# Patient Record
Sex: Male | Born: 1950 | Race: Black or African American | Hispanic: No | Smoking: Never smoker
Health system: Southern US, Community
[De-identification: ages and names within clinical notes are randomized; demographics above are authoritative.]

## PROBLEM LIST (undated history)

## (undated) DIAGNOSIS — I509 Heart failure, unspecified: Secondary | ICD-10-CM

## (undated) DIAGNOSIS — I10 Essential (primary) hypertension: Secondary | ICD-10-CM

## (undated) DIAGNOSIS — I4891 Unspecified atrial fibrillation: Secondary | ICD-10-CM

## (undated) DIAGNOSIS — E119 Type 2 diabetes mellitus without complications: Secondary | ICD-10-CM

## (undated) DIAGNOSIS — I5020 Unspecified systolic (congestive) heart failure: Secondary | ICD-10-CM

## (undated) DIAGNOSIS — I499 Cardiac arrhythmia, unspecified: Secondary | ICD-10-CM

---

## 1999-05-23 ENCOUNTER — Emergency Department (HOSPITAL_COMMUNITY): Admission: EM | Admit: 1999-05-23 | Discharge: 1999-05-23 | Payer: Self-pay | Admitting: Emergency Medicine

## 2005-08-13 ENCOUNTER — Emergency Department (HOSPITAL_COMMUNITY): Admission: EM | Admit: 2005-08-13 | Discharge: 2005-08-13 | Payer: Self-pay | Admitting: Family Medicine

## 2007-02-18 ENCOUNTER — Inpatient Hospital Stay (HOSPITAL_COMMUNITY): Admission: EM | Admit: 2007-02-18 | Discharge: 2007-02-22 | Payer: Self-pay | Admitting: Emergency Medicine

## 2007-02-22 ENCOUNTER — Inpatient Hospital Stay (HOSPITAL_COMMUNITY): Admission: AD | Admit: 2007-02-22 | Discharge: 2007-02-27 | Payer: Self-pay | Admitting: Psychiatry

## 2007-02-22 ENCOUNTER — Ambulatory Visit: Payer: Self-pay | Admitting: Psychiatry

## 2008-02-04 ENCOUNTER — Emergency Department (HOSPITAL_COMMUNITY): Admission: AC | Admit: 2008-02-04 | Discharge: 2008-02-04 | Payer: Self-pay

## 2008-02-05 ENCOUNTER — Ambulatory Visit: Payer: Self-pay | Admitting: Psychiatry

## 2008-02-05 ENCOUNTER — Inpatient Hospital Stay (HOSPITAL_COMMUNITY): Admission: AD | Admit: 2008-02-05 | Discharge: 2008-03-01 | Payer: Self-pay | Admitting: Psychiatry

## 2008-03-13 ENCOUNTER — Emergency Department (HOSPITAL_COMMUNITY): Admission: EM | Admit: 2008-03-13 | Discharge: 2008-03-13 | Payer: Self-pay | Admitting: Emergency Medicine

## 2008-03-17 ENCOUNTER — Emergency Department (HOSPITAL_COMMUNITY): Admission: EM | Admit: 2008-03-17 | Discharge: 2008-03-17 | Payer: Self-pay | Admitting: Emergency Medicine

## 2008-04-12 ENCOUNTER — Emergency Department (HOSPITAL_COMMUNITY): Admission: EM | Admit: 2008-04-12 | Discharge: 2008-04-12 | Payer: Self-pay | Admitting: Emergency Medicine

## 2008-04-24 ENCOUNTER — Emergency Department (HOSPITAL_COMMUNITY): Admission: EM | Admit: 2008-04-24 | Discharge: 2008-04-24 | Payer: Self-pay | Admitting: Emergency Medicine

## 2008-04-27 ENCOUNTER — Emergency Department (HOSPITAL_COMMUNITY): Admission: EM | Admit: 2008-04-27 | Discharge: 2008-04-27 | Payer: Self-pay | Admitting: Emergency Medicine

## 2008-04-30 ENCOUNTER — Emergency Department (HOSPITAL_COMMUNITY): Admission: EM | Admit: 2008-04-30 | Discharge: 2008-04-30 | Payer: Self-pay | Admitting: Emergency Medicine

## 2008-05-12 ENCOUNTER — Emergency Department (HOSPITAL_COMMUNITY): Admission: EM | Admit: 2008-05-12 | Discharge: 2008-05-12 | Payer: Self-pay | Admitting: Emergency Medicine

## 2008-05-25 ENCOUNTER — Emergency Department (HOSPITAL_COMMUNITY): Admission: EM | Admit: 2008-05-25 | Discharge: 2008-05-25 | Payer: Self-pay | Admitting: Emergency Medicine

## 2008-06-02 ENCOUNTER — Emergency Department (HOSPITAL_COMMUNITY): Admission: EM | Admit: 2008-06-02 | Discharge: 2008-06-02 | Payer: Self-pay | Admitting: Emergency Medicine

## 2008-06-06 ENCOUNTER — Ambulatory Visit: Payer: Self-pay | Admitting: Internal Medicine

## 2008-06-07 ENCOUNTER — Ambulatory Visit: Payer: Self-pay | Admitting: Internal Medicine

## 2008-06-10 ENCOUNTER — Ambulatory Visit: Payer: Self-pay | Admitting: Internal Medicine

## 2008-06-11 ENCOUNTER — Ambulatory Visit: Payer: Self-pay | Admitting: *Deleted

## 2008-06-11 ENCOUNTER — Emergency Department (HOSPITAL_COMMUNITY): Admission: EM | Admit: 2008-06-11 | Discharge: 2008-06-11 | Payer: Self-pay | Admitting: Emergency Medicine

## 2008-06-14 ENCOUNTER — Ambulatory Visit: Payer: Self-pay | Admitting: Internal Medicine

## 2008-06-26 ENCOUNTER — Ambulatory Visit: Payer: Self-pay | Admitting: Internal Medicine

## 2008-07-03 ENCOUNTER — Ambulatory Visit: Payer: Self-pay | Admitting: Internal Medicine

## 2008-07-31 ENCOUNTER — Ambulatory Visit: Payer: Self-pay | Admitting: Internal Medicine

## 2008-08-21 ENCOUNTER — Ambulatory Visit: Payer: Self-pay | Admitting: Internal Medicine

## 2008-11-20 ENCOUNTER — Ambulatory Visit: Payer: Self-pay | Admitting: Internal Medicine

## 2008-11-20 LAB — CONVERTED CEMR LAB
AST: 21 units/L (ref 0–37)
Albumin: 4.1 g/dL (ref 3.5–5.2)
Amphetamine Screen, Ur: NEGATIVE
Benzodiazepines.: NEGATIVE
Calcium: 9.2 mg/dL (ref 8.4–10.5)
Cholesterol: 155 mg/dL (ref 0–200)
Cocaine Metabolites: NEGATIVE
Glucose, Bld: 271 mg/dL — ABNORMAL HIGH (ref 70–99)
Marijuana Metabolite: NEGATIVE
Methadone: NEGATIVE
Microalb, Ur: 3.34 mg/dL — ABNORMAL HIGH (ref 0.00–1.89)
Opiate Screen, Urine: NEGATIVE
Phencyclidine (PCP): NEGATIVE
Sodium: 137 meq/L (ref 135–145)

## 2008-11-27 ENCOUNTER — Ambulatory Visit: Payer: Self-pay | Admitting: Internal Medicine

## 2008-12-30 ENCOUNTER — Ambulatory Visit: Payer: Self-pay | Admitting: Internal Medicine

## 2009-01-06 ENCOUNTER — Encounter (INDEPENDENT_AMBULATORY_CARE_PROVIDER_SITE_OTHER): Payer: Self-pay | Admitting: Family Medicine

## 2009-01-06 ENCOUNTER — Ambulatory Visit: Payer: Self-pay | Admitting: Internal Medicine

## 2009-01-10 ENCOUNTER — Ambulatory Visit: Payer: Self-pay | Admitting: Internal Medicine

## 2010-08-31 LAB — POCT I-STAT, CHEM 8
BUN: 17 mg/dL (ref 6–23)
Calcium, Ion: 1.16 mmol/L (ref 1.12–1.32)
Chloride: 104 mEq/L (ref 96–112)
Glucose, Bld: 183 mg/dL — ABNORMAL HIGH (ref 70–99)
Hemoglobin: 12.9 g/dL — ABNORMAL LOW (ref 13.0–17.0)
Potassium: 4 mEq/L (ref 3.5–5.1)
Sodium: 139 mEq/L (ref 135–145)

## 2010-08-31 LAB — URINALYSIS, ROUTINE W REFLEX MICROSCOPIC
Glucose, UA: 100 mg/dL — AB
Hgb urine dipstick: NEGATIVE
Leukocytes, UA: NEGATIVE
Nitrite: NEGATIVE
Protein, ur: 30 mg/dL — AB
Urobilinogen, UA: 0.2 mg/dL (ref 0.0–1.0)
pH: 5.5 (ref 5.0–8.0)

## 2010-08-31 LAB — DIFFERENTIAL
Basophils Relative: 1 % (ref 0–1)
Basophils Relative: 1 % (ref 0–1)
Eosinophils Absolute: 0 10*3/uL (ref 0.0–0.7)
Eosinophils Relative: 0 % (ref 0–5)
Eosinophils Relative: 1 % (ref 0–5)
Lymphocytes Relative: 37 % (ref 12–46)
Monocytes Absolute: 0.5 10*3/uL (ref 0.1–1.0)
Monocytes Absolute: 0.5 10*3/uL (ref 0.1–1.0)
Monocytes Absolute: 0.4 10*3/uL (ref 0.1–1.0)
Monocytes Relative: 11 % (ref 3–12)
Monocytes Relative: 9 % (ref 3–12)
Neutro Abs: 3.2 10*3/uL (ref 1.7–7.7)
Neutrophils Relative %: 65 % (ref 43–77)
Neutrophils Relative %: 69 % (ref 43–77)

## 2010-08-31 LAB — CBC
HCT: 36 % — ABNORMAL LOW (ref 39.0–52.0)
Hemoglobin: 12 g/dL — ABNORMAL LOW (ref 13.0–17.0)
MCHC: 33.4 g/dL (ref 30.0–36.0)
MCHC: 33.6 g/dL (ref 30.0–36.0)
MCV: 86.9 fL (ref 78.0–100.0)
MCV: 85.8 fL (ref 78.0–100.0)
Platelets: 182 10*3/uL (ref 150–400)
Platelets: 203 10*3/uL (ref 150–400)
RBC: 4.42 MIL/uL (ref 4.22–5.81)
RBC: 4.14 MIL/uL — ABNORMAL LOW (ref 4.22–5.81)
RDW: 13.9 % (ref 11.5–15.5)

## 2010-08-31 LAB — COMPREHENSIVE METABOLIC PANEL
ALT: 27 U/L (ref 0–53)
ALT: 32 U/L (ref 0–53)
Alkaline Phosphatase: 84 U/L (ref 39–117)
Alkaline Phosphatase: 89 U/L (ref 39–117)
CO2: 25 mEq/L (ref 19–32)
CO2: 24 mEq/L (ref 19–32)
Calcium: 9.4 mg/dL (ref 8.4–10.5)
Chloride: 103 mEq/L (ref 96–112)
Chloride: 106 mEq/L (ref 96–112)
GFR calc Af Amer: >60 mL/min (ref >60)
Glucose, Bld: 250 mg/dL — ABNORMAL HIGH (ref 70–99)
Glucose, Bld: 214 mg/dL — ABNORMAL HIGH (ref 70–99)
Potassium: 3.9 mEq/L (ref 3.5–5.1)
Sodium: 136 mEq/L (ref 135–145)
Sodium: 138 mEq/L (ref 135–145)
Total Bilirubin: 0.8 mg/dL (ref 0.3–1.2)
Total Protein: 7.1 g/dL (ref 6.0–8.3)

## 2010-08-31 LAB — URINE MICROSCOPIC-ADD ON

## 2010-08-31 LAB — GLUCOSE, CAPILLARY: Glucose-Capillary: 252 mg/dL — ABNORMAL HIGH (ref 70–99)
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
## 2010-09-29 NOTE — Discharge Summary (Signed)
NAMECARNIE, [name]                  ACCOUNT NO.:  [id number]   MEDICAL RECORD NO.:  [id number]          PATIENT TYPE:  INP   LOCATION:  [id number]                         FACILITY:  [facility]   PHYSICIAN:  Neysa Bonito, MD  DATE OF BIRTH:  [birth year]   DATE OF ADMISSION:  02/18/2007  DATE OF DISCHARGE:  02/21/2007                               DISCHARGE SUMMARY   PRIMARY CARE PHYSICIAN:  Unassigned.   CHIEF COMPLAINT:  Hyperglycemia.   HISTORY OF PRESENT ILLNESS:  Please refer to the H&P dictated by  Mobolaji B. Maia Petties, M.D.,  on the day of admission.   HOSPITAL STAY:  Problem 1.  SUICIDAL IDEATION:  The patient was seen and evaluated by  Dr. Felizardo Hoffmann and he was deemed not suicidal, so the patient is  cleared from psychiatry to be discharged.   Problem 2.  HYPERGLYCEMIA:  The patient was found to have hyperglycemia  on lab test.  Actually, the patient's hemoglobin A1c is pain 10.5 at  this time.  The patient was started on Actos on the day of admission and  I Vidal continue Actos on the discharge.  I discussed with him the  importance of following up with the primary physician for further  management of his diabetes, and he is aware and agreed to the idea of  following up with a primary physician.  That was challenging because the  patient lives in a shelter home, [facility], and he stated he wanted  to go back to [facility], though assisted-living facility was offered  as an option for his discharge considering now he needs more medical  care and maybe assistance with his medication and his health.  But the  patient refused time and time again to be discharged to anywhere except  to [facility].  I discussed the case with the social worker and we  contacted [facility] to inform them of his new diagnosis of his  diabetes.   The patient [name] have a diabetic nurse educator before his discharge  today.   DISCHARGE DIAGNOSES:  1. New-onset diabetes.  2. Homelessness.   DISCHARGE MEDICATIONS:  Actos 30 mg p.o. daily.   DISCHARGE PLAN:  The [name] be discharged to a shelter today.  He  is agreed and aware of the need of follow-up for further management of  his diabetes and he refused the assisted living facility discharge plan  at the current time.     Neysa Bonito, MD  Electronically Signed    EME/MEDQ  D:  02/21/2007  T:  02/21/2007  Job:  [id number]
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
## 2010-09-29 NOTE — Discharge Summary (Signed)
NAMEEMITERIO, NAU                  ACCOUNT NO.:  [id number]   MEDICAL RECORD NO.:  [id number]          PATIENT TYPE:  IPS   LOCATION:  [id number]                          [facility]  [facility]   PHYSICIAN:  Norm Salt, MD  DATE OF BIRTH:  [birth year]   DATE OF ADMISSION:  02/05/2008  DATE OF DISCHARGE:  03/01/2008                               DISCHARGE SUMMARY   IDENTIFYING DATA/REASON FOR ADMISSION:  This was an inpatient  psychiatric admission for [name], a 60 year old unmarried, homeless,  [name].  He was admitted to the emergency department,  where he presented with severe symptoms of psychosis and delusional  thinking.  Although a little history was available at the time of his  admission, he apparently had a history of chronic schizophrenia that had  been very longstanding.  Please refer to the admission note for further  details pertaining to the symptoms, circumstances and history that led  to his hospitalization.  He was given an initial Axis I diagnosis of  schizophrenia, chronic paranoid type, acute exacerbation.   MEDICAL/LABORATORY:  The patient was medically and physically assessed  by the psychiatric nurse practitioner.  He had a history of diabetes  mellitus, hypertension, and GERD.  He was continued on his usual regimen  of Actos, Protonix, aspirin, and a multivitamin.  There were no acute  medical issues during this inpatient stay.  His diabetic management was  overseen by the psychiatric nurse practitioner and the pharmacist.   HOSPITAL COURSE:  The patient was admitted to the adult inpatient  psychiatric service.  He presented as a well-nourished, normally-  developed adult male who initially was completely mute.  Although he was  alert, made eye contact well, and communicated through gestures, he made  absolutely no verbal responses.  For instance, when I asked him his name  he pointed to his ID bracelet.  He smiled and looked quite cheerful.  However, he  was clearly very guarded and suspicious.  He was treated  with a psychotropic regimen that included Haldol and Seroquel.  Over  several days, he gradually became less withdrawn, guarded, and more  verbal.  During the last 10 days of his hospital stay, he communicated  in a verbal fashion quite normally, and appeared to be completely non  delusional, with good reality testing, and insight into his illness and  need for treatment.   The patient's inpatient stay was lengthened by the challenges that we  found in disposition planning.  Because the patient had no form of  funding available, it was not possible to arrange for a group home or  assisted living situation, which was our first choice of plans.  The  patient had at times lived with his adult son, but the son and indicated  to [place] earlier in the patient's stay that he did not feel that he could  adequately manage the patient's resources, based upon the resources that  the son had available to him.  However, in the course of his treatment,  the patient cleared significantly, to a greater extent than I  believe  his family thought possible.  As such, his son was willing to take him  back to his home following his discharge.  He was discharged after  approximately 26 days of inpatient treatment.   AFTERCARE:  The patient was to follow up at the [facility], with an appointment on March 04, 2008.   DISCHARGE MEDICATIONS:  1. Haldol 20 mg nightly.  2. Seroquel 200 mg nightly.  3. Actos 30 mg daily.  4. Protonix 40 mg daily.  5. Aspirin 81 mg daily.  6. Multivitamin daily.   DISCHARGE DIAGNOSES:  Axis I:  Schizophrenia, chronic paranoid-type,  acute exacerbation, resolving.  Axis II:  Deferred.  Axis III:  History of diabetes, hypertension, gastroesophageal reflux  disease.  Axis IV:  Stressors severe.  Axis V: GAF on discharge 55.      Norm Salt, MD  Electronically Signed     SPB/MEDQ  D:   03/07/2008  T:  03/07/2008  Job:  [id number]
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
## 2010-09-29 NOTE — Consult Note (Signed)
NAMETROYCE, SWEE                  ACCOUNT NO.:  [id number]   MEDICAL RECORD NO.:  [id number]          PATIENT TYPE:  INP   LOCATION:  1509                         FACILITY:  [facility]   PHYSICIAN:  Felizardo Hoffmann, M.D.  DATE OF BIRTH:  [birth year]   DATE OF CONSULTATION:  02/21/2007  DATE OF DISCHARGE:                                 CONSULTATION   FOLLOWUP CONSULTATION   Mr. Lucatero began pacing up and down the hallway at a very rapid speed,  making occasional short 90-degree turns to the left and then back to the  right, keeping up his same direction.  Therapists trying to reach other  patients such as respiratory therapy and family members as well as  patients who were out in the hallway were at risk and intimidated.  He  was maintaining his pacing regardless of any attempt to redirect.  Just  prior to the beginning of the pacing, the patient had been given a form  describing a local general medical clinic for the indigent.  He was  holding the paper in his hands, staring at it as he was pacing.   The patient has been stating religious delusions.  He clearly is  demonstrating impaired judgment.  He continues grossly to be oriented.  He does indicate that he can remember events of the day.   MENTAL STATUS EXAM:  As above, the patient is alert.  His mood is very  expansive.  He is intrusive and imposing.  He Geno not respond to verbal  redirection back to his room.  His judgment is impaired.  Thought  process involves racing.  Thought content:  Grandiosity, religious  delusion.   ASSESSMENT:  1. 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  2. Rule out 296.80 bipolar disorder not otherwise specified, manic.   RECOMMENDATION:  1. In order to prevent harm and help the patient avoid potential      lethal self-neglect outside of the hospital (due to impaired      judgment), we Kayshawn order Haldol 5 mg with 4 mg intramuscularly, may      repeat x1.  2.  Johanthan petition for commitment to a psychiatric hospital once      medically cleared.  3. Low-stimulation ego support to establish a therapeutic alliance.  4. Would begin 5 mg Haldol p.o. or IM b.i.d. standing for      antipsychosis if this dosing of Haldol is agreed to by physician      for antipsychosis.  (The above dosing of Haldol 5 mg, 4 mg of      Ativan, is to reduce acute severe agitation and acute threat to      others as well as preventing the patient from exposing himself to      potential lethal self-neglect.  Therefore, that order Kevante not      require a second opinion acutely.)  5. Regarding a p.r.n. that Jobe be required for acute anti-      agitation/anticombativeness, would continue with Haldol 5 mg and  Ativan 3 mg IM q.2h. p.r.n.      Felizardo Hoffmann, M.D.  Electronically Signed    JW/MEDQ  D:  02/21/2007  T:  02/21/2007  Job:  [id number]
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
## 2010-10-02 NOTE — Discharge Summary (Signed)
NAMEBENET, KITCHELL                  ACCOUNT NO.:  [id number]   MEDICAL RECORD NO.:  [id number]          PATIENT TYPE:  IPS   LOCATION:  0404                          FACILITY:  [facility]   PHYSICIAN:  Norm Salt, MD  DATE OF BIRTH:  [birth year]   DATE OF ADMISSION:  02/22/2007  DATE OF DISCHARGE:  02/27/2007                               DISCHARGE SUMMARY   IDENTIFYING DATA/REASON FOR ADMISSION:  This was an inpatient  psychiatric admission for [name], a 60 year old African-American male,  homeless, who presented to the emergency department last week and was  admitted for treatment and evaluation of premature ventricular  contractions and uncontrolled diabetes.  While there, abnormal behavior  was noted, and the psychiatric consultant was called in.  The patient  had been anxious, pacing, had racing thoughts, religious delusions, and  was intimidating others in the medical hospital.  Please refer to the  admission note for further details pertaining to the symptoms,  circumstances and history that led to his hospitalization.   INITIAL DIAGNOSTIC IMPRESSION:  He was given an initial AXIS I diagnosis  of rule out schizophrenia, paranoid type.   MEDICAL/LABORATORY:  The patient was medically cleared at the hospital  prior to transfer to his inpatient stay with [place].  He was again reviewed  by the psychiatric nurse practitioner upon admission and continued on a  regimen of Actos 30 mg daily, Protonix 40 mg daily, aspirin 81 mg daily,  and an insulin regimen that was monitored by the pharmacist and the  nurse practitioner throughout his stay.  There were no acute medical  issues.   HOSPITAL COURSE:  The patient presented as a well-nourished, well-  developed male who was superficially pleasant and relaxed.  He was not  oriented to his situation, although he was oriented to person, place and  date.  When it was explained to him that this was a psychiatric unit, he  indicated that he had no  idea why he had been brought here.  He denied  any psychiatric history whatsoever, and denied any and all medical  problems.  He made no delusional statements, but the above denials  indicated extreme impairment, or at the very least, extreme guardedness,  or possibly conscious withholding of information.   The patient was placed on a trial of Haldol 5 mg b.i.d.  He was involved  in the therapeutic milieu.  He continued pleasant and cooperative  throughout his stay, and was a reasonably good participant in the  treatment program.  He never demonstrated any overt psychotic behaviors  or thoughts or statements.  It was felt that his insight and judgment  continued impaired.   Sleeping and eating stabilized rapidly.  We learned that he had been  staying at the homeless shelter, the [facility].  After the sixth  hospital day, the patient appeared quite stable, without any overt signs  or symptoms of psychosis, was pleasant, cooperative with medication, and  agreed to the following aftercare plan.   AFTERCARE:  The patient was to follow up at the [facility] with an  appointment on February 28, 2007 with their psychiatrist.  He was to  return to the shelter.   DISCHARGE MEDICATIONS:  1. Haldol 5 mg b.i.d.  2. Actos 30 mg daily.  3. Protonix 40 mg daily.  4. Aspirin 81 mg daily.  5. Insulin regimen as before.   DISCHARGE DIAGNOSES:  AXIS I:  Schizophrenia, chronic, paranoid-type,  acute exacerbation, resolving.  AXIS II:  Deferred.  AXIS III:  History of gastroesophageal reflux disease, coronary artery  disease, insulin-dependent diabetes mellitus.  AXIS IV:  Stressors:  Severe.  AXIS V:  GAF on discharge 55.      Norm Salt, MD  Electronically Signed     SPB/MEDQ  D:  02/28/2007  T:  02/28/2007  Job:  [id number]

## 2011-02-15 LAB — URINALYSIS, ROUTINE W REFLEX MICROSCOPIC
Glucose, UA: 500 — AB
Hgb urine dipstick: NEGATIVE
Ketones, ur: NEGATIVE
Leukocytes, UA: NEGATIVE
Leukocytes, UA: NEGATIVE
Nitrite: NEGATIVE
Protein, ur: 30 — AB
Protein, ur: NEGATIVE
Specific Gravity, Urine: 1.017
Urobilinogen, UA: 0.2
pH: 6

## 2011-02-15 LAB — ETHANOL: Alcohol, Ethyl (B): <5

## 2011-02-15 LAB — CBC
RBC: 4.17 — ABNORMAL LOW
WBC: 6.6

## 2011-02-15 LAB — RAPID URINE DRUG SCREEN, HOSP PERFORMED
Amphetamines: NOT DETECTED
Barbiturates: NOT DETECTED
Cocaine: NOT DETECTED
Opiates: NOT DETECTED
Tetrahydrocannabinol: NOT DETECTED

## 2011-02-15 LAB — POCT I-STAT, CHEM 8
Calcium, Ion: 1.09 — ABNORMAL LOW
Chloride: 106
Creatinine, Ser: 1.1
Glucose, Bld: 215 — ABNORMAL HIGH
HCT: 37 — ABNORMAL LOW

## 2011-02-15 LAB — DIFFERENTIAL
Basophils Relative: 0
Lymphs Abs: 1.2
Monocytes Relative: 9
Neutro Abs: 4.8
Neutrophils Relative %: 73

## 2011-02-15 LAB — URINE MICROSCOPIC-ADD ON

## 2011-02-15 LAB — GLUCOSE, CAPILLARY: Glucose-Capillary: 249 — ABNORMAL HIGH

## 2011-02-16 LAB — GLUCOSE, CAPILLARY
Glucose-Capillary: 142 — ABNORMAL HIGH
Glucose-Capillary: 146 — ABNORMAL HIGH
Glucose-Capillary: 146 — ABNORMAL HIGH
Glucose-Capillary: 150 — ABNORMAL HIGH
Glucose-Capillary: 151 — ABNORMAL HIGH
Glucose-Capillary: 152 — ABNORMAL HIGH
Glucose-Capillary: 164 — ABNORMAL HIGH
Glucose-Capillary: 169 — ABNORMAL HIGH
Glucose-Capillary: 175 — ABNORMAL HIGH
Glucose-Capillary: 177 — ABNORMAL HIGH
Glucose-Capillary: 181 — ABNORMAL HIGH
Glucose-Capillary: 184 — ABNORMAL HIGH
Glucose-Capillary: 186 — ABNORMAL HIGH
Glucose-Capillary: 187 — ABNORMAL HIGH
Glucose-Capillary: 187 — ABNORMAL HIGH
Glucose-Capillary: 200 — ABNORMAL HIGH
Glucose-Capillary: 201 — ABNORMAL HIGH
Glucose-Capillary: 203 — ABNORMAL HIGH
Glucose-Capillary: 205 — ABNORMAL HIGH
Glucose-Capillary: 214 — ABNORMAL HIGH
Glucose-Capillary: 219 — ABNORMAL HIGH
Glucose-Capillary: 224 — ABNORMAL HIGH
Glucose-Capillary: 224 — ABNORMAL HIGH
Glucose-Capillary: 227 — ABNORMAL HIGH
Glucose-Capillary: 245 — ABNORMAL HIGH
Glucose-Capillary: 256 — ABNORMAL HIGH
Glucose-Capillary: 257 — ABNORMAL HIGH
Glucose-Capillary: 261 — ABNORMAL HIGH
Glucose-Capillary: 263 — ABNORMAL HIGH
Glucose-Capillary: 265 — ABNORMAL HIGH
Glucose-Capillary: 299 — ABNORMAL HIGH
Glucose-Capillary: 307 — ABNORMAL HIGH
Glucose-Capillary: 319 — ABNORMAL HIGH
Glucose-Capillary: 342 — ABNORMAL HIGH
Glucose-Capillary: 363 — ABNORMAL HIGH
Glucose-Capillary: 209 — ABNORMAL HIGH

## 2011-02-16 LAB — COMPREHENSIVE METABOLIC PANEL
ALT: 29
AST: 27
CO2: 27
Calcium: 9.4
GFR calc Af Amer: >60
Potassium: 3.7
Sodium: 138
Total Protein: 7

## 2011-02-16 LAB — URINALYSIS, ROUTINE W REFLEX MICROSCOPIC
Glucose, UA: 500 — AB
Glucose, UA: NEGATIVE
Ketones, ur: NEGATIVE
Nitrite: NEGATIVE
Nitrite: NEGATIVE
Protein, ur: NEGATIVE
Specific Gravity, Urine: 1.013
Urobilinogen, UA: 0.2
pH: 6

## 2011-02-16 LAB — DIFFERENTIAL
Eosinophils Absolute: 0.1
Eosinophils Relative: 1
Lymphs Abs: 1.8
Monocytes Absolute: 0.5
Monocytes Relative: 10

## 2011-02-16 LAB — ETHANOL: Alcohol, Ethyl (B): <5

## 2011-02-16 LAB — POCT I-STAT, CHEM 8
BUN: 7
Potassium: 3.8
Sodium: 139
TCO2: 26

## 2011-02-16 LAB — RAPID URINE DRUG SCREEN, HOSP PERFORMED
Barbiturates: NOT DETECTED
Benzodiazepines: NOT DETECTED

## 2011-02-16 LAB — CBC
MCHC: 33.6
RBC: 4.3
RDW: 13.9

## 2011-02-19 LAB — URINALYSIS, ROUTINE W REFLEX MICROSCOPIC
Nitrite: NEGATIVE
Specific Gravity, Urine: 1.015 (ref 1.005–1.030)
Urobilinogen, UA: 0.2 mg/dL (ref 0.0–1.0)
pH: 5.5 (ref 5.0–8.0)

## 2011-02-19 LAB — DIFFERENTIAL
Basophils Absolute: 0 10*3/uL (ref 0.0–0.1)
Basophils Relative: 0 % (ref 0–1)
Eosinophils Absolute: 0 10*3/uL (ref 0.0–0.7)
Eosinophils Relative: 0 % (ref 0–5)
Lymphocytes Relative: 11 % — ABNORMAL LOW (ref 12–46)
Lymphs Abs: 0.7 10*3/uL (ref 0.7–4.0)
Lymphs Abs: 1 10*3/uL (ref 0.7–4.0)
Monocytes Relative: 8 % (ref 3–12)
Neutro Abs: 6.8 10*3/uL (ref 1.7–7.7)
Neutrophils Relative %: 77 % (ref 43–77)

## 2011-02-19 LAB — CBC
HCT: 37.8 % — ABNORMAL LOW (ref 39.0–52.0)
HCT: 38.2 % — ABNORMAL LOW (ref 39.0–52.0)
MCHC: 33.4 g/dL (ref 30.0–36.0)
MCV: 87.6 fL (ref 78.0–100.0)
Platelets: 173 10*3/uL (ref 150–400)
Platelets: 235 10*3/uL (ref 150–400)
RDW: 13.5 % (ref 11.5–15.5)
WBC: 5.7 10*3/uL (ref 4.0–10.5)
WBC: 8.9 10*3/uL (ref 4.0–10.5)

## 2011-02-19 LAB — POCT CARDIAC MARKERS
CKMB, poc: 1.8 ng/mL (ref 1.0–8.0)
Myoglobin, poc: 228 ng/mL (ref 12–200)
Troponin i, poc: <0.05 ng/mL (ref 0.00–0.09)

## 2011-02-19 LAB — POCT I-STAT, CHEM 8
BUN: 12 mg/dL (ref 6–23)
Chloride: 101 mEq/L (ref 96–112)
Creatinine, Ser: 1.2 mg/dL (ref 0.4–1.5)
Potassium: 3.9 mEq/L (ref 3.5–5.1)
Sodium: 137 mEq/L (ref 135–145)
TCO2: 27 mmol/L (ref 0–100)

## 2011-02-19 LAB — RAPID URINE DRUG SCREEN, HOSP PERFORMED
Amphetamines: NOT DETECTED
Tetrahydrocannabinol: NOT DETECTED

## 2011-02-19 LAB — GLUCOSE, CAPILLARY: Glucose-Capillary: 294 mg/dL — ABNORMAL HIGH (ref 70–99)

## 2011-02-19 LAB — BASIC METABOLIC PANEL
BUN: 7 mg/dL (ref 6–23)
CO2: 27 mEq/L (ref 19–32)
Chloride: 103 mEq/L (ref 96–112)
Potassium: 4.1 mEq/L (ref 3.5–5.1)

## 2011-02-19 LAB — ETHANOL: Alcohol, Ethyl (B): <5 mg/dL (ref 0–10)

## 2011-02-25 LAB — LIPID PANEL
Cholesterol: 132
HDL: 41

## 2011-02-25 LAB — CBC
HCT: 35.3 — ABNORMAL LOW
HCT: 37.8 — ABNORMAL LOW
Hemoglobin: 12.9 — ABNORMAL LOW
MCHC: 34.2
MCV: 84.3
Platelets: 174
Platelets: 204
RBC: 4.53
WBC: 3.5 — ABNORMAL LOW
WBC: 7.1

## 2011-02-25 LAB — CK TOTAL AND CKMB (NOT AT ARMC)
CK, MB: 2
CK, MB: 2.4
Relative Index: 0.5
Total CK: 501 — ABNORMAL HIGH
Total CK: 533 — ABNORMAL HIGH

## 2011-02-25 LAB — URINALYSIS, ROUTINE W REFLEX MICROSCOPIC
Leukocytes, UA: NEGATIVE
Nitrite: NEGATIVE
Protein, ur: NEGATIVE
Urobilinogen, UA: 0.2

## 2011-02-25 LAB — COMPREHENSIVE METABOLIC PANEL
Albumin: 3.2 — ABNORMAL LOW
Albumin: 3.8
Alkaline Phosphatase: 128 — ABNORMAL HIGH
BUN: 5 — ABNORMAL LOW
BUN: 8
CO2: 29
Calcium: 8.9
Chloride: 104
Chloride: 105
Creatinine, Ser: 0.95
GFR calc non Af Amer: >60
Potassium: 3.9
Total Bilirubin: 0.6
Total Bilirubin: 0.7

## 2011-02-25 LAB — DIFFERENTIAL
Basophils Absolute: 0
Basophils Relative: 0
Eosinophils Relative: 0
Monocytes Absolute: 0.5
Neutro Abs: 5.8

## 2011-02-25 LAB — ETHANOL: Alcohol, Ethyl (B): <5

## 2011-02-25 LAB — DRUGS OF ABUSE SCREEN W/O ALC, ROUTINE URINE
Amphetamine Screen, Ur: NEGATIVE
Barbiturate Quant, Ur: NEGATIVE
Marijuana Metabolite: NEGATIVE
Propoxyphene: NEGATIVE

## 2011-02-25 LAB — RAPID URINE DRUG SCREEN, HOSP PERFORMED: Benzodiazepines: NOT DETECTED

## 2011-02-25 LAB — TSH: TSH: 2.156

## 2011-02-25 LAB — URINE MICROSCOPIC-ADD ON: Urine-Other: NONE SEEN

## 2011-02-25 LAB — TROPONIN I: Troponin I: 0.03

## 2013-12-20 ENCOUNTER — Encounter (HOSPITAL_COMMUNITY): Payer: Self-pay | Admitting: Emergency Medicine

## 2013-12-20 ENCOUNTER — Emergency Department (HOSPITAL_COMMUNITY)
Admission: EM | Admit: 2013-12-20 | Discharge: 2013-12-20 | Disposition: A | Discharge status: Home / Self Care | Payer: Self-pay | Attending: Emergency Medicine | Admitting: Emergency Medicine

## 2013-12-20 DIAGNOSIS — I10 Essential (primary) hypertension: Secondary | ICD-10-CM | POA: Insufficient documentation

## 2013-12-20 DIAGNOSIS — R51 Headache: Secondary | ICD-10-CM | POA: Insufficient documentation

## 2013-12-20 DIAGNOSIS — E119 Type 2 diabetes mellitus without complications: Secondary | ICD-10-CM | POA: Insufficient documentation

## 2013-12-20 DIAGNOSIS — Z79899 Other long term (current) drug therapy: Secondary | ICD-10-CM | POA: Insufficient documentation

## 2013-12-20 DIAGNOSIS — IMO0002 Reserved for concepts with insufficient information to code with codable children: Secondary | ICD-10-CM

## 2013-12-20 DIAGNOSIS — E1165 Type 2 diabetes mellitus with hyperglycemia: Secondary | ICD-10-CM

## 2013-12-20 LAB — CBC WITH DIFFERENTIAL/PLATELET
BASOS PCT: 0 % (ref 0–1)
Basophils Absolute: 0 10*3/uL (ref 0.0–0.1)
EOS ABS: 0 10*3/uL (ref 0.0–0.7)
EOS PCT: 0 % (ref 0–5)
HCT: 36.5 % — ABNORMAL LOW (ref 39.0–52.0)
Hemoglobin: 12.4 g/dL — ABNORMAL LOW (ref 13.0–17.0)
Lymphocytes Relative: 25 % (ref 12–46)
Lymphs Abs: 1.1 10*3/uL (ref 0.7–4.0)
MCH: 28.8 pg (ref 26.0–34.0)
MCHC: 34 g/dL (ref 30.0–36.0)
MCV: 84.9 fL (ref 78.0–100.0)
Monocytes Absolute: 0.5 10*3/uL (ref 0.1–1.0)
Monocytes Relative: 12 % (ref 3–12)
NEUTROS PCT: 63 % (ref 43–77)
Neutro Abs: 2.8 10*3/uL (ref 1.7–7.7)
PLATELETS: 176 10*3/uL (ref 150–400)
RBC: 4.3 MIL/uL (ref 4.22–5.81)
RDW: 13 % (ref 11.5–15.5)
WBC: 4.4 10*3/uL (ref 4.0–10.5)

## 2013-12-20 LAB — URINALYSIS, ROUTINE W REFLEX MICROSCOPIC
Bilirubin Urine: NEGATIVE
HGB URINE DIPSTICK: NEGATIVE
KETONES UR: 15 mg/dL — AB
LEUKOCYTES UA: NEGATIVE
Nitrite: NEGATIVE
PROTEIN: 100 mg/dL — AB
Specific Gravity, Urine: 1.035 — ABNORMAL HIGH (ref 1.005–1.030)
UROBILINOGEN UA: 0.2 mg/dL (ref 0.0–1.0)
pH: 5 (ref 5.0–8.0)

## 2013-12-20 LAB — BASIC METABOLIC PANEL
Anion gap: 13 (ref 5–15)
BUN: 16 mg/dL (ref 6–23)
CALCIUM: 9.3 mg/dL (ref 8.4–10.5)
CO2: 24 mEq/L (ref 19–32)
Chloride: 99 mEq/L (ref 96–112)
Creatinine, Ser: 1.14 mg/dL (ref 0.50–1.35)
GFR, EST AFRICAN AMERICAN: 77 mL/min — AB (ref >90)
GFR, EST NON AFRICAN AMERICAN: 67 mL/min — AB (ref >90)
Glucose, Bld: 326 mg/dL — ABNORMAL HIGH (ref 70–99)
POTASSIUM: 4.3 meq/L (ref 3.7–5.3)
SODIUM: 136 meq/L — AB (ref 137–147)

## 2013-12-20 LAB — URINE MICROSCOPIC-ADD ON

## 2013-12-20 MED ORDER — HYDROCHLOROTHIAZIDE 12.5 MG PO TABS: 12.5000 mg | ORAL_TABLET | Freq: Every day | ORAL | Status: DC

## 2013-12-20 MED ORDER — HYDROCHLOROTHIAZIDE 25 MG PO TABS
25.0000 mg | ORAL_TABLET | Freq: Every day | ORAL | Status: DC
Start: 2013-12-20 — End: 2013-12-20
  Administered 2013-12-20: 25 mg via ORAL
  Filled 2013-12-20: qty 1

## 2013-12-20 MED ORDER — METFORMIN HCL 1000 MG PO TABS: 500.0000 mg | ORAL_TABLET | Freq: Two times a day (BID) | ORAL | Status: DC

## 2013-12-20 NOTE — Progress Notes (Signed)
  CARE MANAGEMENT ED NOTE 12/20/2013  Patient:  [name],[name]   Account Number:  [id number]  Date Initiated:  12/20/2013  Documentation initiated by:  Jackelyn Poling  Subjective/Objective Assessment:   63 yr old self pay [place] pt at [facility] ED on 12/20/13 Pt reports went to health dept and sent here due to hypertension. States off of BP meds for two years. Reports headache and upper back pain.     Subjective/Objective Assessment Detail:   [facility] ED PA requesting pcp assistance for the pt  Pt [facility]  Pt reports his preference is to get medicaid renew  Reports change of address to [address] [place] and reports DSS may not have his new address  Pt verified home number correct as [phone] so Cm may call him back     Action/Plan:   ED CM spoke with ED PA, Kaitlyn about need for a pcp for pt ED CM spoke with pt via cell phone Cm discussed opening of [facility] & no longer accepting new pts at this time   Action/Plan Detail:   ED CM completed a referral to [facility] staff for assist with orange card and to [facility] for possible new pt appointment CM placed information for [facility] and DSS in d/c instructions for pt Pending return calls/emails for follow up with pt   Anticipated DC Date:  12/20/2013     Status Recommendation to Physician:   Result of Recommendation:    Other ED [place]  Other  Outpatient Services - Pt [place] follow up  PCP issues  [facility] / [facility] (established/new)    Choice offered to / List presented to:            Status of service:  Completed, signed off  ED Comments:   ED Comments Detail:  Information entered in EPIC follow u p section fof d/c [employer] of Social services  Call on 12/20/2013  [place], [place] [address] [phone]   Please call DSS to give them your updated address so they can send you another card and assign a family  doctor partnership for care  Call A referral has been made for you to this [place] for community care network assists with discounted doctors through "orange card services Call Sylvie Farrier at [phone] [email or web address]

## 2013-12-20 NOTE — ED Provider Notes (Signed)
CSN: [id number]     Arrival date & time 12/20/13  1037 History   First MD Initiated Contact with Patient 12/20/13 1134     Chief Complaint  Patient presents with  . Hypertension  . Headache     (Consider location/radiation/quality/duration/timing/severity/associated sxs/prior Treatment) HPI Comments: Patient is a 63 year old male with no past medical history who presents from the health department due to elevated BP. Patient initially reported a headache but it has now resolved without intervention. Patient denies any symptoms at this time. He reports going to the health department because he was having some genital irritation from a recent sexual encounter. He reports being very worried and upset that the woman he was intimate with transferred this infection. He thinks this is causing his blood pressure to be elevated. Patient reports taking herbs for BP.   Patient is a 63 y.o. male presenting with hypertension and headaches. The history is provided by the patient. No language interpreter was used.  Hypertension This is a recurrent problem. The current episode started today. The problem occurs constantly. The problem has been unchanged. Associated symptoms include headaches. Pertinent negatives include no abdominal pain, arthralgias, chest pain, chills, fatigue, fever, nausea, neck pain, vomiting or weakness. Nothing aggravates the symptoms. He has tried nothing for the symptoms. The treatment provided no relief.  Headache Associated symptoms: no abdominal pain, no diarrhea, no dizziness, no fatigue, no fever, no nausea, no neck pain and no vomiting     History reviewed. No pertinent past medical history. History reviewed. No pertinent past surgical history. No family history on file. History  Substance Use Topics  . Smoking status: Never Smoker   . Smokeless tobacco: Not on file  . Alcohol Use: No    Review of Systems  Constitutional: Negative for fever, chills and fatigue.  HENT:  Negative for trouble swallowing.   Eyes: Negative for visual disturbance.  Respiratory: Negative for shortness of breath.   Cardiovascular: Negative for chest pain and palpitations.  Gastrointestinal: Negative for nausea, vomiting, abdominal pain and diarrhea.  Genitourinary: Negative for dysuria and difficulty urinating.  Musculoskeletal: Negative for arthralgias and neck pain.  Skin: Negative for color change.  Neurological: Positive for headaches. Negative for dizziness and weakness.  Psychiatric/Behavioral: Negative for dysphoric mood.      Allergies  Review of patient's allergies indicates no known allergies.  Home Medications   Prior to Admission medications   Medication Sig Start Date End Date Taking? Authorizing Provider  b complex vitamins tablet Take 1 tablet by mouth daily.   Yes Historical Provider, MD  Biotin 1000 MCG tablet Take 1,000 mcg by mouth daily.   Yes Historical Provider, MD  Cinnamon 500 MG capsule Take 1,000 mg by mouth daily.   Yes Historical Provider, MD  OVER THE COUNTER MEDICATION Take 1 packet by mouth daily. Diabetes packet of vitamins   Yes Historical Provider, MD  vitamin B-12 (CYANOCOBALAMIN) 1000 MCG tablet Take 1,000 mcg by mouth daily.   Yes Historical Provider, MD  vitamin E 400 UNIT capsule Take 400 Units by mouth daily.   Yes Historical Provider, MD   BP 175/99  Pulse 94  Temp(Src) 97.7 F (36.5 C) (Oral)  Resp 24  SpO2 97% Physical Exam  Nursing note and vitals reviewed. Constitutional: He is oriented to person, place, and time. He appears well-developed and well-nourished. No distress.  HENT:  Head: Normocephalic and atraumatic.  Eyes: Conjunctivae and EOM are normal.  Neck: Normal range of motion.  Cardiovascular:  Normal rate and regular rhythm.  Exam reveals no gallop and no friction rub.   No murmur heard. Pulmonary/Chest: Effort normal and breath sounds normal. He has no wheezes. He has no rales. He exhibits no tenderness.   Abdominal: Soft. He exhibits no distension. There is no tenderness. There is no rebound.  Musculoskeletal: Normal range of motion.  Neurological: He is alert and oriented to person, place, and time. No cranial nerve deficit. Coordination normal.  Speech is goal-oriented. Moves limbs without ataxia.   Skin: Skin is warm and dry.  Psychiatric: He has a normal mood and affect. His behavior is normal.    ED Course  Procedures (including critical care time) Labs Review Labs Reviewed  CBC WITH DIFFERENTIAL - Abnormal; Notable for the following:    Hemoglobin 12.4 (*)    HCT 36.5 (*)    All other components within normal limits  BASIC METABOLIC PANEL - Abnormal; Notable for the following:    Sodium 136 (*)    Glucose, Bld 326 (*)    GFR calc non Af Amer 67 (*)    GFR calc Af Amer 77 (*)    All other components within normal limits  URINALYSIS, ROUTINE W REFLEX MICROSCOPIC - Abnormal; Notable for the following:    Specific Gravity, Urine 1.035 (*)    Glucose, UA >1000 (*)    Ketones, ur 15 (*)    Protein, ur 100 (*)    All other components within normal limits  URINE MICROSCOPIC-ADD ON - Abnormal; Notable for the following:    Casts HYALINE CASTS (*)    All other components within normal limits    Imaging Review No results found.   EKG Interpretation None      MDM   Final diagnoses:  Essential hypertension  Diabetes mellitus type 2, uncontrolled    12:46 PM Patient reports relief of his headache since he's been in the ED. Labs and urinalysis pending. Patient's BP staying around 189/105. Patient Jerman be observed.   2:00 PM Patient's glucose elevated at 326 with anion gap of 13. Patient is asymptomatic at this time. No other labs changes noted. Patient spoke with case management who Harald help him get a PCP. Patient Uziel be started on Metformin and HCTZ. Patient Hriday be discharged with PCP follow up. Patient Shadrach have HCTZ here. Patient instructed to return with worsening  or concerning symptoms.     Alvina Chou, PA-C 12/20/13 [phone]

## 2013-12-20 NOTE — ED Provider Notes (Signed)
Medical screening examination/treatment/procedure(s) were performed by non-physician practitioner and as supervising physician I was immediately available for consultation/collaboration.   EKG Interpretation None        Wandra Arthurs, MD 12/20/13 1459

## 2013-12-20 NOTE — ED Notes (Signed)
Pt undressed, in gown, on monitor, continuous pulse oximetry and blood pressure cuff 

## 2013-12-20 NOTE — ED Notes (Signed)
Pt reports went to health dept and sent here due to hypertension. States off of BP meds for two years. Reports headache and upper back pain.

## 2013-12-20 NOTE — Discharge Instructions (Signed)
Take metformin daily as directed. Take HCTZ daily as directed. Follow up with a primary care provider for medication management. Refer to attached documents for more information.

## 2013-12-20 NOTE — ED Notes (Signed)
Pt aware of need of urine specimen; pt handed an urinal to use when he can

## 2013-12-21 NOTE — Progress Notes (Signed)
8/7/151630 ED Cm called and checked on pt He reports he had been to [facility] today and to [facility] He ws informed by DSS that his medicaid card would be issued to his new address in 7 days He was able to updated his address with [facility] and obtain an appointment for 8/19 15 Pt states he obtained he bp medicine at $4 but his metformin cost $23 He had to get help with the $23 dollars and go back on 12/21/13 to pick it up Pt states he does not have money for his other prn medicine but wanted to get his primary medicines  Reports also taking "garlic tablets" Reports a one time visit to Dr Kennon Holter ($56) but did not get a clear dx Report later on finding he had only a yeast infection and not an STD as confirmed by health department Discussed with pt www.needymed.org and goodrx.com but pt states he does not have access to internet and never been to [employer] for internet access Encouraged him to have [facility] to work with him on the cost of his medicine especially metformin He agreed to f/u [employer] of services and resources rendered

## 2015-08-02 ENCOUNTER — Emergency Department (HOSPITAL_COMMUNITY)
Admission: EM | Admit: 2015-08-02 | Discharge: 2015-08-02 | Disposition: A | Discharge status: Home / Self Care | Payer: Medicare Other | Attending: Emergency Medicine | Admitting: Emergency Medicine

## 2015-08-02 ENCOUNTER — Emergency Department (INDEPENDENT_AMBULATORY_CARE_PROVIDER_SITE_OTHER)
Admission: EM | Admit: 2015-08-02 | Discharge: 2015-08-02 | Disposition: A | Discharge status: Nursing Home (Includes ICF) | Payer: Medicare Other | Source: Home / Self Care | Attending: Family Medicine | Admitting: Family Medicine

## 2015-08-02 ENCOUNTER — Encounter (HOSPITAL_COMMUNITY): Payer: Self-pay | Admitting: Emergency Medicine

## 2015-08-02 DIAGNOSIS — Y9389 Activity, other specified: Secondary | ICD-10-CM | POA: Diagnosis not present

## 2015-08-02 DIAGNOSIS — Y9289 Other specified places as the place of occurrence of the external cause: Secondary | ICD-10-CM | POA: Insufficient documentation

## 2015-08-02 DIAGNOSIS — E119 Type 2 diabetes mellitus without complications: Secondary | ICD-10-CM | POA: Diagnosis not present

## 2015-08-02 DIAGNOSIS — Z79899 Other long term (current) drug therapy: Secondary | ICD-10-CM | POA: Insufficient documentation

## 2015-08-02 DIAGNOSIS — X58XXXA Exposure to other specified factors, initial encounter: Secondary | ICD-10-CM | POA: Diagnosis not present

## 2015-08-02 DIAGNOSIS — Y998 Other external cause status: Secondary | ICD-10-CM | POA: Diagnosis not present

## 2015-08-02 DIAGNOSIS — R220 Localized swelling, mass and lump, head: Secondary | ICD-10-CM | POA: Diagnosis present

## 2015-08-02 DIAGNOSIS — T783XXA Angioneurotic edema, initial encounter: Secondary | ICD-10-CM | POA: Diagnosis not present

## 2015-08-02 DIAGNOSIS — I10 Essential (primary) hypertension: Secondary | ICD-10-CM | POA: Insufficient documentation

## 2015-08-02 DIAGNOSIS — Z7984 Long term (current) use of oral hypoglycemic drugs: Secondary | ICD-10-CM | POA: Diagnosis not present

## 2015-08-02 HISTORY — DX: Essential (primary) hypertension: I10

## 2015-08-02 HISTORY — DX: Type 2 diabetes mellitus without complications: E11.9

## 2015-08-02 MED ORDER — DIPHENHYDRAMINE HCL 50 MG/ML IJ SOLN
INTRAMUSCULAR | Status: AC
  Filled 2015-08-02: qty 1

## 2015-08-02 MED ORDER — EPINEPHRINE HCL 1 MG/ML IJ SOLN
INTRAMUSCULAR | Status: AC
  Filled 2015-08-02: qty 1

## 2015-08-02 MED ORDER — METHYLPREDNISOLONE SODIUM SUCC 125 MG IJ SOLR
INTRAMUSCULAR | Status: AC
  Filled 2015-08-02: qty 2

## 2015-08-02 MED ORDER — EPINEPHRINE HCL 1 MG/ML IJ SOLN
0.3000 mg | Freq: Once | INTRAMUSCULAR | Status: AC
  Administered 2015-08-02: 0.3 mg via INTRAMUSCULAR

## 2015-08-02 MED ORDER — DIPHENHYDRAMINE HCL 50 MG/ML IJ SOLN
25.0000 mg | Freq: Once | INTRAMUSCULAR | Status: AC
  Administered 2015-08-02: 25 mg via INTRAMUSCULAR

## 2015-08-02 MED ORDER — METHYLPREDNISOLONE SODIUM SUCC 125 MG IJ SOLR
80.0000 mg | Freq: Once | INTRAMUSCULAR | Status: AC
  Administered 2015-08-02: 80 mg via INTRAMUSCULAR

## 2015-08-02 NOTE — Discharge Instructions (Signed)
It was nice seeing you today. I am sorry about your lip swelling. It could be that you are reacting to the beef soup you ate, i Griffin recommend hospital observation for at least 24 hours to ensure that your lip swelling is going down.   Angioedema Angioedema is sudden puffiness (swelling), often of the skin. It can happen:  On your face or privates (genitals).  In your belly (abdomen) or other body parts. It usually happens quickly and gets better in 1 or 2 days. It often starts at night and is found when you wake up. You may get red, itchy patches of skin (hives). Attacks can be dangerous if your breathing passages get puffy. The condition may happen only once, or it can come back at random times. It may happen for several years before it goes away for good. HOME CARE  Only take medicines as told by your doctor.  Always carry your emergency allergy medicines with you.  Wear a medical bracelet as told by your doctor.  Avoid things that you know [name] cause attacks (triggers). GET HELP IF:  You have another attack.  Your attacks happen more often or get worse.  The condition was passed to you by your parents and you want to have children. GET HELP RIGHT AWAY IF:   Your mouth, tongue, or lips are very puffy.  You have trouble breathing.  You have trouble swallowing.  You pass out (faint). MAKE SURE YOU:   Understand these instructions.  [name] watch your condition.  [name] get help right away if you are not doing well or get worse.   This information is not intended to replace advice given to you by your health care provider. Make sure you discuss any questions you have with your health care provider.   Document Released: 04/21/2009 Document Revised: 02/21/2013 Document Reviewed: 12/25/2012 Elsevier Interactive Patient Education [employer].

## 2015-08-02 NOTE — ED Notes (Signed)
Pt here with sudden allergic reaction to possible vegetable soup today Upper lip swelling, denies tongue numbness or swelling Airway and breathing intact Took Benadryl

## 2015-08-02 NOTE — ED Notes (Signed)
Epinephrine 80ml given IM- H/R 126-132, palpitation reported, denies chest pain Pt to be transferred to ER via EMS Report update given to [facility], charge RN

## 2015-08-02 NOTE — ED Provider Notes (Addendum)
CSN: [id number]     Arrival date & time 08/02/15  1554 History   First MD Initiated Contact with Patient 08/02/15 1732     No chief complaint on file.  (Consider location/radiation/quality/duration/timing/severity/associated sxs/prior Treatment) Patient is a 65 y.o. male presenting with allergic reaction. The history is provided by the patient. No language interpreter was used.  Allergic Reaction Presenting symptoms: swelling   Presenting symptoms: no difficulty breathing, no difficulty swallowing and no itching   Presenting symptoms comment:  Lip swelling started this morning after eating vegetable and beef soup this morning. He has not eaten this in a long while. He denies hx of lip swelling in the past. Severity:  Moderate Prior allergic episodes:  No prior episodes Relieved by:  Nothing Worsened by:  Nothing tried Ineffective treatments:  None tried   No past medical history on file. No past surgical history on file. No family history on file. Social History  Substance Use Topics  . Smoking status: Never Smoker   . Smokeless tobacco: Not on file  . Alcohol Use: No    Review of Systems  HENT: Negative for facial swelling, trouble swallowing and voice change.        Lip swelling  Respiratory: Negative.   Cardiovascular: Negative.   Gastrointestinal: Negative.   Skin: Negative for itching.  All other systems reviewed and are negative.   Allergies  Review of patient's allergies indicates no known allergies.  Home Medications   Prior to Admission medications   Medication Sig Start Date End Date Taking? Authorizing Provider  b complex vitamins tablet Take 1 tablet by mouth daily.    Historical Provider, MD  Biotin 1000 MCG tablet Take 1,000 mcg by mouth daily.    Historical Provider, MD  Cinnamon 500 MG capsule Take 1,000 mg by mouth daily.    Historical Provider, MD  hydrochlorothiazide (HYDRODIURIL) 12.5 MG tablet Take 1 tablet (12.5 mg total) by mouth daily. 12/20/13    Kaitlyn Szekalski, PA-C  metFORMIN (GLUCOPHAGE) 1000 MG tablet Take 0.5 tablets (500 mg total) by mouth 2 (two) times daily. 12/20/13   Kaitlyn Szekalski, PA-C  OVER THE COUNTER MEDICATION Take 1 packet by mouth daily. Diabetes packet of vitamins    Historical Provider, MD  vitamin B-12 (CYANOCOBALAMIN) 1000 MCG tablet Take 1,000 mcg by mouth daily.    Historical Provider, MD  vitamin E 400 UNIT capsule Take 400 Units by mouth daily.    Historical Provider, MD   Meds Ordered and Administered this Visit  Medications - No data to display  BP 158/87 mmHg  Pulse 99  Temp(Src) 98.6 F (37 C) (Oral)  SpO2 100% No data found.   Physical Exam  Constitutional: He appears well-developed. No distress.  HENT:  Head:    Right Ear: External ear normal.  Left Ear: External ear normal.  Mouth/Throat: Oropharynx is clear and moist and mucous membranes are normal. No oral lesions. No uvula swelling. No oropharyngeal exudate, posterior oropharyngeal edema, posterior oropharyngeal erythema or tonsillar abscesses.    Eyes: Conjunctivae and EOM are normal. Right eye exhibits no discharge. Left eye exhibits no discharge.  Neck: Neck supple.  Cardiovascular: Normal rate, regular rhythm and normal heart sounds.   No murmur heard. Pulmonary/Chest: Effort normal and breath sounds normal. No respiratory distress. He has no wheezes.  Musculoskeletal: Normal range of motion. He exhibits no edema.  Nursing note and vitals reviewed.   ED Course  Procedures (including critical care time)  Labs Review Labs Reviewed -  No data to display  Imaging Review No results found.   Visual Acuity Review  Right Eye Distance:   Left Eye Distance:   Bilateral Distance:    Right Eye Near:   Left Eye Near:    Bilateral Near:         MDM  No diagnosis found. Angioedema, initial encounter  Patient given Epi, Benadryl and Solumedrol. I recommended 24 hrs monitoring in the hospital because of risk for  tongue swelling and posterior oropharyngeal edema which he currently does not have. Patient agreed with plan and he was transported to the ED.    Kinnie Feil, MD 08/02/15 [place], MD 08/02/15 [id number]

## 2015-08-02 NOTE — ED Notes (Signed)
Pt resting comfortable Awaiting EMS for transfer BP 157/73 123 100RA Denies chest pain with slight left posterior chest discomfort Pt is very anxious and nervous

## 2015-08-02 NOTE — ED Notes (Signed)
Pt presents to ER from urgent care via [facility] for lip swelling that began today at 0945; pt states he ate vegetable soup at 9am then took a nap and woke up to his upper lip "feeling funny", looked in mirror and saw it was swollen; pt then went to pharmacy who gave him childrens benadryl and advised he be seen by provider; he then went to urgent care who gave him 0.3mg  Epi IM, 80 solumedrol IM, and 25mg  benadryl IM; pt became tachycardic at urgent care and was transferred here

## 2015-08-02 NOTE — Discharge Instructions (Signed)
Stop lisinopril (Ace Inhibitor). This is probably causing your facial swelling. You need to list this as allergy.    Angioedema Angioedema is a sudden swelling of tissues, often of the skin. It can occur on the face or genitals or in the abdomen or other body parts. The swelling usually develops over a short period and gets better in 24 to 48 hours. It often begins during the night and is found when the person wakes up. The person may also get red, itchy patches of skin (hives). Angioedema can be dangerous if it involves swelling of the air passages.  Depending on the cause, episodes of angioedema may only happen once, come back in unpredictable patterns, or repeat for several years and then gradually fade away.  CAUSES  Angioedema can be caused by an allergic reaction to various triggers. It can also result from nonallergic causes, including reactions to drugs, immune system disorders, viral infections, or an abnormal gene that is passed to you from your parents (hereditary). For some people with angioedema, the cause is unknown.  Some things that can trigger angioedema include:   Foods.   Medicines, such as ACE inhibitors, ARBs, nonsteroidal anti-inflammatory agents, or estrogen.   Latex.   Animal saliva.   Insect stings.   Dyes used in X-rays.   Mild injury.   Dental work.  Surgery.  Stress.   Sudden changes in temperature.   Exercise. SIGNS AND SYMPTOMS   Swelling of the skin.  Hives. If these are present, there is also intense itching.  Redness in the affected area.   Pain in the affected area.  Swollen lips or tongue.  Breathing problems. This may happen if the air passages swell.  Wheezing. If internal organs are involved, there may be:   Nausea.   Abdominal pain.   Vomiting.   Difficulty swallowing.   Difficulty passing urine. DIAGNOSIS   Your health care provider [place] examine the affected area and take a medical and family  history.  Various tests may be done to help determine the cause. Tests may include:  Allergy skin tests to see if the problem is an allergic reaction.   Blood tests to check for hereditary angioedema.   Tests to check for underlying diseases that could cause the condition.   A review of your medicines, including over-the-counter medicines, may be done. TREATMENT  Treatment Anthonie depend on the cause of the angioedema. Possible treatments include:   Removal of anything that triggered the condition (such as stopping certain medicines).   Medicines to treat symptoms or prevent attacks. Medicines given may include:   Antihistamines.   Epinephrine injection.   Steroids.   Hospitalization may be required for severe attacks. If the air passages are affected, it can be an emergency. Tubes may need to be placed to keep the airway open. HOME CARE INSTRUCTIONS   Take all medicines as directed by your health care provider.  If you were given medicines for emergency allergy treatment, always carry them with you.  Wear a medical bracelet as directed by your health care provider.   Avoid known triggers. SEEK MEDICAL CARE IF:   You have repeat attacks of angioedema.   Your attacks are more frequent or more severe despite preventive measures.   You have hereditary angioedema and are considering having children. It is important to discuss with your health care provider the risks of passing the condition on to your children. SEEK IMMEDIATE MEDICAL CARE IF:   You have severe swelling of  the mouth, tongue, or lips.  You have difficulty breathing.   You have difficulty swallowing.   You faint. MAKE SURE YOU:  Understand these instructions.  Leodis watch your condition.  Simran get help right away if you are not doing well or get worse.   This information is not intended to replace advice given to you by your health care provider. Make sure you discuss any questions you have  with your health care provider.   Document Released: 07/12/2001 Document Revised: 05/24/2014 Document Reviewed: 12/25/2012 Elsevier Interactive Patient Education [employer].

## 2015-08-07 NOTE — ED Provider Notes (Signed)
CSN: [id number]     Arrival date & time 08/02/15  1909 History   First MD Initiated Contact with Patient 08/02/15 1929     Chief Complaint  Patient presents with  . Allergic Reaction  . Oral Swelling     (Consider location/radiation/quality/duration/timing/severity/associated sxs/prior Treatment) HPI   65 year old male with angioedema. Patient was seen in urgent care prior to arrival and referred to the emergency room. Prior to arrival received epinephrine, Benadryl and Solu-Medrol. Symptoms first noticed this morning. At first he felt like his upper lip was tingling. Progressively worsening. Felt tight and when he looked in the mirror noticed that it was very enlarged. No other acute complaints. No tongue swelling. No change in voice. Does not feel dizzy or lightheaded. No dyspnea. No abdominal cramping, nausea or diarrhea. He is on an ACE inhibitor.  Past Medical History  Diagnosis Date  . Hypertension   . Diabetes mellitus without complication ([place])    History reviewed. No pertinent past surgical history. History reviewed. No pertinent family history. Social History  Substance Use Topics  . Smoking status: Never Smoker   . Smokeless tobacco: None  . Alcohol Use: No    Review of Systems  All systems reviewed and negative, other than as noted in HPI.   Allergies  Lisinopril and Other  Home Medications   Prior to Admission medications   Medication Sig Start Date End Date Taking? Authorizing Provider  Alfalfa 500 MG TABS Take 1,000 mg by mouth daily.   Yes Historical Provider, MD  b complex vitamins tablet Take 1 tablet by mouth daily.   Yes Historical Provider, MD  Biotin 1000 MCG tablet Take 1,000 mcg by mouth daily.   Yes Historical Provider, MD  Cinnamon 500 MG capsule Take 1,000 mg by mouth daily.   Yes Historical Provider, MD  diltiazem (TIAZAC) 240 MG 24 hr capsule Take 240 mg by mouth daily. 07/31/15  Yes Historical Provider, MD  Ginkgo Biloba 40 MG TABS Take 40 mg  by mouth at bedtime.   Yes Historical Provider, MD  hydrochlorothiazide (HYDRODIURIL) 12.5 MG tablet Take 1 tablet (12.5 mg total) by mouth daily. Patient taking differently: Take 25 mg by mouth daily.  12/20/13  Yes Kaitlyn Szekalski, PA-C  metFORMIN (GLUCOPHAGE) 1000 MG tablet Take 0.5 tablets (500 mg total) by mouth 2 (two) times daily. Patient taking differently: Take 1,000 mg by mouth 2 (two) times daily.  12/20/13  Yes Kaitlyn Szekalski, PA-C  OVER THE COUNTER MEDICATION Take 1 tablet by mouth daily. Vegetable pill   Yes Historical Provider, MD  vitamin B-12 (CYANOCOBALAMIN) 1000 MCG tablet Take 1,000 mcg by mouth daily.   Yes Historical Provider, MD  vitamin E 400 UNIT capsule Take 1,000 Units by mouth daily.    Yes Historical Provider, MD   BP 149/85 mmHg  Pulse 88  Temp(Src) 98.9 F (37.2 C) (Oral)  Resp 16  Ht 5\' 9"  (1.753 m)  Wt 200 lb (90.719 kg)  BMI 29.52 kg/m2  SpO2 96% Physical Exam  Constitutional: He appears well-developed and well-nourished. No distress.  HENT:  Head: Normocephalic and atraumatic.  Symmetric swelling of upper lip consistent with angioedema. Oropharynx is clear. Normal sounding voice. Handling secretions. Neck supple. No stridor.  Eyes: Conjunctivae are normal. Right eye exhibits no discharge. Left eye exhibits no discharge.  Neck: Neck supple.  Cardiovascular: Normal rate, regular rhythm and normal heart sounds.  Exam reveals no gallop and no friction rub.   No murmur heard. Pulmonary/Chest: Effort normal  and breath sounds normal. No respiratory distress. He has no wheezes.  Abdominal: Soft. He exhibits no distension. There is no tenderness.  Musculoskeletal: He exhibits no edema or tenderness.  Neurological: He is alert.  Skin: Skin is warm and dry.  Psychiatric: He has a normal mood and affect. His behavior is normal. Thought content normal.  Nursing note and vitals reviewed.   ED Course  Procedures (including critical care time) Labs  Review Labs Reviewed - No data to display  Imaging Review No results found. I have personally reviewed and evaluated these images and lab results as part of my medical decision-making.   EKG Interpretation   Date/Time:  Saturday August 02 2015 19:20:54 EDT Ventricular Rate:  113 PR Interval:  164 QRS Duration: 85 QT Interval:  309 QTC Calculation: 424 R Axis:   -12 Text Interpretation:  Sinus tachycardia Ventricular premature complex  Borderline repolarization abnormality Confirmed by Wilson Singer  MD, [place]  ([id number]) on 08/02/2015 8:39:35 PM      MDM   Final diagnoses:  Angioedema, initial encounter    65 year old male with angioedema. He received Benadryl, Solu-Medrol and epinephrine prior to arrival. He was observed emergency room for a couple more hours. Symptoms improving although not resolved. No further complaints. Advised that he needs to stop taking lisinopril and adjust this as an allergy. Expect continual improvement of symptoms. Return precautions were discussed.    Virgel Manifold, MD 08/07/15 [phone]

## 2016-12-29 ENCOUNTER — Emergency Department (HOSPITAL_COMMUNITY)
Admission: EM | Admit: 2016-12-29 | Discharge: 2016-12-29 | Disposition: A | Discharge status: Home / Self Care | Payer: No Typology Code available for payment source | Attending: Emergency Medicine | Admitting: Emergency Medicine

## 2016-12-29 ENCOUNTER — Encounter (HOSPITAL_COMMUNITY): Payer: Self-pay

## 2016-12-29 ENCOUNTER — Emergency Department (HOSPITAL_COMMUNITY): Payer: No Typology Code available for payment source

## 2016-12-29 DIAGNOSIS — S39012A Strain of muscle, fascia and tendon of lower back, initial encounter: Secondary | ICD-10-CM | POA: Insufficient documentation

## 2016-12-29 DIAGNOSIS — Y998 Other external cause status: Secondary | ICD-10-CM | POA: Diagnosis not present

## 2016-12-29 DIAGNOSIS — E119 Type 2 diabetes mellitus without complications: Secondary | ICD-10-CM | POA: Diagnosis not present

## 2016-12-29 DIAGNOSIS — Y92410 Unspecified street and highway as the place of occurrence of the external cause: Secondary | ICD-10-CM | POA: Diagnosis not present

## 2016-12-29 DIAGNOSIS — S199XXA Unspecified injury of neck, initial encounter: Secondary | ICD-10-CM | POA: Diagnosis present

## 2016-12-29 DIAGNOSIS — Y939 Activity, unspecified: Secondary | ICD-10-CM | POA: Diagnosis not present

## 2016-12-29 DIAGNOSIS — Z7984 Long term (current) use of oral hypoglycemic drugs: Secondary | ICD-10-CM | POA: Diagnosis not present

## 2016-12-29 DIAGNOSIS — I10 Essential (primary) hypertension: Secondary | ICD-10-CM | POA: Insufficient documentation

## 2016-12-29 DIAGNOSIS — S161XXA Strain of muscle, fascia and tendon at neck level, initial encounter: Secondary | ICD-10-CM | POA: Insufficient documentation

## 2016-12-29 LAB — CBG MONITORING, ED: Glucose-Capillary: 369 mg/dL — ABNORMAL HIGH (ref 65–99)

## 2016-12-29 MED ORDER — CYCLOBENZAPRINE HCL 10 MG PO TABS: 10.0000 mg | ORAL_TABLET | Freq: Two times a day (BID) | ORAL | 0 refills | Status: DC | PRN

## 2016-12-29 MED ORDER — IBUPROFEN 800 MG PO TABS
800.0000 mg | ORAL_TABLET | Freq: Once | ORAL | Status: AC
  Administered 2016-12-29: 800 mg via ORAL
  Filled 2016-12-29: qty 1

## 2016-12-29 MED ORDER — ACETAMINOPHEN 500 MG PO TABS: 1000.0000 mg | ORAL_TABLET | Freq: Four times a day (QID) | ORAL | 0 refills | Status: DC | PRN
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
## 2016-12-29 NOTE — ED Notes (Signed)
Patient transported to X-ray 
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
## 2016-12-30 NOTE — ED Notes (Signed)
Dr. Johnney Killian aware of pt BP and CBG. Pt instructed to follow up with primary care.

## 2018-03-20 ENCOUNTER — Other Ambulatory Visit (HOSPITAL_COMMUNITY): Payer: Self-pay | Admitting: Family Medicine

## 2018-03-20 DIAGNOSIS — R9431 Abnormal electrocardiogram [ECG] [EKG]: Secondary | ICD-10-CM

## 2018-03-21 ENCOUNTER — Telehealth: Payer: Self-pay | Admitting: Cardiovascular Disease

## 2018-03-21 NOTE — Telephone Encounter (Signed)
  LVM with patient to call regarding a new patient appt with Dr Gwenlyn Found

## 2018-03-27 ENCOUNTER — Encounter: Payer: Self-pay | Admitting: Student

## 2018-03-27 ENCOUNTER — Encounter (HOSPITAL_COMMUNITY)
Admission: RE | Admit: 2018-03-27 | Discharge: 2018-03-27 | Disposition: A | Discharge status: Home / Self Care | Payer: Medicare Other | Source: Ambulatory Visit | Attending: Family Medicine | Admitting: Family Medicine

## 2018-03-27 DIAGNOSIS — R9431 Abnormal electrocardiogram [ECG] [EKG]: Secondary | ICD-10-CM | POA: Diagnosis present

## 2018-03-27 DIAGNOSIS — R079 Chest pain, unspecified: Secondary | ICD-10-CM

## 2018-03-27 LAB — NM MYOCAR MULTI W/SPECT W/WALL MOTION / EF
CSEPHR: 73 %
MPHR: 153 {beats}/min
Peak HR: 112 {beats}/min
Rest HR: 100 {beats}/min

## 2018-03-27 MED ORDER — TECHNETIUM TC 99M TETROFOSMIN IV KIT
10.2000 | PACK | Freq: Once | INTRAVENOUS | Status: AC | PRN
  Administered 2018-03-27: 10.2 via INTRAVENOUS

## 2018-03-27 MED ORDER — REGADENOSON 0.4 MG/5ML IV SOLN
INTRAVENOUS | Status: AC
  Filled 2018-03-27: qty 5

## 2018-03-27 MED ORDER — TECHNETIUM TC 99M TETROFOSMIN IV KIT
30.0000 | PACK | Freq: Once | INTRAVENOUS | Status: AC | PRN
  Administered 2018-03-27: 30 via INTRAVENOUS

## 2018-03-27 MED ORDER — REGADENOSON 0.4 MG/5ML IV SOLN
0.4000 mg | Freq: Once | INTRAVENOUS | Status: AC
  Administered 2018-03-27: 0.4 mg via INTRAVENOUS

## 2018-03-27 NOTE — Progress Notes (Addendum)
   [facility] was asked to proctor nuc. Per chart review, recent Echo demonstrated LVEF of 25-30%. Dr. Claudie Leach recommended nuclear stress test. His note also indicates consideration of cardiac catheterization. Outpatient appointment appears to be pending with our office. Patient completely asymptomatic from cardiac standpoint. EKG noted to show frequent PVCs which was noted in recent office visit as well. Procedure was completed without any complications. Dr. Claudie Leach is responsible for the management of the results. [facility] radiology is assigned to read this test.   Darreld Mclean, PA-C 03/27/2018 12:37 PM

## 2018-12-22 ENCOUNTER — Telehealth: Payer: Self-pay

## 2018-12-22 NOTE — Telephone Encounter (Signed)
Left voicemail for patient to call our office back to schedule an appointment in reference to a  Referral, sent to [place] by [facility]

## 2019-04-12 ENCOUNTER — Inpatient Hospital Stay (HOSPITAL_COMMUNITY): Payer: Medicare Other

## 2019-04-12 ENCOUNTER — Encounter (HOSPITAL_COMMUNITY): Payer: Self-pay | Admitting: Family Medicine

## 2019-04-12 ENCOUNTER — Other Ambulatory Visit: Payer: Self-pay

## 2019-04-12 ENCOUNTER — Inpatient Hospital Stay (HOSPITAL_COMMUNITY)
Admission: EM | Admit: 2019-04-12 | Discharge: 2019-04-18 | DRG: 286 | Disposition: A | Discharge status: Home / Self Care | Payer: Medicare Other | Attending: Family Medicine | Admitting: Family Medicine

## 2019-04-12 ENCOUNTER — Emergency Department (HOSPITAL_COMMUNITY): Payer: Medicare Other

## 2019-04-12 DIAGNOSIS — E871 Hypo-osmolality and hyponatremia: Secondary | ICD-10-CM | POA: Diagnosis present

## 2019-04-12 DIAGNOSIS — R0902 Hypoxemia: Secondary | ICD-10-CM | POA: Diagnosis present

## 2019-04-12 DIAGNOSIS — I130 Hypertensive heart and chronic kidney disease with heart failure and stage 1 through stage 4 chronic kidney disease, or unspecified chronic kidney disease: Secondary | ICD-10-CM | POA: Diagnosis present

## 2019-04-12 DIAGNOSIS — E1165 Type 2 diabetes mellitus with hyperglycemia: Secondary | ICD-10-CM | POA: Diagnosis present

## 2019-04-12 DIAGNOSIS — N1831 Chronic kidney disease, stage 3a: Secondary | ICD-10-CM | POA: Diagnosis not present

## 2019-04-12 DIAGNOSIS — I5020 Unspecified systolic (congestive) heart failure: Secondary | ICD-10-CM | POA: Diagnosis not present

## 2019-04-12 DIAGNOSIS — I2729 Other secondary pulmonary hypertension: Secondary | ICD-10-CM | POA: Diagnosis present

## 2019-04-12 DIAGNOSIS — IMO0002 Reserved for concepts with insufficient information to code with codable children: Secondary | ICD-10-CM

## 2019-04-12 DIAGNOSIS — Z91120 Patient's intentional underdosing of medication regimen due to financial hardship: Secondary | ICD-10-CM

## 2019-04-12 DIAGNOSIS — I10 Essential (primary) hypertension: Secondary | ICD-10-CM

## 2019-04-12 DIAGNOSIS — N289 Disorder of kidney and ureter, unspecified: Secondary | ICD-10-CM | POA: Diagnosis not present

## 2019-04-12 DIAGNOSIS — T465X6A Underdosing of other antihypertensive drugs, initial encounter: Secondary | ICD-10-CM | POA: Diagnosis present

## 2019-04-12 DIAGNOSIS — Z20828 Contact with and (suspected) exposure to other viral communicable diseases: Secondary | ICD-10-CM | POA: Diagnosis present

## 2019-04-12 DIAGNOSIS — I5043 Acute on chronic combined systolic (congestive) and diastolic (congestive) heart failure: Secondary | ICD-10-CM | POA: Diagnosis not present

## 2019-04-12 DIAGNOSIS — R609 Edema, unspecified: Secondary | ICD-10-CM | POA: Diagnosis not present

## 2019-04-12 DIAGNOSIS — E1122 Type 2 diabetes mellitus with diabetic chronic kidney disease: Secondary | ICD-10-CM | POA: Diagnosis present

## 2019-04-12 DIAGNOSIS — I509 Heart failure, unspecified: Secondary | ICD-10-CM

## 2019-04-12 DIAGNOSIS — I493 Ventricular premature depolarization: Secondary | ICD-10-CM | POA: Diagnosis not present

## 2019-04-12 DIAGNOSIS — N179 Acute kidney failure, unspecified: Secondary | ICD-10-CM | POA: Diagnosis present

## 2019-04-12 DIAGNOSIS — N183 Chronic kidney disease, stage 3 unspecified: Secondary | ICD-10-CM | POA: Diagnosis present

## 2019-04-12 DIAGNOSIS — Z8249 Family history of ischemic heart disease and other diseases of the circulatory system: Secondary | ICD-10-CM | POA: Diagnosis not present

## 2019-04-12 DIAGNOSIS — R600 Localized edema: Secondary | ICD-10-CM

## 2019-04-12 DIAGNOSIS — I5023 Acute on chronic systolic (congestive) heart failure: Secondary | ICD-10-CM | POA: Diagnosis present

## 2019-04-12 DIAGNOSIS — I428 Other cardiomyopathies: Secondary | ICD-10-CM | POA: Diagnosis present

## 2019-04-12 DIAGNOSIS — I340 Nonrheumatic mitral (valve) insufficiency: Secondary | ICD-10-CM | POA: Diagnosis not present

## 2019-04-12 DIAGNOSIS — I472 Ventricular tachycardia: Secondary | ICD-10-CM | POA: Diagnosis not present

## 2019-04-12 DIAGNOSIS — Z7984 Long term (current) use of oral hypoglycemic drugs: Secondary | ICD-10-CM | POA: Diagnosis not present

## 2019-04-12 DIAGNOSIS — I5021 Acute systolic (congestive) heart failure: Secondary | ICD-10-CM | POA: Diagnosis not present

## 2019-04-12 DIAGNOSIS — I361 Nonrheumatic tricuspid (valve) insufficiency: Secondary | ICD-10-CM | POA: Diagnosis not present

## 2019-04-12 DIAGNOSIS — I4729 Other ventricular tachycardia: Secondary | ICD-10-CM

## 2019-04-12 HISTORY — DX: Unspecified systolic (congestive) heart failure: I50.20

## 2019-04-12 LAB — CBC
HCT: 35.1 % — ABNORMAL LOW (ref 39.0–52.0)
Hemoglobin: 12 g/dL — ABNORMAL LOW (ref 13.0–17.0)
MCH: 29.3 pg (ref 26.0–34.0)
MCHC: 34.2 g/dL (ref 30.0–36.0)
MCV: 85.6 fL (ref 80.0–100.0)
Platelets: 261 10*3/uL (ref 150–400)
RBC: 4.1 MIL/uL — ABNORMAL LOW (ref 4.22–5.81)
RDW: 13.9 % (ref 11.5–15.5)
WBC: 4.6 10*3/uL (ref 4.0–10.5)
nRBC: 0 % (ref 0.0–0.2)

## 2019-04-12 LAB — HEPATIC FUNCTION PANEL
ALT: 42 U/L (ref 0–44)
AST: 48 U/L — ABNORMAL HIGH (ref 15–41)
Albumin: 3.3 g/dL — ABNORMAL LOW (ref 3.5–5.0)
Alkaline Phosphatase: 102 U/L (ref 38–126)
Bilirubin, Direct: 0.2 mg/dL (ref 0.0–0.2)
Indirect Bilirubin: 0.8 mg/dL (ref 0.3–0.9)
Total Bilirubin: 1 mg/dL (ref 0.3–1.2)
Total Protein: 7.1 g/dL (ref 6.5–8.1)

## 2019-04-12 LAB — PROTEIN / CREATININE RATIO, URINE
Creatinine, Urine: 151.53 mg/dL
Protein Creatinine Ratio: 1.67 mg/mg{Cre} — ABNORMAL HIGH (ref 0.00–0.15)
Total Protein, Urine: 253 mg/dL

## 2019-04-12 LAB — BASIC METABOLIC PANEL
Anion gap: 11 (ref 5–15)
BUN: 17 mg/dL (ref 8–23)
CO2: 20 mmol/L — ABNORMAL LOW (ref 22–32)
Calcium: 7.7 mg/dL — ABNORMAL LOW (ref 8.9–10.3)
Chloride: 89 mmol/L — ABNORMAL LOW (ref 98–111)
Creatinine, Ser: 1.67 mg/dL — ABNORMAL HIGH (ref 0.61–1.24)
GFR calc Af Amer: 48 mL/min — ABNORMAL LOW (ref >60)
GFR calc non Af Amer: 41 mL/min — ABNORMAL LOW (ref >60)
Glucose, Bld: 195 mg/dL — ABNORMAL HIGH (ref 70–99)
Potassium: 4.8 mmol/L (ref 3.5–5.1)
Sodium: 120 mmol/L — ABNORMAL LOW (ref 135–145)

## 2019-04-12 LAB — URINALYSIS, ROUTINE W REFLEX MICROSCOPIC
Bacteria, UA: NONE SEEN
Bilirubin Urine: NEGATIVE
Glucose, UA: NEGATIVE mg/dL
Hgb urine dipstick: NEGATIVE
Ketones, ur: 5 mg/dL — AB
Leukocytes,Ua: NEGATIVE
Nitrite: NEGATIVE
Protein, ur: 100 mg/dL — AB
Specific Gravity, Urine: 1.016 (ref 1.005–1.030)
pH: 5 (ref 5.0–8.0)

## 2019-04-12 LAB — BRAIN NATRIURETIC PEPTIDE: B Natriuretic Peptide: 2832 pg/mL — ABNORMAL HIGH (ref 0.0–100.0)

## 2019-04-12 LAB — SODIUM, URINE, RANDOM: Sodium, Ur: 30 mmol/L

## 2019-04-12 LAB — SARS CORONAVIRUS 2 (TAT 6-24 HRS): SARS Coronavirus 2: NEGATIVE

## 2019-04-12 LAB — OSMOLALITY, URINE: Osmolality, Ur: 528 mOsm/kg (ref 300–900)

## 2019-04-12 LAB — MAGNESIUM: Magnesium: 1.6 mg/dL — ABNORMAL LOW (ref 1.7–2.4)

## 2019-04-12 LAB — CBG MONITORING, ED: Glucose-Capillary: 167 mg/dL — ABNORMAL HIGH (ref 70–99)

## 2019-04-12 MED ORDER — ACETAMINOPHEN 325 MG PO TABS
650.0000 mg | ORAL_TABLET | ORAL | Status: DC | PRN
  Administered 2019-04-13 – 2019-04-15 (×2): 650 mg via ORAL
  Filled 2019-04-12 (×2): qty 2

## 2019-04-12 MED ORDER — SODIUM CHLORIDE 0.9 % IV SOLN: 250.0000 mL | INTRAVENOUS | Status: DC | PRN

## 2019-04-12 MED ORDER — FUROSEMIDE 10 MG/ML IJ SOLN
40.0000 mg | Freq: Once | INTRAMUSCULAR | Status: AC
  Administered 2019-04-12: 40 mg via INTRAVENOUS
  Filled 2019-04-12: qty 4

## 2019-04-12 MED ORDER — ENOXAPARIN SODIUM 40 MG/0.4ML ~~LOC~~ SOLN
40.0000 mg | SUBCUTANEOUS | Status: DC
  Administered 2019-04-12: 40 mg via SUBCUTANEOUS
  Filled 2019-04-12: qty 0.4

## 2019-04-12 MED ORDER — SODIUM CHLORIDE 0.9% FLUSH
3.0000 mL | Freq: Two times a day (BID) | INTRAVENOUS | Status: DC
  Administered 2019-04-13: 3 mL via INTRAVENOUS

## 2019-04-12 MED ORDER — IOHEXOL 300 MG/ML  SOLN
75.0000 mL | Freq: Once | INTRAMUSCULAR | Status: AC | PRN
  Administered 2019-04-12: 75 mL via INTRAVENOUS

## 2019-04-12 MED ORDER — SODIUM CHLORIDE 0.9% FLUSH: 3.0000 mL | INTRAVENOUS | Status: DC | PRN

## 2019-04-12 MED ORDER — SODIUM CHLORIDE 0.9% FLUSH: 3.0000 mL | Freq: Once | INTRAVENOUS | Status: DC

## 2019-04-12 MED ORDER — MAGNESIUM OXIDE 400 (241.3 MG) MG PO TABS
400.0000 mg | ORAL_TABLET | Freq: Once | ORAL | Status: AC
  Administered 2019-04-12: 400 mg via ORAL
  Filled 2019-04-12: qty 1

## 2019-04-12 MED ORDER — CALCIUM CARBONATE ANTACID 500 MG PO CHEW
800.0000 mg | CHEWABLE_TABLET | Freq: Two times a day (BID) | ORAL | Status: DC
  Administered 2019-04-13 – 2019-04-18 (×11): 800 mg via ORAL
  Filled 2019-04-12 (×11): qty 4

## 2019-04-12 MED ORDER — INSULIN ASPART 100 UNIT/ML ~~LOC~~ SOLN
0.0000 [IU] | Freq: Three times a day (TID) | SUBCUTANEOUS | Status: DC
  Administered 2019-04-13 (×2): 1 [IU] via SUBCUTANEOUS
  Administered 2019-04-13: 2 [IU] via SUBCUTANEOUS
  Administered 2019-04-14: 1 [IU] via SUBCUTANEOUS
  Administered 2019-04-14: 2 [IU] via SUBCUTANEOUS
  Administered 2019-04-15: 3 [IU] via SUBCUTANEOUS
  Administered 2019-04-16 (×2): 1 [IU] via SUBCUTANEOUS
  Administered 2019-04-17 – 2019-04-18 (×2): 3 [IU] via SUBCUTANEOUS

## 2019-04-12 MED ORDER — HYDROCHLOROTHIAZIDE 25 MG PO TABS: 25.0000 mg | ORAL_TABLET | Freq: Once | ORAL | Status: DC

## 2019-04-12 NOTE — ED Triage Notes (Signed)
Pt here for evaluation of bilateral leg and groin swelling for several months. Pt sts he ran out of his bp meds a few months ago. Pt denies pain, endorses intermittent shob with exertion today.

## 2019-04-12 NOTE — ED Notes (Signed)
Pt is NSR on monitor 

## 2019-04-12 NOTE — ED Provider Notes (Signed)
[facility] EMERGENCY DEPARTMENT Provider Note   CSN: [id number] Arrival date & time: 04/12/19  1534     History   Chief Complaint Chief Complaint  Patient presents with   Leg Swelling    HPI [name] is a 68 y.o. male.     HPI  Pt is a 68 year old male with PMH of DM and HTN who presents to the ED with concern for leg swelling and shortness of breath. Patient reports over the last year to few months he has had worsening swelling of his bilateral lower extremities.  Patient reports the right leg seems to be more swollen than the left and this is his baseline.  Patient states over the last 3 days the swelling has extended up to his groin and he has noticed penile swelling.  No penile discharge.  No penile pain.  Patient denies any dysuria or hematuria.  Patient also states over the last 2 days he has had worsening shortness of breath and this is worse with exertion.  He denies any known history of heart failure.  Patient denies any chest pain.  +orthopnea. +cough. No fever/chills/recent illness.   Past Medical History:  Diagnosis Date   Diabetes mellitus without complication ([place])    Heart failure with reduced ejection fraction [facility])    Hypertension     Patient Active Problem List   Diagnosis Date Noted   CHF (congestive heart failure) ([place]) 04/13/2019   CHF exacerbation ([place]) 04/12/2019   HFrEF (heart failure with reduced ejection fraction) ([place]) 04/12/2019    History reviewed. No pertinent surgical history.      Home Medications    Prior to Admission medications   Medication Sig Start Date End Date Taking? Authorizing Provider  acetaminophen (TYLENOL) 500 MG tablet Take 2 tablets (1,000 mg total) by mouth every 6 (six) hours as needed. Patient taking differently: Take 1,000 mg by mouth every 6 (six) hours as needed for headache (pain).  12/29/16  Yes Pfeiffer, Jeannie Done, MD  Alfalfa 500 MG TABS Take 1,000 mg by mouth daily.   Yes [provider]  Ascorbic Acid (VITAMIN C PO) Take 1 tablet by mouth daily.   Yes [provider]  ASHWAGANDHA PO Take 1 tablet by mouth daily.   Yes [provider]  b complex vitamins tablet Take 1 tablet by mouth daily.   Yes [provider]  Biotin 1000 MCG tablet Take 1,000 mcg by mouth daily.   Yes [provider]  Cinnamon 500 MG capsule Take 1,000 mg by mouth daily.   Yes [provider]  Docosahexaenoic Acid (DHA PO) Take 1 capsule by mouth daily.   Yes [provider]  Ginkgo Biloba 40 MG TABS Take 40 mg by mouth at bedtime.   Yes [provider]  hydrochlorothiazide (HYDRODIURIL) 25 MG tablet Take 25 mg by mouth every morning. 01/08/19  Yes [provider]  metFORMIN (GLUCOPHAGE) 1000 MG tablet Take 0.5 tablets (500 mg total) by mouth 2 (two) times daily. Patient taking differently: Take 1,000 mg by mouth 2 (two) times daily.  12/20/13  Yes Szekalski, Kaitlyn, PA-C  Tetrahydrozoline HCl (VISINE OP) Place 1 drop into both eyes daily as needed (dry eyes).   Yes [provider]  TURMERIC PO Take 1 capsule by mouth daily.   Yes [provider]  VITAMIN A PO Take 1 capsule by mouth daily.   Yes [provider]  VITAMIN D PO Take 1 capsule by  mouth daily.   Yes [provider]  vitamin E 1000 UNIT capsule Take 1,000 Units by mouth daily.    Yes [provider]  Zinc 50 MG TABS Take 50 mg by mouth daily.   Yes [provider]  cyclobenzaprine (FLEXERIL) 10 MG tablet Take 1 tablet (10 mg total) by mouth 2 (two) times daily as needed for muscle spasms. Patient not taking: Reported on 03/27/2018 12/29/16   Charlesetta Shanks, MD  hydrochlorothiazide (HYDRODIURIL) 12.5 MG tablet Take 1 tablet (12.5 mg total) by mouth daily. Patient not taking: Reported on 04/12/2019 12/20/13   Alvina Chou, PA-C    Family History Family History  Problem Relation Age of Onset   Heart  failure Mother    Heart failure Brother    Heart attack Brother     Social History Social History   Tobacco Use   Smoking status: Never Smoker   Smokeless tobacco: Never Used  Substance Use Topics   Alcohol use: No   Drug use: No     Allergies   Lisinopril and Other   Review of Systems Review of Systems  Constitutional: Negative for chills and fever.  HENT: Negative for congestion.   Respiratory: Positive for cough and shortness of breath.   Cardiovascular: Positive for leg swelling.  Gastrointestinal: Negative for abdominal pain, nausea and vomiting.  Genitourinary: Positive for penile swelling. Negative for discharge, dysuria, penile pain and testicular pain.  Neurological: Negative for headaches.  Psychiatric/Behavioral: Negative for agitation and behavioral problems.     Physical Exam Updated Vital Signs BP (!) 151/124 (BP Location: Left Arm) Comment: Notified RN   Pulse 97    Temp 98.4 F (36.9 C) (Oral)    Resp 20    Ht 5\' 9"  (1.753 m)    Wt 103.6 kg    SpO2 97%    BMI 33.74 kg/m   Physical Exam Vitals signs and nursing note reviewed.  HENT:     Head: Normocephalic and atraumatic.  Eyes:     Extraocular Movements: Extraocular movements intact.     Pupils: Pupils are equal, round, and reactive to light.  Cardiovascular:     Rate and Rhythm: Normal rate and regular rhythm.  Pulmonary:     Effort: Pulmonary effort is normal. No respiratory distress.     Breath sounds: Normal breath sounds.  Abdominal:     General: There is distension.     Tenderness: There is no abdominal tenderness.  Genitourinary:    Comments: Edema of penis Able to retract foreskin without pain or evidence of phimosis Musculoskeletal:        General: Swelling present.     Right lower leg: Edema present.     Left lower leg: Edema present.     Comments: 2+ Pitting edema of BLE to the level of waist  Neurological:     General: No focal deficit present.     Mental Status: He is  alert and oriented to person, place, and time.  Psychiatric:        Mood and Affect: Mood normal.        Behavior: Behavior normal.      ED Treatments / Results  Labs (all labs ordered are listed, but only abnormal results are displayed) Labs Reviewed  BASIC METABOLIC PANEL - Abnormal; Notable for the following components:      Result Value   Sodium 120 (*)    Chloride 89 (*)    CO2 20 (*)    Glucose,  Bld 195 (*)    Creatinine, Ser 1.67 (*)    Calcium 7.7 (*)    GFR calc non Af Amer 41 (*)    GFR calc Af Amer 48 (*)    All other components within normal limits  CBC - Abnormal; Notable for the following components:   RBC 4.10 (*)    Hemoglobin 12.0 (*)    HCT 35.1 (*)    All other components within normal limits  BRAIN NATRIURETIC PEPTIDE - Abnormal; Notable for the following components:   B Natriuretic Peptide 2,832.0 (*)    All other components within normal limits  URINALYSIS, ROUTINE W REFLEX MICROSCOPIC - Abnormal; Notable for the following components:   Ketones, ur 5 (*)    Protein, ur 100 (*)    All other components within normal limits  PROTEIN / CREATININE RATIO, URINE - Abnormal; Notable for the following components:   Protein Creatinine Ratio 1.67 (*)    All other components within normal limits  MAGNESIUM - Abnormal; Notable for the following components:   Magnesium 1.6 (*)    All other components within normal limits  HEPATIC FUNCTION PANEL - Abnormal; Notable for the following components:   Albumin 3.3 (*)    AST 48 (*)    All other components within normal limits  HEMOGLOBIN A1C - Abnormal; Notable for the following components:   Hgb A1c MFr Bld 7.4 (*)    All other components within normal limits  BASIC METABOLIC PANEL - Abnormal; Notable for the following components:   Sodium 122 (*)    Chloride 93 (*)    CO2 19 (*)    Glucose, Bld 193 (*)    Creatinine, Ser 1.55 (*)    Calcium 7.6 (*)    GFR calc non Af Amer 45 (*)    GFR calc Af Amer 52 (*)      All other components within normal limits  BASIC METABOLIC PANEL - Abnormal; Notable for the following components:   Sodium 122 (*)    Chloride 90 (*)    CO2 21 (*)    Glucose, Bld 208 (*)    Creatinine, Ser 1.58 (*)    Calcium 7.7 (*)    GFR calc non Af Amer 44 (*)    GFR calc Af Amer 51 (*)    All other components within normal limits  GLUCOSE, CAPILLARY - Abnormal; Notable for the following components:   Glucose-Capillary 169 (*)    All other components within normal limits  ALBUMIN - Abnormal; Notable for the following components:   Albumin 3.3 (*)    All other components within normal limits  GLUCOSE, CAPILLARY - Abnormal; Notable for the following components:   Glucose-Capillary 208 (*)    All other components within normal limits  CBG MONITORING, ED - Abnormal; Notable for the following components:   Glucose-Capillary 167 (*)    All other components within normal limits  SARS CORONAVIRUS 2 (TAT 6-24 HRS)  SODIUM, URINE, RANDOM  OSMOLALITY, URINE  HIV ANTIBODY (ROUTINE TESTING W REFLEX)  MAGNESIUM  HEPATITIS PANEL, ACUTE  BASIC METABOLIC PANEL  LIPID PANEL  TSH    EKG EKG Interpretation  Date/Time:  Thursday April 12 2019 15:47:50 EST Ventricular Rate:  106 PR Interval:  166 QRS Duration: 84 QT Interval:  370 QTC Calculation: 491 R Axis:   31 Text Interpretation: Sinus tachycardia with occasional Premature ventricular complexes and Fusion complexes ST & T wave abnormality, consider lateral ischemia Abnormal ECG Confirmed by Ralene Bathe,  Benjamine Mola [phone]) on 04/12/2019 4:11:52 PM   Radiology Dg Chest 2 View  Result Date: 04/12/2019 CLINICAL DATA:  Leg swelling, shortness of breath. EXAM: CHEST - 2 VIEW COMPARISON:  Chest radiograph 06/02/2008 FINDINGS: Heart size within normal limits.  Aortic atherosclerosis. A triangular opacity projects in the region of the medial right lung on PA radiograph. Lungs otherwise clear. No evidence of pneumothorax or pleural  effusion. No acute bony abnormality. IMPRESSION: Triangular opacity projecting in the region of the medial right lung on PA radiograph, suspicious for right middle lobe collapse and/or pneumonia. Chest CT is recommended for further evaluation and to exclude a central obstructing mass. Electronically Signed   By: Kellie Simmering DO   On: 04/12/2019 16:42   Ct Chest W Contrast  Result Date: 04/12/2019 CLINICAL DATA:  Chest x-ray concerning for mass. EXAM: CT CHEST WITH CONTRAST TECHNIQUE: Multidetector CT imaging of the chest was performed during intravenous contrast administration. CONTRAST:  22mL OMNIPAQUE IOHEXOL 300 MG/ML  SOLN COMPARISON:  Chest radiograph 04/12/2019 FINDINGS: Cardiovascular: Mild cardiomegaly. Calcified coronary artery disease. No thoracic aortic aneurysm. There is reflux of contrast into the inferior vena cava and hepatic veins. Mediastinum/Nodes: -- No enlarged mediastinal, hilar or axillary lymph nodes. -- The visualized thyroid gland is unremarkable. Lungs/Pleura: Mild ground-glass opacity within the dependent lower lobes bilaterally which likely reflects edema. Small bilateral pleural effusions. No airspace consolidation. No pneumothorax. The central airways are grossly patent. Upper Abdomen: Ascites within partially imaged upper abdomen. Musculoskeletal: No acute bony abnormality. IMPRESSION: No intrathoracic mass. No findings to account for the chest x-ray appearance earlier today. Mild cardiomegaly with coronary artery disease. Reflux of contrast into the IVC and hepatic veins, a finding which may be seen in the setting of heart failure. Mild bilateral lower lobe pulmonary edema with small bilateral pleural effusions. Ascites within the partially imaged upper abdomen. Electronically Signed   By: Kellie Simmering DO   On: 04/12/2019 21:39    Procedures Procedures (including critical care time)  Medications Ordered in ED Medications  acetaminophen (TYLENOL) tablet 650 mg (650 mg Oral  Given 04/13/19 0430)  insulin aspart (novoLOG) injection 0-6 Units (2 Units Subcutaneous Given 04/13/19 1221)  calcium carbonate (TUMS - dosed in mg elemental calcium) chewable tablet 800 mg of elemental calcium (800 mg of elemental calcium Oral Given 04/13/19 0916)  enoxaparin (LOVENOX) injection 40 mg (has no administration in time range)  guaiFENesin-dextromethorphan (ROBITUSSIN DM) 100-10 MG/5ML syrup 5 mL (5 mLs Oral Given 04/13/19 0442)  sodium chloride flush (NS) 0.9 % injection 3 mL (3 mLs Intravenous Given 04/13/19 1222)  sodium chloride flush (NS) 0.9 % injection 3 mL (has no administration in time range)  0.9 %  sodium chloride infusion (has no administration in time range)  aspirin chewable tablet 81 mg (has no administration in time range)  metoprolol tartrate (LOPRESSOR) tablet 25 mg (25 mg Oral Given 04/13/19 1221)  furosemide (LASIX) 8 MG/ML solution 40 mg (has no administration in time range)  magnesium oxide (MAG-OX) tablet 400 mg (400 mg Oral Given 04/12/19 1812)  furosemide (LASIX) injection 40 mg (40 mg Intravenous Given 04/12/19 2046)  iohexol (OMNIPAQUE) 300 MG/ML solution 75 mL (75 mLs Intravenous Contrast Given 04/12/19 2116)  furosemide (LASIX) injection 40 mg (40 mg Intravenous Given 04/13/19 0746)     Initial Impression / Assessment and Plan / ED Course  I have reviewed the triage vital signs and the nursing notes.  Pertinent labs & imaging results that were available during my care  of the patient were reviewed by me and considered in my medical decision making (see chart for details).       On arrival, pt is afebrile, mildly tachycardic, hypertensive.  Exam consistent with anasarca. Per chart review, recent myocardial perfusion study showed EF of 24% Patient denies any chest pain.   EKG: Sinus tachycardia, occasional PVCs, anterior q waves similar when compared to prior CXR: "Triangular opacity projecting in the region of the medial right lung on PA  radiograph, suspicious for right middle lobe collapse and/or pneumonia. Chest CT is recommended for further evaluation and to exclude a central obstructing mass."  Labs concerning for hypervolemic hyponatremia. Pt endorses taking an over the counter "fluid pill'. He would benefit from diuresis and medication management. Family medicine contacted for admission and have assumed care of the patient.   Final Clinical Impressions(s) / ED Diagnoses   Final diagnoses:  Peripheral edema  Bilateral lower extremity edema  Hyponatremia    ED Discharge Orders    None       Burns Spain, MD 04/13/19 1311    Quintella Reichert, MD 04/15/19 2025

## 2019-04-12 NOTE — H&P (Addendum)
[facility] Admission History and Physical Service Pager: [phone]  Patient name: [name] Medical record number: [id number] Date of birth: [birth year] Age: 68 y.o. Gender: male  Primary Care Provider: [name], PA-C Consultants: Cardiology (in the am) Code Status: Full code Preferred Emergency Contact: Enrique Sack [phone]  Chief Complaint:  leg swelling, SOB  Assessment and Plan: [name] is a 68 y.o. male presenting with leg swelling, shortness of breath. PMH is significant for HFrEF, T2DM, HTN.  Anasarca likely 2/2 Acute on chronic HFrEF 25-30% Patient presents with 2 days of worsening lower extremity and genitalia edema and shortness of breath.  Patient has a history of edema and orthopnea using with 3 pillows at home, but he has noticed it has become acutely worse recently. He denies fever, chills, chest pain, N/V/D. Patient is poor historian regarding medications, he has not been on any recently due to inability to afford medications and co-pays.  Previously patient of [facility].  Patient is tachypneic to the mid 20s and hypertensive, satting in the mid 90s on room air, otherwise vital signs stable. On exam patient can speak 2-3 words before getting short of breath. He is grossly edematous in his abdomen, genitals, and lower extremities including +1 pitting edema to hips.  Chest x-ray reveals opacity in the right mid lobe that could be collapsed middle lobe, pneumonia, or other mass.  CT chest recommended by radiology and has been ordered.  On labs patient is notably hyponatremic to 120, BNP of 2,832, UA reveals ketones of 5 and protein of 100, urine micro with granular and hyaline casts.  Patient most likely has CHF exacerbation in the setting of severely reduced ejection fraction and not on appropriate therapies at home.  Last echo was in 2019 which revealed the ejection fraction of 25 to 30%, at that time a right and left heart  cath had been proposed in the outpatient setting but was not done, no clear records of follow-up for heart failure since then. Differential for SOB also includes: PE (Well's score 1.5 for HR) making this less likely vs pna but CXR with no signs and patient afebrile. Anasarca may be due to nephrotic syndrome given protein in urine, but likely due to known heart failure.  -Admit to FPTS, telemetry, attending Dr. Owens Shark -Consult cardiology in a.m., appreciate recommendations -CT chest with contrast ordered -Echocardiogram ordered for tomorrow -Furosemide 40 mg IV given once, follow I's/O's and creatinine for further diuresis tomorrow -Strict intake and output -Daily weights -Heart healthy, carb restricted diet with fluid restriction -Up with assistance -Lovenox for DVT PPx -AM CBC, CMP -PT/OT eval and treat  ?AKI Creatinine on admission 1.67, GFR 48. Appears baseline creatinine around 1.1-1.2, however last time it was charted was in 2015. Patient reports no known kidney issues. UA unremarkable except for ketones of 5, protein of 100, microscopy with both granular and hyaline casts. FeNa 0.3% indicating pre-renal cause, most likely due to heart failure.  - 1 dose of lasix 40 mg IV given to reduce severe edema, Sotirios redose lasix based on cr response. - AM BMP - Avoid nephrotoxic agents  HTN Pt is hypertensive to SBP 150-160s, DBP 110s, but asymptomatic. Previously he was using HCTZ, but has been out of this medication as he states he cannot afford co-pays and has consequently not been able to see his PCP. We Elsie likely start beta-blocker and spironolactone in setting of CHF, Jrake monitor with improvement of Lasix, await echo and cardiology recommendations -  Continue to monitor blood pressure -Start spironolactone and beta-blocker as tolerated after echo  Diabetes Pt states that he takes 1000 mg of Metformin twice daily.  BG 195 on BMP. -A1c ordered -Home Metformin held in setting of likely  elevated creatinine -Sensitive SSI -We Alferd titrate treatment based on A1c and blood glucose checks in the hospital over the next 24 hours   Hypomagnesemia Mg 1.6 on admission. Pt received mag-ox 400 mg x1. EKG with occasional PVC's.  - Check mg tomorrow - Replete as needed  Hypocalcemia Patient with corrected calcium level of 8.3. Khalif replete magnesium as above and with tums  - am BMP - tums 800mg    Social Concerns Patient has difficulty accessing PCP and medications due to cost, however patient is 68 years old should have Medicare. -Consult social work and case management as appropriate to help patient access appropriate care and medications for long-term management  FEN/GI: heart healthy, diabetic, and fluid restriction Prophylaxis: lovenox  Disposition: admit to telemetry  History of Present Illness:  [name] is a 68 y.o. male presenting with groin and leg swelling. He states he is also somewhat short of breath when he is wearing his mask, and that previously was not the case. He denies any pain. He says he has been swollen on and off for a long time, but now it is worse than normal and he has never had swelling in his private parts. He reports that the groin swelling has been present for 2 days. He has not been peeing as much as he had been before. She gave hydrochlorothiazide for the swelling. Cannot lay flat at home, uses 3 pillows at night.   Review Of Systems: Per HPI with the following additions:   Review of Systems  Constitutional: Negative for chills and fever.  HENT: Negative for congestion.   Respiratory: Positive for cough, sputum production and shortness of breath.   Cardiovascular: Positive for orthopnea and leg swelling. Negative for chest pain.  Gastrointestinal: Positive for constipation. Negative for abdominal pain and blood in stool.  Musculoskeletal: Negative for joint pain.  Neurological: Negative for dizziness.    Patient Active Problem List    Diagnosis Date Noted  . CHF exacerbation ([place]) 04/12/2019    Past Medical History: Past Medical History:  Diagnosis Date  . Diabetes mellitus without complication ([place])   . Hypertension     Past Surgical History: No past surgical history on file.  Social History: Social History   Tobacco Use  . Smoking status: Never Smoker  . Smokeless tobacco: Never Used  Substance Use Topics  . Alcohol use: No  . Drug use: No   Additional social history: Denies smoking history, alcohol use, illicit drug use Please also refer to relevant sections of EMR.  Family History: No family history on file. Brother had heart failure Mother had heart failure  Allergies and Medications: Allergies  Allergen Reactions  . Lisinopril Swelling    Lip swelling  . Other Other (See Comments)    Seasonal allergies   No current facility-administered medications on file prior to encounter.    Current Outpatient Medications on File Prior to Encounter  Medication Sig Dispense Refill  . acetaminophen (TYLENOL) 500 MG tablet Take 2 tablets (1,000 mg total) by mouth every 6 (six) hours as needed. (Patient taking differently: Take 1,000 mg by mouth every 6 (six) hours as needed for headache (pain). ) 30 tablet 0  . Alfalfa 500 MG TABS Take 1,000 mg by mouth daily.    Marland Kitchen  Ascorbic Acid (VITAMIN C PO) Take 1 tablet by mouth daily.    . ASHWAGANDHA PO Take 1 tablet by mouth daily.    Marland Kitchen b complex vitamins tablet Take 1 tablet by mouth daily.    . Biotin 1000 MCG tablet Take 1,000 mcg by mouth daily.    . Cinnamon 500 MG capsule Take 1,000 mg by mouth daily.    . Docosahexaenoic Acid (DHA PO) Take 1 capsule by mouth daily.    . Ginkgo Biloba 40 MG TABS Take 40 mg by mouth at bedtime.    . hydrochlorothiazide (HYDRODIURIL) 25 MG tablet Take 25 mg by mouth every morning.    . metFORMIN (GLUCOPHAGE) 1000 MG tablet Take 0.5 tablets (500 mg total) by mouth 2 (two) times daily. (Patient taking differently: Take 1,000 mg  by mouth 2 (two) times daily. ) 30 tablet 0  . Tetrahydrozoline HCl (VISINE OP) Place 1 drop into both eyes daily as needed (dry eyes).    . TURMERIC PO Take 1 capsule by mouth daily.    Marland Kitchen VITAMIN A PO Take 1 capsule by mouth daily.    Marland Kitchen VITAMIN D PO Take 1 capsule by mouth daily.    . vitamin E 1000 UNIT capsule Take 1,000 Units by mouth daily.     . Zinc 50 MG TABS Take 50 mg by mouth daily.    . cyclobenzaprine (FLEXERIL) 10 MG tablet Take 1 tablet (10 mg total) by mouth 2 (two) times daily as needed for muscle spasms. (Patient not taking: Reported on 03/27/2018) 20 tablet 0  . hydrochlorothiazide (HYDRODIURIL) 12.5 MG tablet Take 1 tablet (12.5 mg total) by mouth daily. (Patient not taking: Reported on 04/12/2019) 30 tablet 1    Objective: BP (!) 166/116   Pulse 96   Temp 98.3 F (36.8 C)   Resp (!) 26   SpO2 100%  Physical Exam Constitutional:      General: He is not in acute distress.    Appearance: He is not ill-appearing.     Comments: Very fluid overloaded  HENT:     Head: Normocephalic and atraumatic.  Eyes:     Conjunctiva/sclera: Conjunctivae normal.  Cardiovascular:     Rate and Rhythm: Normal rate. Rhythm irregular.     Heart sounds: No murmur. No friction rub.  Pulmonary:     Effort: Pulmonary effort is normal.     Breath sounds: Normal breath sounds. No wheezing, rhonchi or rales.  Abdominal:     General: There is distension.     Palpations: Abdomen is soft. There is no mass.     Tenderness: There is no abdominal tenderness. There is no guarding.     Comments: Distended and tympanitic  Genitourinary:    Comments: Edematous penis Musculoskeletal:        General: Swelling present.     Right lower leg: Edema present.     Left lower leg: Edema present.     Comments: +1 pitting edema to hips bilaterally  Skin:    General: Skin is warm and dry.  Neurological:     General: No focal deficit present.     Mental Status: He is alert and oriented to person, place,  and time.  Psychiatric:        Mood and Affect: Mood normal.        Behavior: Behavior normal.     Labs and Imaging: CBC BMET  Recent Labs  Lab 04/12/19 1559  WBC 4.6  HGB 12.0*  HCT  35.1*  PLT 261   Recent Labs  Lab 04/12/19 1559  NA 120*  K 4.8  CL 89*  CO2 20*  BUN 17  CREATININE 1.67*  GLUCOSE 195*  CALCIUM 7.7*     EKG: EKG Interpretation  Date/Time:  Thursday April 12 2019 15:47:50 EST Ventricular Rate:  106 PR Interval:  166 QRS Duration: 84 QT Interval:  370 QTC Calculation: 491 R Axis:   31 Text Interpretation: Sinus tachycardia with occasional Premature ventricular complexes and Fusion complexes ST & T wave abnormality, consider lateral ischemia Abnormal ECG Confirmed by Quintella Reichert [phone]) on 04/12/2019 4:11:52 PM  Dg Chest 2 View  Result Date: 04/12/2019 CLINICAL DATA:  Leg swelling, shortness of breath. EXAM: CHEST - 2 VIEW COMPARISON:  Chest radiograph 06/02/2008 FINDINGS: Heart size within normal limits.  Aortic atherosclerosis. A triangular opacity projects in the region of the medial right lung on PA radiograph. Lungs otherwise clear. No evidence of pneumothorax or pleural effusion. No acute bony abnormality. IMPRESSION: Triangular opacity projecting in the region of the medial right lung on PA radiograph, suspicious for right middle lobe collapse and/or pneumonia. Chest CT is recommended for further evaluation and to exclude a central obstructing mass. Electronically Signed   By: Kellie Simmering DO   On: 04/12/2019 16:42    Gladys Damme, MD 04/12/2019, 7:56 PM  PGY-1, [facility] Intern pager: [phone], text pages welcome  FPTS Upper-Level Resident Addendum   I have independently interviewed and examined the patient. I have discussed the above with the original author and agree with their documentation. My edits for correction/addition/clarification are in purple. Please see also any attending notes.    [place]  Candise Crabtree, DO PGY-3, [facility] 04/12/2019 10:20 PM  FPTS Service pager: [phone] (text pages welcome through [facility])

## 2019-04-13 ENCOUNTER — Inpatient Hospital Stay (HOSPITAL_COMMUNITY): Payer: Medicare Other

## 2019-04-13 ENCOUNTER — Encounter (HOSPITAL_COMMUNITY): Payer: Self-pay | Admitting: Family Medicine

## 2019-04-13 DIAGNOSIS — I5021 Acute systolic (congestive) heart failure: Secondary | ICD-10-CM

## 2019-04-13 DIAGNOSIS — N179 Acute kidney failure, unspecified: Secondary | ICD-10-CM

## 2019-04-13 DIAGNOSIS — I5020 Unspecified systolic (congestive) heart failure: Secondary | ICD-10-CM

## 2019-04-13 DIAGNOSIS — I509 Heart failure, unspecified: Secondary | ICD-10-CM

## 2019-04-13 DIAGNOSIS — E1122 Type 2 diabetes mellitus with diabetic chronic kidney disease: Secondary | ICD-10-CM

## 2019-04-13 DIAGNOSIS — I361 Nonrheumatic tricuspid (valve) insufficiency: Secondary | ICD-10-CM

## 2019-04-13 DIAGNOSIS — I5023 Acute on chronic systolic (congestive) heart failure: Secondary | ICD-10-CM

## 2019-04-13 DIAGNOSIS — I340 Nonrheumatic mitral (valve) insufficiency: Secondary | ICD-10-CM

## 2019-04-13 DIAGNOSIS — E871 Hypo-osmolality and hyponatremia: Secondary | ICD-10-CM

## 2019-04-13 LAB — LIPID PANEL
Cholesterol: 121 mg/dL (ref 0–200)
HDL: 42 mg/dL (ref >40)
LDL Cholesterol: 64 mg/dL (ref 0–99)
Total CHOL/HDL Ratio: 2.9 RATIO
Triglycerides: 74 mg/dL (ref <150)
VLDL: 15 mg/dL (ref 0–40)

## 2019-04-13 LAB — GLUCOSE, CAPILLARY
Glucose-Capillary: 169 mg/dL — ABNORMAL HIGH (ref 70–99)
Glucose-Capillary: 208 mg/dL — ABNORMAL HIGH (ref 70–99)
Glucose-Capillary: 187 mg/dL — ABNORMAL HIGH (ref 70–99)
Glucose-Capillary: 128 mg/dL — ABNORMAL HIGH (ref 70–99)

## 2019-04-13 LAB — BASIC METABOLIC PANEL
Anion gap: 10 (ref 5–15)
Anion gap: 11 (ref 5–15)
Anion gap: 13 (ref 5–15)
BUN: 18 mg/dL (ref 8–23)
BUN: 18 mg/dL (ref 8–23)
BUN: 22 mg/dL (ref 8–23)
CO2: 19 mmol/L — ABNORMAL LOW (ref 22–32)
CO2: 21 mmol/L — ABNORMAL LOW (ref 22–32)
CO2: 20 mmol/L — ABNORMAL LOW (ref 22–32)
Calcium: 7.6 mg/dL — ABNORMAL LOW (ref 8.9–10.3)
Calcium: 7.7 mg/dL — ABNORMAL LOW (ref 8.9–10.3)
Calcium: 8.1 mg/dL — ABNORMAL LOW (ref 8.9–10.3)
Chloride: 93 mmol/L — ABNORMAL LOW (ref 98–111)
Chloride: 90 mmol/L — ABNORMAL LOW (ref 98–111)
Chloride: 89 mmol/L — ABNORMAL LOW (ref 98–111)
Creatinine, Ser: 1.55 mg/dL — ABNORMAL HIGH (ref 0.61–1.24)
Creatinine, Ser: 1.58 mg/dL — ABNORMAL HIGH (ref 0.61–1.24)
Creatinine, Ser: 1.76 mg/dL — ABNORMAL HIGH (ref 0.61–1.24)
GFR calc Af Amer: 52 mL/min — ABNORMAL LOW (ref >60)
GFR calc Af Amer: 51 mL/min — ABNORMAL LOW (ref >60)
GFR calc Af Amer: 45 mL/min — ABNORMAL LOW (ref >60)
GFR calc non Af Amer: 45 mL/min — ABNORMAL LOW (ref >60)
GFR calc non Af Amer: 44 mL/min — ABNORMAL LOW (ref >60)
GFR calc non Af Amer: 39 mL/min — ABNORMAL LOW (ref >60)
Glucose, Bld: 193 mg/dL — ABNORMAL HIGH (ref 70–99)
Glucose, Bld: 208 mg/dL — ABNORMAL HIGH (ref 70–99)
Glucose, Bld: 180 mg/dL — ABNORMAL HIGH (ref 70–99)
Potassium: 4.5 mmol/L (ref 3.5–5.1)
Potassium: 4.2 mmol/L (ref 3.5–5.1)
Potassium: 4.5 mmol/L (ref 3.5–5.1)
Sodium: 122 mmol/L — ABNORMAL LOW (ref 135–145)
Sodium: 122 mmol/L — ABNORMAL LOW (ref 135–145)
Sodium: 122 mmol/L — ABNORMAL LOW (ref 135–145)

## 2019-04-13 LAB — HEPATITIS PANEL, ACUTE
HCV Ab: NONREACTIVE
Hep A IgM: NONREACTIVE
Hep B C IgM: NONREACTIVE
Hepatitis B Surface Ag: NONREACTIVE

## 2019-04-13 LAB — HEMOGLOBIN A1C
Hgb A1c MFr Bld: 7.4 % — ABNORMAL HIGH (ref 4.8–5.6)
Mean Plasma Glucose: 165.68 mg/dL

## 2019-04-13 LAB — ALBUMIN: Albumin: 3.3 g/dL — ABNORMAL LOW (ref 3.5–5.0)

## 2019-04-13 LAB — ECHOCARDIOGRAM COMPLETE
Height: 69 in
Weight: 3656 oz

## 2019-04-13 LAB — HIV ANTIBODY (ROUTINE TESTING W REFLEX): HIV Screen 4th Generation wRfx: NONREACTIVE

## 2019-04-13 LAB — MAGNESIUM: Magnesium: 1.8 mg/dL (ref 1.7–2.4)

## 2019-04-13 LAB — TSH: TSH: 2.258 u[IU]/mL (ref 0.350–4.500)

## 2019-04-13 MED ORDER — ENOXAPARIN SODIUM 40 MG/0.4ML ~~LOC~~ SOLN
40.0000 mg | SUBCUTANEOUS | Status: DC
  Administered 2019-04-13 – 2019-04-15 (×3): 40 mg via SUBCUTANEOUS
  Filled 2019-04-13 (×3): qty 0.4

## 2019-04-13 MED ORDER — SODIUM CHLORIDE 0.9% FLUSH
3.0000 mL | Freq: Two times a day (BID) | INTRAVENOUS | Status: DC
  Administered 2019-04-13 – 2019-04-16 (×5): 3 mL via INTRAVENOUS

## 2019-04-13 MED ORDER — ASPIRIN 81 MG PO CHEW: 81.0000 mg | CHEWABLE_TABLET | ORAL | Status: AC

## 2019-04-13 MED ORDER — SODIUM CHLORIDE 0.9% FLUSH: 3.0000 mL | INTRAVENOUS | Status: DC | PRN

## 2019-04-13 MED ORDER — FUROSEMIDE 8 MG/ML PO SOLN
40.0000 mg | Freq: Once | ORAL | Status: DC
  Filled 2019-04-13 (×2): qty 5

## 2019-04-13 MED ORDER — SODIUM CHLORIDE 0.9 % IV SOLN: 250.0000 mL | INTRAVENOUS | Status: DC | PRN

## 2019-04-13 MED ORDER — FUROSEMIDE 10 MG/ML IJ SOLN
40.0000 mg | Freq: Once | INTRAMUSCULAR | Status: AC
  Administered 2019-04-13: 40 mg via INTRAVENOUS
  Filled 2019-04-13: qty 4

## 2019-04-13 MED ORDER — GUAIFENESIN-DM 100-10 MG/5ML PO SYRP
5.0000 mL | ORAL_SOLUTION | ORAL | Status: DC | PRN
  Administered 2019-04-13: 5 mL via ORAL
  Filled 2019-04-13: qty 5

## 2019-04-13 MED ORDER — FUROSEMIDE 10 MG/ML PO SOLN
40.0000 mg | Freq: Once | ORAL | Status: AC
  Administered 2019-04-13: 40 mg via ORAL
  Filled 2019-04-13 (×2): qty 4

## 2019-04-13 MED ORDER — METOPROLOL TARTRATE 25 MG PO TABS
25.0000 mg | ORAL_TABLET | Freq: Two times a day (BID) | ORAL | Status: DC
  Administered 2019-04-13 – 2019-04-14 (×3): 25 mg via ORAL
  Filled 2019-04-13 (×3): qty 1

## 2019-04-13 MED ORDER — ENOXAPARIN SODIUM 40 MG/0.4ML ~~LOC~~ SOLN: 40.0000 mg | SUBCUTANEOUS | Status: DC

## 2019-04-13 NOTE — ED Notes (Signed)
ED TO INPATIENT HANDOFF REPORT  ED Nurse Name and Phone #: Liliane Mallis [phone]  S Name/Age/Gender [name] 68 y.o. male Room/Bed: 014C/014C  Code Status   Code Status: Full Code  Home/SNF/Other Home Patient oriented to: self, place, time and situation Is this baseline? Yes   Triage Complete: Triage complete  Chief Complaint Legs Swelling  Triage Note Pt here for evaluation of bilateral leg and groin swelling for several months. Pt sts he ran out of his bp meds a few months ago. Pt denies pain, endorses intermittent shob with exertion today.    Allergies Allergies  Allergen Reactions  . Lisinopril Swelling    Lip swelling  . Other Other (See Comments)    Seasonal allergies    Level of Care/Admitting Diagnosis ED Disposition    ED Disposition Condition Comment   Admit  Hospital Area: [facility] [[id number]]  Level of Care: Telemetry Cardiac [103]  Covid Evaluation: Confirmed COVID Negative  Diagnosis: CHF (congestive heart failure) [id number]  Admitting Physician: Gladys Damme [id number]  Attending Physician: Martyn Malay [id number]  Estimated length of stay: past midnight tomorrow  Certification:: I certify this patient Kiaan need inpatient services for at least 2 midnights  PT Class (Do Not Modify): Inpatient [101]  PT Acc Code (Do Not Modify): Private [1]       B Medical/Surgery History Past Medical History:  Diagnosis Date  . Diabetes mellitus without complication ([place])   . Hypertension    No past surgical history on file.   A IV Location/Drains/Wounds Patient Lines/Drains/Airways Status   Active Line/Drains/Airways    Name:   Placement date:   Placement time:   Site:   Days:   Peripheral IV 04/12/19 Left Antecubital   04/12/19    2252    Antecubital   1          Intake/Output Last 24 hours  Intake/Output Summary (Last 24 hours) at 04/13/2019 0246 Last data filed at 04/12/2019 2234 Gross per 24 hour  Intake -  Output  750 ml  Net -750 ml    Labs/Imaging Results for orders placed or performed during the hospital encounter of 04/12/19 (from the past 48 hour(s))  Basic metabolic panel     Status: Abnormal   Collection Time: 04/12/19  3:59 PM  Result Value Ref Range   Sodium 120 (L) 135 - 145 mmol/L   Potassium 4.8 3.5 - 5.1 mmol/L   Chloride 89 (L) 98 - 111 mmol/L   CO2 20 (L) 22 - 32 mmol/L   Glucose, Bld 195 (H) 70 - 99 mg/dL   BUN 17 8 - 23 mg/dL   Creatinine, Ser 1.67 (H) 0.61 - 1.24 mg/dL   Calcium 7.7 (L) 8.9 - 10.3 mg/dL   GFR calc non Af Amer 41 (L) >60 mL/min   GFR calc Af Amer 48 (L) >60 mL/min   Anion gap 11 5 - 15    Comment: Performed at [facility], [address], [place], [place] [address]  CBC     Status: Abnormal   Collection Time: 04/12/19  3:59 PM  Result Value Ref Range   WBC 4.6 4.0 - 10.5 K/uL   RBC 4.10 (L) 4.22 - 5.81 MIL/uL   Hemoglobin 12.0 (L) 13.0 - 17.0 g/dL   HCT 35.1 (L) 39.0 - 52.0 %   MCV 85.6 80.0 - 100.0 fL   MCH 29.3 26.0 - 34.0 pg   MCHC 34.2 30.0 - 36.0 g/dL  RDW 13.9 11.5 - 15.5 %   Platelets 261 150 - 400 K/uL   nRBC 0.0 0.0 - 0.2 %    Comment: Performed at [facility], [place] [address], [place], [place] [address]  Brain natriuretic peptide     Status: Abnormal   Collection Time: 04/12/19  3:59 PM  Result Value Ref Range   B Natriuretic Peptide 2,832.0 (H) 0.0 - 100.0 pg/mL    Comment: Performed at [facility] [address], [place], [place] [address]  Magnesium     Status: Abnormal   Collection Time: 04/12/19  4:04 PM  Result Value Ref Range   Magnesium 1.6 (L) 1.7 - 2.4 mg/dL    Comment: Performed at [facility] [address] [place], [place] [address]  Urinalysis, Routine w reflex microscopic     Status: Abnormal   Collection Time: 04/12/19  4:42 PM  Result Value Ref Range   Color, Urine YELLOW YELLOW   APPearance CLEAR CLEAR   Specific Gravity, Urine 1.016 1.005 - 1.030   pH 5.0 5.0 - 8.0   Glucose, UA  NEGATIVE NEGATIVE mg/dL   Hgb urine dipstick NEGATIVE NEGATIVE   Bilirubin Urine NEGATIVE NEGATIVE   Ketones, ur 5 (A) NEGATIVE mg/dL   Protein, ur 100 (A) NEGATIVE mg/dL   Nitrite NEGATIVE NEGATIVE   Leukocytes,Ua NEGATIVE NEGATIVE   RBC / HPF 0-5 0 - 5 RBC/hpf   WBC, UA 0-5 0 - 5 WBC/hpf   Bacteria, UA NONE SEEN NONE SEEN   Squamous Epithelial / LPF 0-5 0 - 5   Hyaline Casts, UA PRESENT    Granular Casts, UA PRESENT     Comment: Performed at [facility] [address] [place], [place] [address]  Protein / creatinine ratio, urine     Status: Abnormal   Collection Time: 04/12/19  4:42 PM  Result Value Ref Range   Creatinine, Urine 151.53 mg/dL   Total Protein, Urine 253 mg/dL    Comment: NO NORMAL RANGE ESTABLISHED FOR THIS TEST RESULTS CONFIRMED BY MANUAL DILUTION    Protein Creatinine Ratio 1.67 (H) 0.00 - 0.15 mg/mg[Cre]    Comment: Performed at [facility] [address] [place],  [address]  Sodium, urine, random     Status: None   Collection Time: 04/12/19  4:42 PM  Result Value Ref Range   Sodium, Ur 30 mmol/L    Comment: Performed at [facility] [address] [place], [place] [address]  Osmolality, urine     Status: None   Collection Time: 04/12/19  5:04 PM  Result Value Ref Range   Osmolality, Ur 528 300 - 900 mOsm/kg    Comment: Performed at [facility] [address], [place], [place] [address]  SARS CORONAVIRUS 2 (TAT 6-24 HRS) Nasopharyngeal Nasopharyngeal Swab     Status: None   Collection Time: 04/12/19  5:04 PM   Specimen: Nasopharyngeal Swab  Result Value Ref Range   SARS Coronavirus 2 NEGATIVE NEGATIVE    Comment: (NOTE) SARS-CoV-2 target nucleic acids are NOT DETECTED. The SARS-CoV-2 RNA is generally detectable in upper and lower respiratory specimens during the acute phase of infection. Negative results do not preclude SARS-CoV-2 infection, do not rule out co-infections with other pathogens, and should not be  used as the sole basis for treatment or other patient management decisions. Negative results must be combined with clinical observations, patient history, and epidemiological information. The expected result  is Negative. Fact Sheet for Patients: SugarRoll.be Fact Sheet for Healthcare Providers: https://www.woods-mathews.com/ This test is not yet approved or cleared by the [place] FDA and  has been authorized for detection and/or diagnosis of SARS-CoV-2 by FDA under an Emergency Use Authorization (EUA). This EUA M[place]n remain  in effect (meaning this test can be used) for the duration of the COVID-19 declaration under Section 56 4(b)(1) of the Act, 21 U.S.C. section 360bbb-3(b)(1), unless the authorization is terminated or revoked sooner. Performed at [facility], [place] [address] [place], [place] [address]   Hepatic function panel     Status: Abnormal   Collection Time: 04/12/19  8:22 PM  Result Value Ref Range   Total Protein 7.1 6.5 - 8.1 g/dL   Albumin 3.3 (L) 3.5 - 5.0 g/dL   AST 48 (H) 15 - 41 U/L   ALT 42 0 - 44 U/L   Alkaline Phosphatase 102 38 - 126 U/L   Total Bilirubin 1.0 0.3 - 1.2 mg/dL   Bilirubin, Direct 0.2 0.0 - 0.2 mg/dL   Indirect Bilirubin 0.8 0.3 - 0.9 mg/dL    Comment: Performed at [facility] [address], [place],  [address]  CBG monitoring, ED     Status: Abnormal   Collection Time: 04/12/19  9:40 PM  Result Value Ref Range   Glucose-Capillary 167 (H) 70 - 99 mg/dL   Dg Chest 2 View  Result Date: 04/12/2019 CLINICAL DATA:  Leg swelling, shortness of breath. EXAM: CHEST - 2 VIEW COMPARISON:  Chest radiograph 06/02/2008 FINDINGS: Heart size within normal limits.  Aortic atherosclerosis. A triangular opacity projects in the region of the medial right lung on PA radiograph. Lungs otherwise clear. No evidence of pneumothorax or pleural effusion. No acute bony abnormality. IMPRESSION:  Triangular opacity projecting in the region of the medial right lung on PA radiograph, suspicious for right middle lobe collapse and/or pneumonia. Chest CT is recommended for further evaluation and to exclude a central obstructing mass. Electronically Signed   By: Kellie Simmering DO   On: 04/12/2019 16:42   Ct Chest W Contrast  Result Date: 04/12/2019 CLINICAL DATA:  Chest x-ray concerning for mass. EXAM: CT CHEST WITH CONTRAST TECHNIQUE: Multidetector CT imaging of the chest was performed during intravenous contrast administration. CONTRAST:  18mL OMNIPAQUE IOHEXOL 300 MG/ML  SOLN COMPARISON:  Chest radiograph 04/12/2019 FINDINGS: Cardiovascular: Mild cardiomegaly. Calcified coronary artery disease. No thoracic aortic aneurysm. There is reflux of contrast into the inferior vena cava and hepatic veins. Mediastinum/Nodes: -- No enlarged mediastinal, hilar or axillary lymph nodes. -- The visualized thyroid gland is unremarkable. Lungs/Pleura: Mild ground-glass opacity within the dependent lower lobes bilaterally which likely reflects edema. Small bilateral pleural effusions. No airspace consolidation. No pneumothorax. The central airways are grossly patent. Upper Abdomen: Ascites within partially imaged upper abdomen. Musculoskeletal: No acute bony abnormality. IMPRESSION: No intrathoracic mass. No findings to account for the chest x-ray appearance earlier today. Mild cardiomegaly with coronary artery disease. Reflux of contrast into the IVC and hepatic veins, a finding which may be seen in the setting of heart failure. Mild bilateral lower lobe pulmonary edema with small bilateral pleural effusions. Ascites within the partially imaged upper abdomen. Electronically Signed   By: Kellie Simmering DO   On: 04/12/2019 21:39    Pending Labs Unresulted Labs (From admission, onward)    Start     Ordered   04/14/19 [id number]  Basic metabolic panel  Daily,   R  04/12/19 2130   04/13/19 0500  HIV Antibody (routine testing w  rflx)  (HIV Antibody (Routine testing w reflex) panel)  Tomorrow morning,   R     04/12/19 2121   04/13/19 0500  Magnesium  Tomorrow morning,   R     04/12/19 2341   04/13/19 0500  Hemoglobin A1c  Tomorrow morning,   R    Comments: To assess prior glycemic control    04/12/19 2118   04/13/19 [id number]  Basic metabolic panel  Now then every 6 hours,   R (with STAT occurrences)     04/12/19 2132          Vitals/Pain Today's Vitals   04/12/19 2300 04/12/19 2330 04/13/19 0030 04/13/19 0100  BP: (!) 152/114 (!) 143/111 (!) 143/117 (!) 146/102  Pulse: 86 93  90  Resp: (!) 25 (!) 25 (!) 24 (!) 23  Temp:      TempSrc:      SpO2: 100% 98%  97%  PainSc:        Isolation Precautions No active isolations  Medications Medications  sodium chloride flush (NS) 0.9 % injection 3 mL (has no administration in time range)  sodium chloride flush (NS) 0.9 % injection 3 mL (has no administration in time range)  0.9 %  sodium chloride infusion (has no administration in time range)  acetaminophen (TYLENOL) tablet 650 mg (has no administration in time range)  insulin aspart (novoLOG) injection 0-6 Units (has no administration in time range)  calcium carbonate (TUMS - dosed in mg elemental calcium) chewable tablet 800 mg of elemental calcium (800 mg of elemental calcium Oral Refused 04/12/19 2343)  enoxaparin (LOVENOX) injection 40 mg (has no administration in time range)  magnesium oxide (MAG-OX) tablet 400 mg (400 mg Oral Given 04/12/19 1812)  furosemide (LASIX) injection 40 mg (40 mg Intravenous Given 04/12/19 2046)  iohexol (OMNIPAQUE) 300 MG/ML solution 75 mL (75 mLs Intravenous Contrast Given 04/12/19 2116)    Mobility walks with person assist High fall risk   Focused Assessments Cardiac Assessment Handoff:  Cardiac Rhythm: Sinus tachycardia Lab Results  Component Value Date   CKTOTAL 501 (H) 02/19/2007   CKMB 2.0 02/19/2007   TROPONINI 0.03        NO INDICATION OF MYOCARDIAL INJURY.  02/19/2007   No results found for: DDIMER Does the Patient currently have chest pain? No     R Recommendations: See Admitting Provider Note  Report given to:   Additional Notes:

## 2019-04-13 NOTE — Progress Notes (Signed)
CSW received consult from resident stating:  "Imminent discharge; patient reported turning on CPAP at home flips breaker and makes lights go out"  CSW is unable to fix electrical issues in patient's home.   [place], [place]

## 2019-04-13 NOTE — Progress Notes (Signed)
Pt had a run of SVT, no s/s,, MD notified, Ru[facility] continue to monitor, Thanks Arvella Nigh RN.

## 2019-04-13 NOTE — Progress Notes (Signed)
  Echocardiogram 2D Echocardiogram has been performed.  [name] 04/13/2019, 12:12 PM

## 2019-04-13 NOTE — Evaluation (Addendum)
Physical Therapy Evaluation Patient Details Name: [name] MRN: [id number] DOB: [birth year] Today's Date: 04/13/2019   History of Present Illness  [name] is a 68 y.o. male presenting with leg swelling, shortness of breath. PMH is significant for CHFejection fraction of 25 to 30%, DM2, HTN.  Admitted with acute CHF, possible AKI, and HTN  Clinical Impression  Pt admitted with above diagnosis. Pt was able to ambulate without AD but demonstrate mild deficits in balance and decreased endurance with [facility] requiring 2 rest breaks.   Pt currently with functional limitations due to the deficits listed below (see PT Problem List). Pt Schon benefit from skilled PT to increase their independence and safety with mobility to allow discharge to the venue listed below.       Follow Up Recommendations No PT follow up;Supervision - Intermittent May benefit from cardiac rehab    Equipment Recommendations  None recommended by PT    Recommendations for Other Services       Precautions / Restrictions Precautions Precautions: Fall Restrictions Weight Bearing Restrictions: No      Mobility  Bed Mobility Overal bed mobility: Independent Bed Mobility: Sit to Supine       Sit to supine: Supervision   General bed mobility comments: for safety, increased time/effort  Transfers Overall transfer level: Needs assistance Equipment used: None Transfers: Sit to/from Stand Sit to Stand: Supervision         General transfer comment: increased time/effort  Ambulation/Gait Ambulation/Gait assistance: Min assist Gait Distance (Feet): 150 Feet Assistive device: None Gait Pattern/deviations: Step-through pattern;Drifts right/left;Staggering left;Staggering right;Wide base of support Gait velocity: decreased   General Gait Details: Pt required 2 standing rest breaks due to fatigue and SHOB;  he ambulated iwth Wide BOS due to scrotal edema; did drift R/L and had LOB with looking  up/down/L/R  Stairs            Wheelchair Mobility    Modified Rankin (Stroke Patients Only)       Balance Overall balance assessment: Needs assistance Sitting-balance support: No upper extremity supported;Feet supported Sitting balance-Leahy Scale: Normal     Standing balance support: No upper extremity supported;During functional activity Standing balance-Leahy Scale: Fair                               Pertinent Vitals/Pain Pain Assessment: No/denies pain    Home Living Family/patient expects to be discharged to:: Private residence Living Arrangements: Children Available Help at Discharge: Family;Available PRN/intermittently Type of Home: House Home Access: Stairs to enter Entrance Stairs-Rails: Psychiatric nurse of Steps: 2 Home Layout: One level Home Equipment: None      Prior Function Level of Independence: Independent         Comments: likes to lift weights; drives; completely independent;  lives with son but he works during day     Journalist, newspaper        Extremity/Trunk Assessment   Upper Extremity Assessment Upper Extremity Assessment: Defer to OT evaluation    Lower Extremity Assessment Lower Extremity Assessment: RLE deficits/detail;LLE deficits/detail RLE Deficits / Details: Strength 5/5 and ROM WFL;  pitting edema throughout (pt reports limited ankle ROM compared to baseline) LLE Deficits / Details: Strength 5/5 and ROM WFL;  pitting edema throughout (pt reports limited ankle ROM compared to baseline)    Cervical / Trunk Assessment Cervical / Trunk Assessment: Normal  Communication   Communication: No difficulties  Cognition Arousal/Alertness: Awake/alert Behavior During  Therapy: WFL for tasks assessed/performed Overall Cognitive Status: Within Functional Limits for tasks assessed                                        General Comments General comments (skin integrity, edema, etc.):  Pt's O2 sats 96-98% throughout session; HR 98-105 bpm;  BP supine 143/95; sit 154/100; walk 155/103 (notified RN of elevated diastolic,  pt has had elevated bp over the past 2 days)    Exercises     Assessment/Plan    PT Assessment Patient needs continued PT services  PT Problem List Decreased mobility;Decreased range of motion;Decreased activity tolerance;Cardiopulmonary status limiting activity;Decreased balance       PT Treatment Interventions DME instruction;Therapeutic exercise;Gait training;Balance training;Stair training;Functional mobility training;Therapeutic activities;Patient/family education    PT Goals (Current goals can be found in the Care Plan section)  Acute Rehab PT Goals Patient Stated Goal: home when able PT Goal Formulation: With patient Time For Goal Achievement: 04/27/19 Potential to Achieve Goals: Good    Frequency Min 3X/week   Barriers to discharge        Co-evaluation               AM-PAC PT "6 Clicks" Mobility  Outcome Measure Help needed turning from your back to your side while in a flat bed without using bedrails?: None Help needed moving from lying on your back to sitting on the side of a flat bed without using bedrails?: None Help needed moving to and from a bed to a chair (including a wheelchair)?: None Help needed standing up from a chair using your arms (e.g., wheelchair or bedside chair)?: None Help needed to walk in hospital room?: A Little Help needed climbing 3-5 steps with a railing? : A Little 6 Click Score: 22    End of Session Equipment Utilized During Treatment: Gait belt Activity Tolerance: Patient limited by fatigue Patient left: in bed;with call bell/phone within reach(alarm off on arrival pt independent getting to EOB to use urinal) Nurse Communication: Mobility status(BP) PT Visit Diagnosis: Unsteadiness on feet (R26.81);Other abnormalities of gait and mobility (R26.89)    Time: 1030-1059 PT Time Calculation (min)  (ACUTE ONLY): 29 min   Charges:   PT Evaluation $PT Eval Moderate Complexity: 1 Mod          Maggie Font, PT Acute Rehab Services Pager [phone] [facility] [phone] [facility] [phone]   Karlton Lemon 04/13/2019, 11:34 AM

## 2019-04-13 NOTE — Consult Note (Signed)
CARDIOLOGY CONSULT NOTE       Patient ID: [name] MRN: [id number] DOB/AGE: [name] 68 y.o.  Admit date: 04/12/2019 Referring Physician: [name]  Primary Physician: [name], PA-C Primary Cardiologist: Claudie Leach at [facility] Reason for Consultation: CHF  Active Problems:   CHF exacerbation ([name])   HFrEF (heart failure with reduced ejection fraction) (HCC)   CHF (congestive heart failure) ([place])   HPI:  68 y.o. black male with history of CHF admitted with volume overload , edema and dyspnea. No chest pain. BNP over 2000 ECG non acute tachycardic. He has poor financial situation and has not been taking any meds other than his glucophage He had abnormal myovue in November 2019 with EF 24% apical infarct and ? Ischemia in inferior wall and peri infarct. Never had heart cath. Patient has poor diet. Poor insight into diagnosis of CHF. Lives with his son but does not work. No syncope palpitations Has had 5 lb weight gain over last week Too much salt in diet CT chest had reflux of dye intor IVC and hepatic veins as well as some ascites   ROS All other systems reviewed and negative except as noted above  Past Medical History:  Diagnosis Date   Diabetes mellitus without complication (HCC)    Hypertension     Family History  Problem Relation Age of Onset   Heart failure Mother    Heart failure Brother     Social History   Socioeconomic History   Marital status: Divorced    Spouse name: Not on file   Number of children: Not on file   Years of education: Not on file   Highest education level: Not on file  Occupational History   Not on file  Social Needs   Financial resource strain: Not on file   Food insecurity    Worry: Not on file    Inability: Not on file   Transportation needs    Medical: Not on file    Non-medical: Not on file  Tobacco Use   Smoking status: Never Smoker   Smokeless tobacco: Never Used  Substance and Sexual Activity     Alcohol use: No   Drug use: No   Sexual activity: Not on file  Lifestyle   Physical activity    Days per week: Not on file    Minutes per session: Not on file   Stress: Not on file  Relationships   Social connections    Talks on phone: Not on file    Gets together: Not on file    Attends religious service: Not on file    Active member of club or organization: Not on file    Attends meetings of clubs or organizations: Not on file    Relationship status: Not on file   Intimate partner violence    Fear of current or ex partner: Not on file    Emotionally abused: Not on file    Physically abused: Not on file    Forced sexual activity: Not on file  Other Topics Concern   Not on file  Social History Narrative   Not on file    No past surgical history on file.    calcium carbonate  800 mg of elemental calcium Oral BID WC   enoxaparin (LOVENOX) injection  40 mg Subcutaneous Q24H   insulin aspart  0-6 Units Subcutaneous TID WC   sodium chloride flush  3 mL Intravenous Q12H    sodium chloride  Physical Exam: Blood pressure (!) 151/105, pulse (!) 102, temperature 98.3 F (36.8 C), temperature source Axillary, resp. rate 20, height 5\' 9"  (1.753 m), weight 103.6 kg, SpO2 100 %.   Poor ability to understand things  Chronically ill black male  HEENT: normal Neck supple with no adenopathy JVP elevated no bruits no thyromegaly Lungs clear with no wheezing and good diaphragmatic motion Heart:  S1/S2 no murmur, no rub, gallop or click PMI enlarged  Abdomen: benighn, BS positve, no tenderness, no AAA distended ? Ascites  no bruit.  No HSM or HJR Distal pulses intact with no bruits Plus 3 bilateral edema Neuro non-focal Skin warm and dry No muscular weakness   Labs:   Lab Results  Component Value Date   WBC 4.6 04/12/2019   HGB 12.0 (L) 04/12/2019   HCT 35.1 (L) 04/12/2019   MCV 85.6 04/12/2019   PLT 261 04/12/2019    Recent Labs  Lab 04/12/19 2022   04/13/19 0817  NA  --    < > 122*  K  --    < > 4.2  CL  --    < > 90*  CO2  --    < > 21*  BUN  --    < > 18  CREATININE  --    < > 1.58*  CALCIUM  --    < > 7.7*  PROT 7.1  --   --   BILITOT 1.0  --   --   ALKPHOS 102  --   --   ALT 42  --   --   AST 48*  --   --   GLUCOSE  --    < > 208*   < > = values in this interval not displayed.   Lab Results  Component Value Date   CKTOTAL 501 (H) 02/19/2007   CKMB 2.0 02/19/2007   TROPONINI 0.03        NO INDICATION OF MYOCARDIAL INJURY. 02/19/2007    Lab Results  Component Value Date   CHOL 155 11/20/2008   CHOL  02/19/2007    132        ATP III CLASSIFICATION:  <200     mg/dL   Desirable  200-239  mg/dL   Borderline High  >=240    mg/dL   High   Lab Results  Component Value Date   HDL 54 11/20/2008   HDL 41 02/19/2007   Lab Results  Component Value Date   LDLCALC 80 11/20/2008   [place]  02/19/2007    77        Total Cholesterol/HDL:CHD Risk Coronary Heart Disease Risk Table                     Men   Women  1/2 Average Risk   3.4   3.3   Lab Results  Component Value Date   TRIG 103 11/20/2008   TRIG 68 02/19/2007   Lab Results  Component Value Date   CHOLHDL 2.9 Ratio 11/20/2008   CHOLHDL 3.2 02/19/2007   No results found for: LDLDIRECT    Radiology: Dg Chest 2 View  Result Date: 04/12/2019 CLINICAL DATA:  Leg swelling, shortness of breath. EXAM: CHEST - 2 VIEW COMPARISON:  Chest radiograph 06/02/2008 FINDINGS: Heart size within normal limits.  Aortic atherosclerosis. A triangular opacity projects in the region of the medial right lung on PA radiograph. Lungs otherwise clear. No evidence of pneumothorax or pleural effusion. No acute bony  abnormality. IMPRESSION: Triangular opacity projecting in the region of the medial right lung on PA radiograph, suspicious for right middle lobe collapse and/or pneumonia. Chest CT is recommended for further evaluation and to exclude a central obstructing mass.  Electronically Signed   By: Kellie Simmering DO   On: 04/12/2019 16:42   Ct Chest W Contrast  Result Date: 04/12/2019 CLINICAL DATA:  Chest x-ray concerning for mass. EXAM: CT CHEST WITH CONTRAST TECHNIQUE: Multidetector CT imaging of the chest was performed during intravenous contrast administration. CONTRAST:  76mL OMNIPAQUE IOHEXOL 300 MG/ML  SOLN COMPARISON:  Chest radiograph 04/12/2019 FINDINGS: Cardiovascular: Mild cardiomegaly. Calcified coronary artery disease. No thoracic aortic aneurysm. There is reflux of contrast into the inferior vena cava and hepatic veins. Mediastinum/Nodes: -- No enlarged mediastinal, hilar or axillary lymph nodes. -- The visualized thyroid gland is unremarkable. Lungs/Pleura: Mild ground-glass opacity within the dependent lower lobes bilaterally which likely reflects edema. Small bilateral pleural effusions. No airspace consolidation. No pneumothorax. The central airways are grossly patent. Upper Abdomen: Ascites within partially imaged upper abdomen. Musculoskeletal: No acute bony abnormality. IMPRESSION: No intrathoracic mass. No findings to account for the chest x-ray appearance earlier today. Mild cardiomegaly with coronary artery disease. Reflux of contrast into the IVC and hepatic veins, a finding which may be seen in the setting of heart failure. Mild bilateral lower lobe pulmonary edema with small bilateral pleural effusions. Ascites within the partially imaged upper abdomen. Electronically Signed   By: Kellie Simmering DO   On: 04/12/2019 21:39    EKG: ST rate 106 PVC poor R wave progression    ASSESSMENT AND PLAN:   1.  CHF chronic systolic with acute exacerbation Echo pending but has significant right heart failure as well on exam Has been non compliant with meds due to finances and doubt he could afford entresto. He declined heart cath 2019 and is still unsure if he wants to proceed with right and left cath I had lengthy discussion about need for this and Rachel  tentatively schedule for Monday He has "angioedema" / swelling listed as allergy to ACE Currently not written for diuretics hydralazine or nitrates Tonnie start. Follow over weekend Cr at baseline is 1.58 K 4.2 need fluid restriction with Na 122   2. HtN:  Elevated see above   3. DM:  Poor control A1c 7.4 glucophage needs to be helf post cath for 24 hours   Outpatient f/u Atharva be with Dr Claudie Leach at [place]: [place] 04/13/2019, 9:51 AM

## 2019-04-13 NOTE — Progress Notes (Signed)
Family Medicine Teaching Service Daily Progress Note Intern Pager: [phone]  Patient name: [name] Medical record number: [id number] Date of birth: 1950/07/20 Age: 68 y.o. Gender: male  Primary Care Provider: [name], PA-C Consultants: Cardiology  Code Status: FULL   Pt Overview and Major Events to Date:  04/12/19: Admitted  04/13/19: cardiology consulted   Assessment and Plan:   Macin Czarnecki is a 68 y.o. male presenting with leg swelling, shortness of breath. PMH is significant for HFrEF, T2DM, HTN.  Anasarca  Acute on chronic HFrEF  Patient dyspneic on exam with bilateral lower extremity edema to the thigh.  BNP onadmission ~2800.  ECHO today 20 to 25% with severely decreased LV function, mild LVH, mildly increased LV septal wall thickness diffuse hypokinesis.  RV mildly reduced systolic function mildly enlarged. Patient takes HCTZ at home.  Reports angioedema with lisinopril in the past.  Rodricus initiate beta-blocker, spironolactone this admission.  Patient diuresed 1.6 L yesterday with 40 mg IV Lasix.  Continue with aggressive diuresis.   Monitor creatinine ( Cr 1.55 today from 1.67 yesterday).  Per cardiology, patient with right heart failure plan to have heart cath on Monday (if patient agrees).   - Cardiology following, appreciate recommendations - AM CBC / BMP / Mg  - Lasix 40 mg IV twice daily, consider additional dose if creatinine allows -Need to initiate Spironolactone and beta-blocker - PT/ OT: no follow up rec'd  -Follow-up a.m. EKG  Hyponatremia, hypervolemia Sodium on admission 120 and today 122.  Likely due to volume overload.  On exam, patient with bilateral lower extremity edema to the thigh. -Continue to monitor -Daily BMP   AKI  Creatinine on admission 1.67 and is improving today at 1.55.  Baseline appears to be around 1.1-1.2.  -Encourage p.o. hydration -Daily BMP -Avoid nephrotoxic agents  HTN  BP today 151/124, hypertensive.  Home  medications include HCTZ.  -Monitor blood pressure  T2DM  Fasting glucose today 208. Patient received 3 sliding scale insulin.  Home medication includes 1000 mg of Metformin twice daily.  Social Concerns Patient has difficulty accessing PCP and medications due to cost, however patient likely qualifies for Medicare. -Consulted social work and case management as appropriate to help patient access appropriate care and medications for long-term management  FEN/GI: Replete electrolytes as needed, heart healthy carb modified diet PPx: Lovenox  Disposition: Likely home pending medical clearance   Subjective:  [name] denies current chest pain.  Endorses dyspnea on exertion.  Objective: Temp:  [98.2 F (36.8 C)-98.3 F (36.8 C)] 98.3 F (36.8 C) (11/27 0754) Pulse Rate:  [86-103] 102 (11/27 0754) Resp:  [16-29] 20 (11/27 0754) BP: (138-169)/(96-119) 151/105 (11/27 0754) SpO2:  [93 %-100 %] 100 % (11/27 0754) Weight:  [103.6 kg] 103.6 kg (11/27 0343)  Physical Exam: General: Conversational elderly male, alert, in no acute distress Cardiovascular: Tachycardic, irregular rhythm, distal pulses intact Respiratory: Pausing during sentences, clear to auscultation bilaterally Abdomen: Rounded w/ no appreciable fluid wave, nontender, soft, bowel sounds present Extremities: Bilateral pitting edema to the thigh, normal range of motion  Laboratory: Recent Labs  Lab 04/12/19 1559  WBC 4.6  HGB 12.0*  HCT 35.1*  PLT 261   Recent Labs  Lab 04/12/19 1559 04/12/19 2022 04/13/19 0001 04/13/19 0817  NA 120*  --  122* 122*  K 4.8  --  4.5 4.2  CL 89*  --  93* 90*  CO2 20*  --  19* 21*  BUN 17  --  18 18  CREATININE 1.67*  --  1.55* 1.58*  CALCIUM 7.7*  --  7.6* 7.7*  PROT  --  7.1  --   --   BILITOT  --  1.0  --   --   ALKPHOS  --  102  --   --   ALT  --  42  --   --   AST  --  48*  --   --   GLUCOSE 195*  --  193* 208*      Imaging/Diagnostic Tests: Dg Chest 2  View  Result Date: 04/12/2019 CLINICAL DATA:  Leg swelling, shortness of breath. EXAM: CHEST - 2 VIEW COMPARISON:  Chest radiograph 06/02/2008 FINDINGS: Heart size within normal limits.  Aortic atherosclerosis. A triangular opacity projects in the region of the medial right lung on PA radiograph. Lungs otherwise clear. No evidence of pneumothorax or pleural effusion. No acute bony abnormality. IMPRESSION: Triangular opacity projecting in the region of the medial right lung on PA radiograph, suspicious for right middle lobe collapse and/or pneumonia. Chest CT is recommended for further evaluation and to exclude a central obstructing mass. Electronically Signed   By: Kellie Simmering DO   On: 04/12/2019 16:42   Ct Chest W Contrast  Result Date: 04/12/2019 CLINICAL DATA:  Chest x-ray concerning for mass. EXAM: CT CHEST WITH CONTRAST TECHNIQUE: Multidetector CT imaging of the chest was performed during intravenous contrast administration. CONTRAST:  88mL OMNIPAQUE IOHEXOL 300 MG/ML  SOLN COMPARISON:  Chest radiograph 04/12/2019 FINDINGS: Cardiovascular: Mild cardiomegaly. Calcified coronary artery disease. No thoracic aortic aneurysm. There is reflux of contrast into the inferior vena cava and hepatic veins. Mediastinum/Nodes: -- No enlarged mediastinal, hilar or axillary lymph nodes. -- The visualized thyroid gland is unremarkable. Lungs/Pleura: Mild ground-glass opacity within the dependent lower lobes bilaterally which likely reflects edema. Small bilateral pleural effusions. No airspace consolidation. No pneumothorax. The central airways are grossly patent. Upper Abdomen: Ascites within partially imaged upper abdomen. Musculoskeletal: No acute bony abnormality. IMPRESSION: No intrathoracic mass. No findings to account for the chest x-ray appearance earlier today. Mild cardiomegaly with coronary artery disease. Reflux of contrast into the IVC and hepatic veins, a finding which may be seen in the setting of heart  failure. Mild bilateral lower lobe pulmonary edema with small bilateral pleural effusions. Ascites within the partially imaged upper abdomen. Electronically Signed   By: Kellie Simmering DO   On: 04/12/2019 21:39     Lyndee Hensen, MD 04/13/2019, 10:01 AM PGY-1, [place] Intern pager: [phone], text pages welcome

## 2019-04-13 NOTE — Progress Notes (Signed)
Pt Jeury need an CBG machine, pt does not have one presently, Thanks Buckner [place].

## 2019-04-13 NOTE — Evaluation (Signed)
Occupational Therapy Evaluation Patient Details Name: [name] MRN: [id number] DOB: [birth year] Today's Date: 04/13/2019    History of Present Illness Pt is a 68 y.o. male presenting with leg swelling, shortness of breath. PMH is significant for HFrEF, T2DM, HTN.   Clinical Impression   This 68 y/o male presents with the above. PTA pt reports independence with ADL, iADL and functional mobility. Pt performing room level mobility without AD and overall light minA progressing to minguard assist. He currently requires minA for LB ADL. Pt on RA with SpO2 >92% throughout. Pt reports he lives with his son who is able to assist intermittently PRN. Pt Denman benefit from continued acute OT services to maximize his safety and independence with ADL and mobility. Ronaldo follow.     Follow Up Recommendations  No OT follow up;Supervision - Intermittent    Equipment Recommendations  None recommended by OT           Precautions / Restrictions Precautions Precautions: Fall Restrictions Weight Bearing Restrictions: No      Mobility Bed Mobility Overal bed mobility: Needs Assistance Bed Mobility: Sit to Supine       Sit to supine: Supervision   General bed mobility comments: for safety, increased time/effort  Transfers Overall transfer level: Needs assistance Equipment used: None Transfers: Sit to/from Stand Sit to Stand: Min assist         General transfer comment: increased time/effort, assist for steadying initially    Balance Overall balance assessment: Mild deficits observed, not formally tested                                         ADL either performed or assessed with clinical judgement   ADL Overall ADL's : Needs assistance/impaired Eating/Feeding: Independent;Sitting   Grooming: Min guard;Standing   Upper Body Bathing: Set up;Sitting   Lower Body Bathing: Minimal assistance;Sit to/from stand   Upper Body Dressing : Set up;Sitting   Lower  Body Dressing: Minimal assistance;Sit to/from stand   Toilet Transfer: Minimal assistance;Min guard;Ambulation Toilet Transfer Details (indicate cue type and reason): simulated via transfer to/from recliner to EOB, room level mobility Toileting- Clothing Manipulation and Hygiene: Min guard;Sit to/from stand       Functional mobility during ADLs: Min guard;Minimal assistance General ADL Comments: mildly unsteady but no over LOB with mobility during session, pt with continued swelling in bil LEs but reports improvements in pain, O2 >92% on RA throughout     Vision         Perception     Praxis      Pertinent Vitals/Pain Pain Assessment: No/denies pain     Hand Dominance     Extremity/Trunk Assessment Upper Extremity Assessment Upper Extremity Assessment: Overall WFL for tasks assessed   Lower Extremity Assessment Lower Extremity Assessment: Defer to PT evaluation       Communication Communication Communication: No difficulties   Cognition Arousal/Alertness: Awake/alert Behavior During Therapy: WFL for tasks assessed/performed Overall Cognitive Status: Within Functional Limits for tasks assessed                                     General Comments       Exercises     Shoulder Instructions      Home Living Family/patient expects to be discharged to:: Private residence Living Arrangements:  Children(son) Available Help at Discharge: Family;Available PRN/intermittently Type of Home: House Home Access: Stairs to enter [employer] of Steps: 4 Entrance Stairs-Rails: Right;Left Home Layout: One level     Bathroom Shower/Tub: Teacher, early years/pre: Standard     Home Equipment: None          Prior Functioning/Environment Level of Independence: Independent        Comments: driving        OT Problem List: Decreased activity tolerance;Impaired balance (sitting and/or standing);Cardiopulmonary status limiting  activity;Increased edema;Decreased knowledge of use of DME or AE      OT Treatment/Interventions: Self-care/ADL training;Therapeutic exercise;Neuromuscular education;Energy conservation;DME and/or AE instruction;Therapeutic activities;Patient/family education;Balance training    OT Goals(Current goals can be found in the care plan section) Acute Rehab OT Goals Patient Stated Goal: home when able OT Goal Formulation: With patient Time For Goal Achievement: 04/27/19 Potential to Achieve Goals: Good  OT Frequency: Min 2X/week   Barriers to D/C:            Co-evaluation              AM-PAC OT "6 Clicks" Daily Activity     Outcome Measure Help from another person eating meals?: None Help from another person taking care of personal grooming?: A Little Help from another person toileting, which includes using toliet, bedpan, or urinal?: A Little Help from another person bathing (including washing, rinsing, drying)?: A Little Help from another person to put on and taking off regular upper body clothing?: None Help from another person to put on and taking off regular lower body clothing?: A Little 6 Click Score: 20   End of Session Equipment Utilized During Treatment: Gait belt Nurse Communication: Mobility status  Activity Tolerance: Patient tolerated treatment well Patient left: in bed;with call bell/phone within reach  OT Visit Diagnosis: Other abnormalities of gait and mobility (R26.89)                Time: [id number] OT Time Calculation (min): 19 min Charges:  OT General Charges $OT Visit: 1 Visit OT Evaluation $OT Eval Moderate Complexity: 1 Mod  Lou Cal, OT [employer] Pager [phone] Office [phone]   Raymondo Band 04/13/2019, 10:22 AM

## 2019-04-14 DIAGNOSIS — R600 Localized edema: Secondary | ICD-10-CM

## 2019-04-14 DIAGNOSIS — I4729 Other ventricular tachycardia: Secondary | ICD-10-CM

## 2019-04-14 DIAGNOSIS — E871 Hypo-osmolality and hyponatremia: Secondary | ICD-10-CM

## 2019-04-14 DIAGNOSIS — I472 Ventricular tachycardia: Secondary | ICD-10-CM

## 2019-04-14 DIAGNOSIS — E1165 Type 2 diabetes mellitus with hyperglycemia: Secondary | ICD-10-CM

## 2019-04-14 DIAGNOSIS — N289 Disorder of kidney and ureter, unspecified: Secondary | ICD-10-CM

## 2019-04-14 DIAGNOSIS — R609 Edema, unspecified: Secondary | ICD-10-CM

## 2019-04-14 DIAGNOSIS — IMO0002 Reserved for concepts with insufficient information to code with codable children: Secondary | ICD-10-CM

## 2019-04-14 DIAGNOSIS — N1831 Chronic kidney disease, stage 3a: Secondary | ICD-10-CM

## 2019-04-14 DIAGNOSIS — I10 Essential (primary) hypertension: Secondary | ICD-10-CM

## 2019-04-14 DIAGNOSIS — I5043 Acute on chronic combined systolic (congestive) and diastolic (congestive) heart failure: Secondary | ICD-10-CM

## 2019-04-14 HISTORY — DX: Disorder of kidney and ureter, unspecified: N28.9

## 2019-04-14 HISTORY — DX: Other ventricular tachycardia: I47.29

## 2019-04-14 HISTORY — DX: Ventricular tachycardia: I47.2

## 2019-04-14 HISTORY — DX: Hypo-osmolality and hyponatremia: E87.1

## 2019-04-14 LAB — GLUCOSE, CAPILLARY
Glucose-Capillary: 115 mg/dL — ABNORMAL HIGH (ref 70–99)
Glucose-Capillary: 178 mg/dL — ABNORMAL HIGH (ref 70–99)
Glucose-Capillary: 231 mg/dL — ABNORMAL HIGH (ref 70–99)
Glucose-Capillary: 111 mg/dL — ABNORMAL HIGH (ref 70–99)

## 2019-04-14 LAB — BASIC METABOLIC PANEL
Anion gap: 12 (ref 5–15)
BUN: 24 mg/dL — ABNORMAL HIGH (ref 8–23)
CO2: 21 mmol/L — ABNORMAL LOW (ref 22–32)
Calcium: 8.2 mg/dL — ABNORMAL LOW (ref 8.9–10.3)
Chloride: 91 mmol/L — ABNORMAL LOW (ref 98–111)
Creatinine, Ser: 1.63 mg/dL — ABNORMAL HIGH (ref 0.61–1.24)
GFR calc Af Amer: 49 mL/min — ABNORMAL LOW (ref >60)
GFR calc non Af Amer: 42 mL/min — ABNORMAL LOW (ref >60)
Glucose, Bld: 117 mg/dL — ABNORMAL HIGH (ref 70–99)
Potassium: 4.3 mmol/L (ref 3.5–5.1)
Sodium: 124 mmol/L — ABNORMAL LOW (ref 135–145)

## 2019-04-14 LAB — RPR: RPR Ser Ql: NONREACTIVE

## 2019-04-14 LAB — CBC
HCT: 32.8 % — ABNORMAL LOW (ref 39.0–52.0)
Hemoglobin: 11.3 g/dL — ABNORMAL LOW (ref 13.0–17.0)
MCH: 29.1 pg (ref 26.0–34.0)
MCHC: 34.5 g/dL (ref 30.0–36.0)
MCV: 84.5 fL (ref 80.0–100.0)
Platelets: 203 10*3/uL (ref 150–400)
RBC: 3.88 MIL/uL — ABNORMAL LOW (ref 4.22–5.81)
RDW: 13.9 % (ref 11.5–15.5)
WBC: 4.7 10*3/uL (ref 4.0–10.5)
nRBC: 0 % (ref 0.0–0.2)

## 2019-04-14 LAB — MAGNESIUM: Magnesium: 1.7 mg/dL (ref 1.7–2.4)

## 2019-04-14 MED ORDER — CARVEDILOL 3.125 MG PO TABS
3.1250 mg | ORAL_TABLET | Freq: Two times a day (BID) | ORAL | Status: DC
  Administered 2019-04-14 – 2019-04-16 (×4): 3.125 mg via ORAL
  Filled 2019-04-14 (×4): qty 1

## 2019-04-14 MED ORDER — SODIUM CHLORIDE 0.9 % IV SOLN
INTRAVENOUS | Status: DC
  Administered 2019-04-15: 10 mL/h via INTRAVENOUS

## 2019-04-14 MED ORDER — MAGNESIUM SULFATE IN D5W 1-5 GM/100ML-% IV SOLN
1.0000 g | Freq: Once | INTRAVENOUS | Status: AC
  Administered 2019-04-14: 1 g via INTRAVENOUS
  Filled 2019-04-14: qty 100

## 2019-04-14 MED ORDER — LIVING BETTER WITH HEART FAILURE BOOK
Freq: Once | Status: AC
  Administered 2019-04-14: 10:00:00

## 2019-04-14 MED ORDER — TORSEMIDE 20 MG PO TABS
40.0000 mg | ORAL_TABLET | Freq: Every day | ORAL | Status: DC
  Administered 2019-04-14: 09:00:00 40 mg via ORAL
  Filled 2019-04-14: qty 2

## 2019-04-14 MED ORDER — ISOSORBIDE MONONITRATE ER 30 MG PO TB24
15.0000 mg | ORAL_TABLET | Freq: Every day | ORAL | Status: DC
  Administered 2019-04-14: 15 mg via ORAL
  Filled 2019-04-14: qty 1

## 2019-04-14 MED ORDER — HYDRALAZINE HCL 10 MG PO TABS
10.0000 mg | ORAL_TABLET | Freq: Three times a day (TID) | ORAL | Status: DC
  Administered 2019-04-14 – 2019-04-15 (×3): 10 mg via ORAL
  Filled 2019-04-14 (×3): qty 1

## 2019-04-14 MED ORDER — FUROSEMIDE 10 MG/ML IJ SOLN
40.0000 mg | Freq: Two times a day (BID) | INTRAMUSCULAR | Status: DC
  Administered 2019-04-14: 40 mg via INTRAVENOUS
  Filled 2019-04-14: qty 4

## 2019-04-14 MED ORDER — FUROSEMIDE 10 MG/ML IJ SOLN
40.0000 mg | Freq: Two times a day (BID) | INTRAMUSCULAR | Status: DC
  Administered 2019-04-14 – 2019-04-16 (×4): 40 mg via INTRAVENOUS
  Filled 2019-04-14 (×4): qty 4

## 2019-04-14 NOTE — Plan of Care (Signed)
Nutrition Education Note RD working remotely.  RD consulted for nutrition education regarding new onset CHF.  RD spoke with very delightful patient via phone this afternoon. He reports intake of heart healthy diet at home, daily supplements of fish oil and tumeric, and recalls eating lots of salads, reports that he does not cook with salt or add to food at the table. Patient has 2 sons that live with him that occasionally prepare meals and stated "all I can taste is salt when I eat his cooking" Patient stated that prior to admission he was "a swift young man" and liked to stay active. Patient reports experiencing financial hardship in March, unable to pay $30 co-pay at his doctor's office which resulted in patient inability to have blood pressure medication along with other needed prescriptions refilled. Patient reports experiencing lack of energy and lower extremity swelling soon after not taking medications.  RD discussed "Low Sodium Nutrition Therapy" handout from the Academy of [employer]etics. Reviewed patient's dietary recall. Provided examples on ways to decrease sodium intake in diet. Discouraged intake of processed foods and use of salt shaker. Encouraged fresh fruits and vegetables as well as whole grain sources of carbohydrates to maximize fiber intake. Handout has been attached to patient discharge instructions per patient request to share with his family when he returns home.   RD discussed why it is important for patient to adhere to diet recommendations, and emphasized the role of fluids, foods to avoid, and importance of weighing self daily. Teach back method used.  Expect good compliance.  Body mass index is 33.63 kg/m. Pt meets criteria for obese based on current BMI. Patient reports UBW of 180 lbs.  Current diet order is HH/CM;1200 ml, patient is consuming approximately 100% of meals at this time. Labs and medications reviewed. No further nutrition interventions warranted  at this time. RD contact information provided. If additional nutrition issues arise, please re-consult RD.   Lajuan Lines, RD, LDN Clinical Nutrition [place] Telephone [phone] After Hours/Weekend Pager: [phone]

## 2019-04-14 NOTE — TOC Initial Note (Signed)
Transition of Care [facility]) - Initial/Assessment Note    Patient Details  Name: [name] MRN: [id number] Date of Birth: 1951/01/10  [facility] CM/SW Contact:    Carles Collet, RN Phone Number: 04/14/2019, 9:46 AM  Clinical Narrative:                Patient from home w children. Reports difficulty affording medications. Patient has cardiac cath planned for Monday 11/30. CM Jolly follow for available medication coupons post cath.  Coverage [employer] Medicare PCP [name], PA-C   Expected Discharge Plan: Home w Home Health Services Barriers to Discharge: Continued Medical Work up   Patient Goals and CMS Choice        Expected Discharge Plan and Services Expected Discharge Plan: [place]                                              Prior Living Arrangements/Services                       Activities of Daily Living   ADL Screening (condition at time of admission) Patient's cognitive ability adequate to safely complete daily activities?: Yes Is the patient deaf or have difficulty hearing?: No Does the patient have difficulty seeing, even when wearing glasses/contacts?: No Does the patient have difficulty concentrating, remembering, or making decisions?: No Does the patient have difficulty dressing or bathing?: Yes Independently performs ADLs?: Yes (appropriate for developmental age) Does the patient have difficulty walking or climbing stairs?: No  Permission Sought/Granted                  Emotional Assessment              Admission diagnosis:  Hyponatremia [E87.1] Peripheral edema [R60.9] Bilateral lower extremity edema [R60.0] CHF (congestive heart failure) ([place]) [I50.9] Patient Active Problem List   Diagnosis Date Noted  . Hyponatremia 04/14/2019  . Essential hypertension 04/14/2019  . Uncontrolled diabetes mellitus ([place]) 04/14/2019  . Renal insufficiency 04/14/2019  . NSVT (nonsustained  ventricular tachycardia) ([place]) 04/14/2019  . CHF (congestive heart failure) ([place]) 04/13/2019  . CHF exacerbation ([place]) 04/12/2019  . HFrEF (heart failure with reduced ejection fraction) ([place]) 04/12/2019   PCP:  [name], PA-C Pharmacy:   [facility] [place], [address] AT [address] [place] [address] Phone: [phone] Fax: [phone]     Social Determinants of Health (SDOH) Interventions    Readmission Risk Interventions No flowsheet data found.

## 2019-04-14 NOTE — Progress Notes (Addendum)
Progress Note  Patient Name: [name] Date of Encounter: 04/14/2019  Primary Cardiologist: Dr. Claudie Leach  Subjective   Breathing feels a lot better but swelling is still present. No CP.  Very concerned about his health, many questions. He is not sure how he ended up this way because he takes a lot of supplements to stay well. Had long discussion about importance of rx medications over supplements which may not carry same safety/efficacy data, especially in the setting of HF diagnosis.  Inpatient Medications    Scheduled Meds:  aspirin  81 mg Oral Pre-Cath   calcium carbonate  800 mg of elemental calcium Oral BID WC   enoxaparin (LOVENOX) injection  40 mg Subcutaneous Q24H   insulin aspart  0-6 Units Subcutaneous TID WC   metoprolol tartrate  25 mg Oral BID   sodium chloride flush  3 mL Intravenous Q12H   torsemide  40 mg Oral Daily   Continuous Infusions:  sodium chloride     PRN Meds: sodium chloride, acetaminophen, guaiFENesin-dextromethorphan, sodium chloride flush   Vital Signs    Vitals:   04/13/19 2114 04/14/19 0213 04/14/19 0435 04/14/19 0728  BP: (!) 133/97 (!) 121/99 114/85 (!) 130/98  Pulse: 85 79 78 84  Resp: 20 20 20    Temp: 97.8 F (36.6 C) 97.7 F (36.5 C) (!) 97.3 F (36.3 C) 98.6 F (37 C)  TempSrc: Oral Oral Oral Oral  SpO2: 100% 92% 97% 99%  Weight:   103.3 kg   Height:        Intake/Output Summary (Last 24 hours) at 04/14/2019 0759 Last data filed at 04/14/2019 0435 Gross per 24 hour  Intake 640 ml  Output 1950 ml  Net -1310 ml   Last 3 Weights 04/14/2019 04/13/2019 08/02/2015  Weight (lbs) 227 lb 11.2 oz 228 lb 8 oz 200 lb  Weight (kg) 103.284 kg 103.647 kg 90.719 kg     Telemetry    NSR with occasional PVCs, brief runs NSVT - Personally Reviewed  Physical Exam   GEN: No acute distress.  HEENT: Normocephalic, atraumatic, sclera non-icteric. Neck: No JVD or bruits. Cardiac: RRR no murmurs, rubs, or gallops.   Radials/DP/PT 1+ and equal bilaterally.  Respiratory: Mild crackles/diminished BS at bases. Breathing is unlabored. GI: Soft, nontender, non-distended, BS +x 4. MS: no deformity. Extremities: No clubbing or cyanosis. 2+ stiff pitting BLE edema to the thigh. Distal pedal pulses are 2+ and equal bilaterally. Neuro:  AAOx3. Follows commands. Psych:  Responds to questions appropriately with a normal affect.  Labs    High Sensitivity Troponin:  No results for input(s): TROPONINIHS in the last 720 hours.    Cardiac EnzymesNo results for input(s): TROPONINI in the last 168 hours. No results for input(s): TROPIPOC in the last 168 hours.   Chemistry Recent Labs  Lab 04/12/19 2022  04/13/19 0817 04/13/19 1638 04/14/19 0505  NA  --    < > 122* 122* 124*  K  --    < > 4.2 4.5 4.3  CL  --    < > 90* 89* 91*  CO2  --    < > 21* 20* 21*  GLUCOSE  --    < > 208* 180* 117*  BUN  --    < > 18 22 24*  CREATININE  --    < > 1.58* 1.76* 1.63*  CALCIUM  --    < > 7.7* 8.1* 8.2*  PROT 7.1  --   --   --   --  ALBUMIN 3.3*  --  3.3*  --   --   AST 48*  --   --   --   --   ALT 42  --   --   --   --   ALKPHOS 102  --   --   --   --   BILITOT 1.0  --   --   --   --   GFRNONAA  --    < > 44* 39* 42*  GFRAA  --    < > 51* 45* 49*  ANIONGAP  --    < > 11 13 12    < > = values in this interval not displayed.     Hematology Recent Labs  Lab 04/12/19 1559 04/14/19 0505  WBC 4.6 4.7  RBC 4.10* 3.88*  HGB 12.0* 11.3*  HCT 35.1* 32.8*  MCV 85.6 84.5  MCH 29.3 29.1  MCHC 34.2 34.5  RDW 13.9 13.9  PLT 261 203    BNP Recent Labs  Lab 04/12/19 1559  BNP 2,832.0*     DDimer No results for input(s): DDIMER in the last 168 hours.   Radiology    Dg Chest 2 View  Result Date: 04/12/2019 CLINICAL DATA:  Leg swelling, shortness of breath. EXAM: CHEST - 2 VIEW COMPARISON:  Chest radiograph 06/02/2008 FINDINGS: Heart size within normal limits.  Aortic atherosclerosis. A triangular opacity  projects in the region of the medial right lung on PA radiograph. Lungs otherwise clear. No evidence of pneumothorax or pleural effusion. No acute bony abnormality. IMPRESSION: Triangular opacity projecting in the region of the medial right lung on PA radiograph, suspicious for right middle lobe collapse and/or pneumonia. Chest CT is recommended for further evaluation and to exclude a central obstructing mass. Electronically Signed   By: Kellie Simmering DO   On: 04/12/2019 16:42   Ct Chest W Contrast  Result Date: 04/12/2019 CLINICAL DATA:  Chest x-ray concerning for mass. EXAM: CT CHEST WITH CONTRAST TECHNIQUE: Multidetector CT imaging of the chest was performed during intravenous contrast administration. CONTRAST:  41mL OMNIPAQUE IOHEXOL 300 MG/ML  SOLN COMPARISON:  Chest radiograph 04/12/2019 FINDINGS: Cardiovascular: Mild cardiomegaly. Calcified coronary artery disease. No thoracic aortic aneurysm. There is reflux of contrast into the inferior vena cava and hepatic veins. Mediastinum/Nodes: -- No enlarged mediastinal, hilar or axillary lymph nodes. -- The visualized thyroid gland is unremarkable. Lungs/Pleura: Mild ground-glass opacity within the dependent lower lobes bilaterally which likely reflects edema. Small bilateral pleural effusions. No airspace consolidation. No pneumothorax. The central airways are grossly patent. Upper Abdomen: Ascites within partially imaged upper abdomen. Musculoskeletal: No acute bony abnormality. IMPRESSION: No intrathoracic mass. No findings to account for the chest x-ray appearance earlier today. Mild cardiomegaly with coronary artery disease. Reflux of contrast into the IVC and hepatic veins, a finding which may be seen in the setting of heart failure. Mild bilateral lower lobe pulmonary edema with small bilateral pleural effusions. Ascites within the partially imaged upper abdomen. Electronically Signed   By: Kellie Simmering DO   On: 04/12/2019 21:39    Cardiac Studies    2D echo 04/13/19 IMPRESSIONS   1. Left ventricular ejection fraction, by visual estimation, is 20 to 25%. The left ventricle has severely decreased function. Left ventricular septal wall thickness was mildly increased. There is mildly increased left ventricular hypertrophy.  2. Mildly dilated left ventricular internal cavity size.  3. Diffuse hypokinesis.  4. Global right ventricle has mildly reduced systolic function.The right ventricular size  is mildly enlarged. No increase in right ventricular wall thickness.  5. Left atrial size was moderately dilated.  6. Right atrial size was moderately dilated.  7. The mitral valve is normal in structure. Mild mitral valve regurgitation. No evidence of mitral stenosis.  8. The tricuspid valve is normal in structure. Tricuspid valve regurgitation moderate-severe.  9. The aortic valve is tricuspid. Aortic valve regurgitation is mild. Mild aortic valve sclerosis without stenosis. 10. The pulmonic valve was grossly normal. Pulmonic valve regurgitation is mild. 11. Moderately elevated pulmonary artery systolic pressure. 12. The inferior vena cava is dilated in size with <50% respiratory variability, suggesting right atrial pressure of 15 mmHg.  Patient Profile     68 y.o. male with chronic systolic CHF (EF [id number] by echo and 24% by nuc 03/2018 without further w/u), noncompliance with medications in setting of poor financial situation, DM, HTN, possible CKD stage III admitted with anascarca, HTN, and hyponatremia. 2D echo 04/14/19 showed EF 20-25%, mild LVH, mildly dilated LV, diffuse HK, mildly reduced RV function, moderate LAE/RAE, moderate-severe TR, mild PR, moderately elevated PASP.  Assessment & Plan    1. Acute on chronic systolic CHF and moderate pulmonary HTN - etiology of cardiomyopathy not defined - was undergoing w/u last November with nuclear stress test. Notes at that time by primary cardiologist were entertaining idea of cath + referral to  our office but does not appear this ever occurred. Patient has struggled financially which has contributed to noncompliance. Has received 3 doses of IV Lasix so far but no further doses ordered. IM is starting torsemide. There is also mention in yesterday's note about hydralazine/nitrates but I do not see these on board. Corde review medication regimen with MD - still appears quite volume overloaded. He is not on ACEI/ARB/ARNI/spiro due to h/o angioedema with ACEI and also ongoing renal insufficiency. Reviewed 2g sodium restriction, 2L fluid restriction, daily weights with patient. Tayron consult dietitian for education (lots of sodium in diet) and order CHF folder as well. Arihaan need to consolidate Lopressor at discharge to Toprol. He is presently scheduled for [facility] on Monday. Dr. Johnsie Cancel wrote orders. Risks and benefits of cardiac catheterization have been discussed with the patient.  These include bleeding, infection, kidney damage, stroke, heart attack, death.  The patient understands these risks and is willing to proceed.  2. Hypervolemic hyponatremia - follow with diuresis. Discussed with patient that dietary sodium does not equal blood level sodium.  3. Renal insufficiency - possible CKD stage III. No recent Cr on file (1.14 in 12/2013). Cr relatively stable so far with diuresis, 1.5-1.7 this admission.  4. Essential HTN - BP fluctuating between normal and mildly elevated. Follow with anticipated med adjustment.  5. NSVT/PVCs - mostly 3-6 beats but one 23 beat run. Continue BB. K normal. Mg 1.7 -> MagOx did not provide much benefit earlier this admission. [place] give Mag sulfate 1g IV today (recognizing underlying CKD) and follow-up value in AM.  6. DM - per IM.  7. Noncompliance with regimen presenting hazards to health - care management consult has been requested.  For questions or updates, please contact [place] Please consult [email or web address] for contact info under  Cardiology/STEMI.  Signed, Charlie Pitter, PA-C 04/14/2019, 7:59 AM

## 2019-04-14 NOTE — Progress Notes (Signed)
Family Medicine Teaching Service Daily Progress Note Intern Pager: [phone]  Patient name: [name] Medical record number: [id number] Date of birth: Sep 16, 1950 Age: 68 y.o. Gender: male  Primary Care Provider: [name], PA-C Consultants: Cardiology  Code Status: FULL   Pt Overview and Major Events to Date:  04/12/19: Admitted  04/13/19: cardiology consulted   Assessment and Plan:   [name] is a 68 y.o. male presenting with leg swelling, shortness of breath. PMH is significant for HFrEF, T2DM, HTN.  Anasarca  Acute on chronic HFrEF : Improving No shortness of breath on exam with bilateral lower extremity edema to the thigh.  Reports angioedema with lisinopril in the past.  Reports no change in urine output with 40 mg Lasix oral, did have good output with 40 IV.  Creatinine improved slightly. - Cardiology following, appreciate recommendations (discussion of heart cath on Monday) - daily CBC / BMP / Mg  -Torsemide 40 daily -Continue metoprolol -Potential starting hydralazine and nitrates per cardiology - PT/ OT: no follow up rec'd   Hyponatremia, hypervolemia: Improving, 124 morning of November 20 Likely due to volume overload.  On exam, patient with bilateral lower extremity edema to the thigh. -Continue to monitor -Daily BMP  AKI: Improving, creatinine 1.63 Baseline appears to be around 1.1-1.2.  -Diet with fluid restriction, daily torsemide -Daily BMP -Avoid nephrotoxic agents  HTN : Reasonably controlled, 130/98   Home medications include HCTZ which is being held.  -Monitor blood pressure On metoprolol 25 twice daily  T2DM  Controlled on sensitive sliding scale insulin  Social Concerns Patient has difficulty accessing PCP and medications due to cost, however patient likely qualifies for Medicare. -Consulted social work and case management as appropriate to help patient access appropriate care and medications for long-term management  FEN/GI:  Replete electrolytes as needed, heart healthy carb modified diet PPx: Lovenox  Disposition: Likely home pending medical clearance   Subjective:  Says he is feeling much better although he still has fluid  Objective: Temp:  [97.3 F (36.3 C)-98.6 F (37 C)] 98.6 F (37 C) (11/28 0728) Pulse Rate:  [78-102] 84 (11/28 0728) Resp:  [18-20] 20 (11/28 0435) BP: (114-151)/(85-124) 130/98 (11/28 0728) SpO2:  [92 %-100 %] 99 % (11/28 0728) Weight:  [103.3 kg] 103.3 kg (11/28 0435)  Physical Exam: General: Pleasant, able to discuss his plan, no distress Cardiovascular: Regular heart rate, no obvious murmur to my exam Respiratory: Minor crackles at base of lungs, no increased work of breathing, no cough Abdomen: Obese, nontender Extremities: Edema with 1-2+ pitting bilaterally  Laboratory: Recent Labs  Lab 04/12/19 1559 04/14/19 0505  WBC 4.6 4.7  HGB 12.0* 11.3*  HCT 35.1* 32.8*  PLT 261 203   Recent Labs  Lab 04/12/19 2022  04/13/19 0817 04/13/19 1638 04/14/19 0505  NA  --    < > 122* 122* 124*  K  --    < > 4.2 4.5 4.3  CL  --    < > 90* 89* 91*  CO2  --    < > 21* 20* 21*  BUN  --    < > 18 22 24*  CREATININE  --    < > 1.58* 1.76* 1.63*  CALCIUM  --    < > 7.7* 8.1* 8.2*  PROT 7.1  --   --   --   --   BILITOT 1.0  --   --   --   --   ALKPHOS 102  --   --   --   --  ALT 42  --   --   --   --   AST 48*  --   --   --   --   GLUCOSE  --    < > 208* 180* 117*   < > = values in this interval not displayed.      Imaging/Diagnostic Tests: No results found.   Sherene Sires, DO 04/14/2019, 7:34 AM PGY-3, [place] Intern pager: [phone], text pages welcome

## 2019-04-15 LAB — BASIC METABOLIC PANEL
Anion gap: 13 (ref 5–15)
BUN: 27 mg/dL — ABNORMAL HIGH (ref 8–23)
CO2: 27 mmol/L (ref 22–32)
Calcium: 8.9 mg/dL (ref 8.9–10.3)
Chloride: 91 mmol/L — ABNORMAL LOW (ref 98–111)
Creatinine, Ser: 1.67 mg/dL — ABNORMAL HIGH (ref 0.61–1.24)
GFR calc Af Amer: 48 mL/min — ABNORMAL LOW (ref >60)
GFR calc non Af Amer: 41 mL/min — ABNORMAL LOW (ref >60)
Glucose, Bld: 97 mg/dL (ref 70–99)
Potassium: 3.9 mmol/L (ref 3.5–5.1)
Sodium: 131 mmol/L — ABNORMAL LOW (ref 135–145)

## 2019-04-15 LAB — CBC
HCT: 34.1 % — ABNORMAL LOW (ref 39.0–52.0)
Hemoglobin: 11.4 g/dL — ABNORMAL LOW (ref 13.0–17.0)
MCH: 28.6 pg (ref 26.0–34.0)
MCHC: 33.4 g/dL (ref 30.0–36.0)
MCV: 85.7 fL (ref 80.0–100.0)
Platelets: 248 10*3/uL (ref 150–400)
RBC: 3.98 MIL/uL — ABNORMAL LOW (ref 4.22–5.81)
RDW: 14.1 % (ref 11.5–15.5)
WBC: 4.9 10*3/uL (ref 4.0–10.5)
nRBC: 0 % (ref 0.0–0.2)

## 2019-04-15 LAB — LIPID PANEL
Cholesterol: 116 mg/dL (ref 0–200)
HDL: 43 mg/dL (ref >40)
LDL Cholesterol: 62 mg/dL (ref 0–99)
Total CHOL/HDL Ratio: 2.7 RATIO
Triglycerides: 53 mg/dL (ref <150)
VLDL: 11 mg/dL (ref 0–40)

## 2019-04-15 LAB — HEPATIC FUNCTION PANEL
ALT: 43 U/L (ref 0–44)
AST: 39 U/L (ref 15–41)
Albumin: 2.9 g/dL — ABNORMAL LOW (ref 3.5–5.0)
Alkaline Phosphatase: 130 U/L — ABNORMAL HIGH (ref 38–126)
Bilirubin, Direct: 0.1 mg/dL (ref 0.0–0.2)
Indirect Bilirubin: 0.6 mg/dL (ref 0.3–0.9)
Total Bilirubin: 0.7 mg/dL (ref 0.3–1.2)
Total Protein: 5.9 g/dL — ABNORMAL LOW (ref 6.5–8.1)

## 2019-04-15 LAB — GLUCOSE, CAPILLARY
Glucose-Capillary: 104 mg/dL — ABNORMAL HIGH (ref 70–99)
Glucose-Capillary: 146 mg/dL — ABNORMAL HIGH (ref 70–99)
Glucose-Capillary: 277 mg/dL — ABNORMAL HIGH (ref 70–99)
Glucose-Capillary: 164 mg/dL — ABNORMAL HIGH (ref 70–99)

## 2019-04-15 LAB — MAGNESIUM: Magnesium: 1.8 mg/dL (ref 1.7–2.4)

## 2019-04-15 MED ORDER — ISOSORBIDE MONONITRATE ER 30 MG PO TB24
30.0000 mg | ORAL_TABLET | Freq: Every day | ORAL | Status: DC
  Administered 2019-04-15 – 2019-04-18 (×4): 30 mg via ORAL
  Filled 2019-04-15 (×4): qty 1

## 2019-04-15 MED ORDER — MAGNESIUM SULFATE IN D5W 1-5 GM/100ML-% IV SOLN
1.0000 g | Freq: Once | INTRAVENOUS | Status: AC
  Administered 2019-04-15: 1 g via INTRAVENOUS
  Filled 2019-04-15: qty 100

## 2019-04-15 MED ORDER — HYDRALAZINE HCL 25 MG PO TABS
25.0000 mg | ORAL_TABLET | Freq: Three times a day (TID) | ORAL | Status: DC
  Administered 2019-04-15 – 2019-04-18 (×10): 25 mg via ORAL
  Filled 2019-04-15 (×10): qty 1

## 2019-04-15 MED ORDER — SPIRONOLACTONE 12.5 MG HALF TABLET
12.5000 mg | ORAL_TABLET | Freq: Every day | ORAL | Status: DC
  Administered 2019-04-15 – 2019-04-18 (×4): 12.5 mg via ORAL
  Filled 2019-04-15 (×4): qty 1

## 2019-04-15 NOTE — Discharge Summary (Addendum)
[facility] Discharge Summary  Patient name: [name] Medical record number: [id number] Date of birth: 01-13-1951 Age: 68 y.o. Gender: male Date of Admission: 04/12/2019  Date of Discharge: 04/18/2019 Admitting Physician: Gladys Damme, MD  Primary Care Provider: [name], PA-C Consultants: Cardiology  Indication for Hospitalization:  Shortness of breath 2/2 Acute on chronic HFrEF  Discharge Diagnoses/Problem List:  Patient Active Problem List   Diagnosis Date Noted  . Hyponatremia 04/14/2019  . Essential hypertension 04/14/2019  . Uncontrolled diabetes mellitus ([place]) 04/14/2019  . Renal insufficiency 04/14/2019  . NSVT (nonsustained ventricular tachycardia) ([place]) 04/14/2019  . CHF (congestive heart failure) ([place]) 04/13/2019  . CHF exacerbation ([place]) 04/12/2019  . HFrEF (heart failure with reduced ejection fraction) ([place]) 04/12/2019    Disposition: Home  Discharge Condition: Stable  Discharge Exam:   General: Appears well, no acute distress. Age appropriate.  Sitting on side of bed. Cardiac: RRR, normal heart sounds, no murmurs Respiratory: CTAB, normal effort Abdomen: soft, nontender, mildly distended  Extremities: Pitting edema BLE w/o cyanosis. Skin: Warm and dry, shin stasis rashes noted Neuro: alert and oriented, no focal deficits Psych: normal affect  Brief Hospital Course:   Acute on chronic HFrEF  pulmonary hypertension [name] is a 68 y.o. male presented for worsening dyspnea on exertion and bilateral lower extremity edema.  Upon arrival, patient was tachypneic, hypertensive and hypoxic. Chest x-ray reveals opacity in the right mid lobe that could be collapsed middle lobe, pneumonia, or other mass. However, on CT chest no findings to account for previous chest x-ray findings. There was no thoracic mass.  BNP on admission ~2800. ECHO this admission was 20 to 25% with severely decreased LV function, mild LVH,  mildly increased LV septal wall thickness diffuse hypokinesis.  RV mildly reduced systolic function mildly enlarged. Patient had a left and right heart cath on 04/16/19 that showed Normal coronary arteries, Non-ischemic CM EF 30-35% and Mild PAH due to high cardiac output otherwise normal hemodynamics. Patient was aggressively diuresed with Lasix with improvement of hypervolemia.  Patient was transitioned to torsemide prior to discharge and continued to diurese well on this oral medication.  Hypervolemia, hyponatremia Admission sodium 120 and normalized prior to discharge with oral intake.  Patient was diuresed with Lasix.  Patient presented with bilateral lower extremity edema to the abdomen that resolved prior to discharge.  Hypertensive Blood pressures were monitored throughout admission.  On the day of discharge blood pressure was 137/92 managed with coreg, hydralazine, imdur, and spironolactone.  AKI Creatinine on admission 1.67 and was stable and down trending at 1.57 at discharge.   Issues for Follow Up:  1. Needs weight scale, DME BP machine 2. New medications: Carvedilol 6.25mg  BID (Need to be increased to 12.5mg  at PCP visit per Cardiology) , hydralazine 25mg  TID, spironolactone 12.5mg  QD , torsemide 40mg  QD, Imdur 30mg  QD 3. Recommend follow-up BMP to assess creatinine, potassium and sodium. 4. Consider MoCA 5. Patient reported 9th grade reading level; needs help filling out forms 6. Follow-up diabetes with PCP.  Can consider starting an SGLT2 pending PCP recommendations 7. Patient would like to follow-up with [facility] for PCP care.  PCP can be Dr. Janus Molder  Significant Procedures:  Cardiac catheterization Assessment: 1. Normal coronary arteries 2. 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 Mild PAH due to high cardiac output otherwise normal hemdynamics  Significant Labs and Imaging:  Recent Labs  Lab 04/16/19 0500  04/16/19 0817 04/17/19 0537 04/18/19 0408  WBC 5.5  --   --  5.0 4.7   HGB 11.4*   < > 10.2* 10.7* 11.5*  HCT 33.8*   < > 30.0* 31.4* 35.4*  PLT 230  --   --  217 243   < > = values in this interval not displayed.   Recent Labs  Lab 04/12/19 2022  04/13/19 0817  04/14/19 0505 04/15/19 0435 04/16/19 0500  04/16/19 0810 04/16/19 0811 04/16/19 0817 04/17/19 0537 04/18/19 0408  NA  --    < > 122*   < > 124* 131* 133*   < > 136 133* 138 134* 136  K  --    < > 4.2   < > 4.3 3.9 4.0   < > 2.2* 3.1* 2.6* 3.6 4.3  CL  --    < > 90*   < > 91* 91* 94*  --   --   --   --  92* 92*  CO2  --    < > 21*   < > 21* 27 27  --   --   --   --  28 32  GLUCOSE  --    < > 208*   < > 117* 97 160*  --   --   --   --  141* 152*  BUN  --    < > 18   < > 24* 27* 26*  --   --   --   --  20 23  CREATININE  --    < > 1.58*   < > 1.63* 1.67* 1.57*  --   --   --   --  1.36* 1.57*  CALCIUM  --    < > 7.7*   < > 8.2* 8.9 9.0  --   --   --   --  8.8* 9.3  MG  --    < >  --   --  1.7 1.8 1.7  --   --   --   --  1.7 1.7  ALKPHOS 102  --   --   --   --  130*  --   --   --   --   --   --   --   AST 48*  --   --   --   --  39  --   --   --   --   --   --   --   ALT 42  --   --   --   --  43  --   --   --   --   --   --   --   ALBUMIN 3.3*  --  3.3*  --   --  2.9*  --   --   --   --   --   --   --    < > = values in this interval not displayed.    Dg Chest 2 View  Result Date: 04/12/2019 CLINICAL DATA:  Leg swelling, shortness of breath. EXAM: CHEST - 2 VIEW COMPARISON:  Chest radiograph 06/02/2008 FINDINGS: Heart size within normal limits.  Aortic atherosclerosis. A triangular opacity projects in the region of the medial right lung on PA radiograph. Lungs otherwise clear. No evidence of pneumothorax or pleural effusion. No acute bony abnormality. IMPRESSION: Triangular opacity projecting in the region of the medial right lung on PA radiograph, suspicious for right middle lobe collapse and/or pneumonia. Chest CT is recommended for further evaluation and to  exclude a central obstructing  mass. Electronically Signed   By: Kellie Simmering DO   On: 04/12/2019 16:42   Ct Chest W Contrast  Result Date: 04/12/2019 CLINICAL DATA:  Chest x-ray concerning for mass. EXAM: CT CHEST WITH CONTRAST TECHNIQUE: Multidetector CT imaging of the chest was performed during intravenous contrast administration. CONTRAST:  53mL OMNIPAQUE IOHEXOL 300 MG/ML  SOLN COMPARISON:  Chest radiograph 04/12/2019 FINDINGS: Cardiovascular: Mild cardiomegaly. Calcified coronary artery disease. No thoracic aortic aneurysm. There is reflux of contrast into the inferior vena cava and hepatic veins. Mediastinum/Nodes: -- No enlarged mediastinal, hilar or axillary lymph nodes. -- The visualized thyroid gland is unremarkable. Lungs/Pleura: Mild ground-glass opacity within the dependent lower lobes bilaterally which likely reflects edema. Small bilateral pleural effusions. No airspace consolidation. No pneumothorax. The central airways are grossly patent. Upper Abdomen: Ascites within partially imaged upper abdomen. Musculoskeletal: No acute bony abnormality. IMPRESSION: No intrathoracic mass. No findings to account for the chest x-ray appearance earlier today. Mild cardiomegaly with coronary artery disease. Reflux of contrast into the IVC and hepatic veins, a finding which may be seen in the setting of heart failure. Mild bilateral lower lobe pulmonary edema with small bilateral pleural effusions. Ascites within the partially imaged upper abdomen. Electronically Signed   By: Kellie Simmering DO   On: 04/12/2019 21:39    Results/Tests Pending at Time of Discharge: N/A  Discharge Medications:  Allergies as of 04/18/2019      Reactions   Lisinopril Swelling   Lip swelling   Other Other (See Comments)   Seasonal allergies      Medication List    STOP taking these medications   acetaminophen 500 MG tablet Commonly known as: TYLENOL   cyclobenzaprine 10 MG tablet Commonly known as: FLEXERIL   hydrochlorothiazide 12.5 MG  tablet Commonly known as: HYDRODIURIL   hydrochlorothiazide 25 MG tablet Commonly known as: HYDRODIURIL   VITAMIN A PO   vitamin E 1000 UNIT capsule     TAKE these medications   Alfalfa 500 MG Tabs Take 1,000 mg by mouth daily.   ASHWAGANDHA PO Take 1 tablet by mouth daily.   b complex vitamins tablet Take 1 tablet by mouth daily.   Biotin 1000 MCG tablet Take 1,000 mcg by mouth daily.   carvedilol 6.25 MG tablet Commonly known as: COREG Take 1 tablet (6.25 mg total) by mouth 2 (two) times daily with a meal.   Cinnamon 500 MG capsule Take 1,000 mg by mouth daily.   DHA PO Take 1 capsule by mouth daily.   Ginkgo Biloba 40 MG Tabs Take 40 mg by mouth at bedtime.   hydrALAZINE 25 MG tablet Commonly known as: APRESOLINE Take 1 tablet (25 mg total) by mouth every 8 (eight) hours.   isosorbide mononitrate 30 MG 24 hr tablet Commonly known as: IMDUR Take 1 tablet (30 mg total) by mouth daily. Start taking on: April 19, 2019   metFORMIN 1000 MG tablet Commonly known as: GLUCOPHAGE Take 0.5 tablets (500 mg total) by mouth 2 (two) times daily. What changed: how much to take   spironolactone 25 MG tablet Commonly known as: ALDACTONE Take 0.5 tablets (12.5 mg total) by mouth daily. Start taking on: April 19, 2019   torsemide 20 MG tablet Commonly known as: DEMADEX Take 2 tablets (40 mg total) by mouth daily. Start taking on: April 19, 2019   TURMERIC PO Take 1 capsule by mouth daily.   VISINE OP Place 1 drop into both eyes daily  as needed (dry eyes).   VITAMIN C PO Take 1 tablet by mouth daily.   VITAMIN D PO Take 1 capsule by mouth daily.   Zinc 50 MG Tabs Take 50 mg by mouth daily.       Discharge Instructions: Please refer to Patient Instructions section of EMR for full details.  Patient was counseled important signs and symptoms that should prompt return to medical care, changes in medications, dietary instructions, activity restrictions,  and follow up appointments.   Follow-Up Appointments:   Future Appointments  Date Time Provider [place]  04/20/2019  1:30 PM ACCESS TO CARE POOL [facility]  04/25/2019  1:45 PM [facility]  04/26/2019  3:00 PM Leanor Kail, PA [facility]     Gerlene Fee, DO 04/18/2019, 4:23 PM PGY-1, [place] Upper-Level Resident Addendum   I have independently interviewed and examined the patient. I have discussed the above with the original author and agree with their documentation. My edits for correction/addition/clarification are above. Please see also any attending notes.   Caroline More, DO PGY-3, [facility] 04/18/2019 6:24 PM  FPTS Service pager: [phone] (text pages welcome through [place])

## 2019-04-15 NOTE — Progress Notes (Signed)
Family Medicine Teaching Service Daily Progress Note Intern Pager: [phone]  Patient name: [name] Medical record number: [id number] Date of birth: 1950/07/20 Age: 68 y.o. Gender: male  Primary Care Provider: [name], PA-C Consultants: Cardiology  Code Status: FULL   Pt Overview and Major Events to Date:  04/12/19: Admitted  04/13/19: cardiology consulted   Assessment and Plan:   [name] is a 68 y.o. male presenting with leg swelling, shortness of breath. PMH is significant for HFrEF, T2DM, HTN.  Anasarca  Acute on chronic HFrEF : Improving, down 7L this admission No shortness of breath on exam with bilateral lower extremity edema to the thigh.  Reports no change in urine output with 40 mg Lasix oral, did have good output with 40 IV.   - Cardiology following, appreciate recommendations (discussion of heart cath on Monday) - daily CBC / BMP / Mg  -continue lasix IV per cards -Continue metoprolol -continue hydralazine per cards -likelty start spiro s/p cath on monday - PT/ OT: no follow up rec'd   Hyponatremia, hypervolemia: Improving, morning labs November 29 still pending Likely due to volume overload.  On exam, patient with bilateral lower extremity edema to the thigh. -Continue to monitor -Daily BMP  AKI: Improving, creatinine 1.63, morning labs November 29 still pending Baseline appears to be around 1.1-1.2.  -Diet with fluid restriction, daily torsemide -Daily BMP -Avoid nephrotoxic agents  HTN : controlled, 128/93   Home medications include HCTZ which is being held.  -Monitor blood pressure -continue metoprolol/hydralazine -[name] add spiro s/p cath monday  T2DM  Controlled on sensitive sliding scale insulin  Social Concerns Patient has difficulty accessing PCP and medications due to cost, however patient likely qualifies for Medicare. -Consulted social work and case management as appropriate to help patient access appropriate care and  medications for long-term management  FEN/GI: Replete electrolytes as needed, heart healthy carb modified diet PPx: Lovenox  Disposition: Likely home pending medical clearance after heart cath Subjective:  Patient is now optimistic about cath particular given his improvement in his legs with diuresis.  Objective: Temp:  [97.3 F (36.3 C)-98.6 F (37 C)] 97.8 F (36.6 C) (11/29 0332) Pulse Rate:  [78-86] 85 (11/29 0332) Resp:  [18-20] 18 (11/29 0332) BP: (114-142)/(85-98) 127/93 (11/29 0332) SpO2:  [97 %-100 %] 100 % (11/29 0332) Weight:  [99.5 kg-103.3 kg] 99.5 kg (11/29 0332)  Physical Exam: General: More cheerful, alert and in no distress Cardiovascular: Regular rate no murmurs Respiratory: Trace crackles at lungs, improved edema Abdomen: Obese, nontender Extremities: 1-2+ edema with less tension than prior day  Laboratory: Recent Labs  Lab 04/12/19 1559 04/14/19 0505  WBC 4.6 4.7  HGB 12.0* 11.3*  HCT 35.1* 32.8*  PLT 261 203   Recent Labs  Lab 04/12/19 2022  04/13/19 0817 04/13/19 1638 04/14/19 0505  NA  --    < > 122* 122* 124*  K  --    < > 4.2 4.5 4.3  CL  --    < > 90* 89* 91*  CO2  --    < > 21* 20* 21*  BUN  --    < > 18 22 24*  CREATININE  --    < > 1.58* 1.76* 1.63*  CALCIUM  --    < > 7.7* 8.1* 8.2*  PROT 7.1  --   --   --   --   BILITOT 1.0  --   --   --   --   ALKPHOS 102  --   --   --   --  ALT 42  --   --   --   --   AST 48*  --   --   --   --   GLUCOSE  --    < > 208* 180* 117*   < > = values in this interval not displayed.      Imaging/Diagnostic Tests: No results found.   Sherene Sires, DO 04/15/2019, 4:11 AM PGY-3, [place] Intern pager: [phone], text pages welcome

## 2019-04-15 NOTE — Progress Notes (Addendum)
Progress Note  Patient Name: [name] Date of Encounter: 04/15/2019  Primary Cardiologist: Dr. Claudie Leach  Subjective   SOB continues to improve with diuresis.  Denies any CP.  NSR with PVCs and 7 beat run of NSVT  Inpatient Medications    Scheduled Meds: . calcium carbonate  800 mg of elemental calcium Oral BID WC  . carvedilol  3.125 mg Oral BID WC  . enoxaparin (LOVENOX) injection  40 mg Subcutaneous Q24H  . furosemide  40 mg Intravenous Q12H  . hydrALAZINE  10 mg Oral Q8H  . insulin aspart  0-6 Units Subcutaneous TID WC  . isosorbide mononitrate  15 mg Oral Daily  . sodium chloride flush  3 mL Intravenous Q12H   Continuous Infusions: . sodium chloride    . sodium chloride     PRN Meds: sodium chloride, acetaminophen, guaiFENesin-dextromethorphan, sodium chloride flush   Vital Signs    Vitals:   04/14/19 1930 04/14/19 2358 04/15/19 0028 04/15/19 0332  BP: (!) 126/93 (!) 142/97 (!) 139/95 (!) 127/93  Pulse: 86 83 83 85  Resp: 18 18  18   Temp: 97.6 F (36.4 C) 97.7 F (36.5 C)  97.8 F (36.6 C)  TempSrc: Oral Oral  Oral  SpO2: 99% 100%  100%  Weight:    99.5 kg  Height:        Intake/Output Summary (Last 24 hours) at 04/15/2019 0734 Last data filed at 04/15/2019 0700 Gross per 24 hour  Intake 920 ml  Output 5600 ml  Net -4680 ml   Last 3 Weights 04/15/2019 04/14/2019 04/13/2019  Weight (lbs) 219 lb 4.8 oz 227 lb 11.2 oz 228 lb 8 oz  Weight (kg) 99.474 kg 103.284 kg 103.647 kg     Telemetry    NSR with PVCs and 7 beat run of NSVT - Personally Reviewed  Physical Exam   GEN: Well nourished, well developed in no acute distress HEENT: Normal NECK: No JVD; No carotid bruits LYMPHATICS: No lymphadenopathy CARDIAC:RRR, no murmurs, rubs, gallops RESPIRATORY:  Clear to auscultation without rales, wheezing or rhonchi  ABDOMEN: Soft, non-tender, non-distended MUSCULOSKELETAL:  1-2+ LE edema; No deformity  SKIN: Warm and dry NEUROLOGIC:  Alert and  oriented x 3 PSYCHIATRIC:  Normal affect    Labs    High Sensitivity Troponin:  No results for input(s): TROPONINIHS in the last 720 hours.    Cardiac EnzymesNo results for input(s): TROPONINI in the last 168 hours. No results for input(s): TROPIPOC in the last 168 hours.   Chemistry Recent Labs  Lab 04/12/19 2022  04/13/19 0817 04/13/19 1638 04/14/19 0505 04/15/19 0435  NA  --    < > 122* 122* 124* 131*  K  --    < > 4.2 4.5 4.3 3.9  CL  --    < > 90* 89* 91* 91*  CO2  --    < > 21* 20* 21* 27  GLUCOSE  --    < > 208* 180* 117* 97  BUN  --    < > 18 22 24* 27*  CREATININE  --    < > 1.58* 1.76* 1.63* 1.67*  CALCIUM  --    < > 7.7* 8.1* 8.2* 8.9  PROT 7.1  --   --   --   --  5.9*  ALBUMIN 3.3*  --  3.3*  --   --  2.9*  AST 48*  --   --   --   --  39  ALT  42  --   --   --   --  43  ALKPHOS 102  --   --   --   --  130*  BILITOT 1.0  --   --   --   --  0.7  GFRNONAA  --    < > 44* 39* 42* 41*  GFRAA  --    < > 51* 45* 49* 48*  ANIONGAP  --    < > 11 13 12 13    < > = values in this interval not displayed.     Hematology Recent Labs  Lab 04/12/19 1559 04/14/19 0505 04/15/19 0435  WBC 4.6 4.7 4.9  RBC 4.10* 3.88* 3.98*  HGB 12.0* 11.3* 11.4*  HCT 35.1* 32.8* 34.1*  MCV 85.6 84.5 85.7  MCH 29.3 29.1 28.6  MCHC 34.2 34.5 33.4  RDW 13.9 13.9 14.1  PLT 261 203 248    BNP Recent Labs  Lab 04/12/19 1559  BNP 2,832.0*     DDimer No results for input(s): DDIMER in the last 168 hours.   Radiology    No results found.  Cardiac Studies   2D echo 04/13/19 IMPRESSIONS   1. Left ventricular ejection fraction, by visual estimation, is 20 to 25%. The left ventricle has severely decreased function. Left ventricular septal wall thickness was mildly increased. There is mildly increased left ventricular hypertrophy.  2. Mildly dilated left ventricular internal cavity size.  3. Diffuse hypokinesis.  4. Global right ventricle has mildly reduced systolic function.The  right ventricular size is mildly enlarged. No increase in right ventricular wall thickness.  5. Left atrial size was moderately dilated.  6. Right atrial size was moderately dilated.  7. The mitral valve is normal in structure. Mild mitral valve regurgitation. No evidence of mitral stenosis.  8. The tricuspid valve is normal in structure. Tricuspid valve regurgitation moderate-severe.  9. The aortic valve is tricuspid. Aortic valve regurgitation is mild. Mild aortic valve sclerosis without stenosis. 10. The pulmonic valve was grossly normal. Pulmonic valve regurgitation is mild. 11. Moderately elevated pulmonary artery systolic pressure. 12. The inferior vena cava is dilated in size with <50% respiratory variability, suggesting right atrial pressure of 15 mmHg.  Patient Profile     68 y.o. male with chronic systolic CHF (EF [id number] by echo and 24% by nuc 03/2018 without further w/u), noncompliance with medications in setting of poor financial situation, DM, HTN, possible CKD stage III admitted with anascarca, HTN, and hyponatremia. 2D echo 04/14/19 showed EF 20-25%, mild LVH, mildly dilated LV, diffuse HK, mildly reduced RV function, moderate LAE/RAE, moderate-severe TR, mild PR, moderately elevated PASP.  Assessment & Plan    1. Acute on chronic systolic CHF and moderate pulmonary HTN  - etiology of cardiomyopathy not defined  - was undergoing w/u last November with nuclear stress test.  - Notes at that time by primary cardiologist were entertaining idea of cath + referral to our office but does not appear this ever occurred.  - Patient has struggled financially which has contributed to noncompliance.  - no on IV lasix 40mg  BID - he put out 5.6L yesterday and is net neg 7.4L - weight down 10lbs from admit - creatinine remains stable at 1.67 - plan for right and left heart cath tomorrow - He is not on ACEI/ARB/ARNI/spiro due to h/o angioedema with ACEI and also ongoing renal insufficiency.   - continue carvedilol 3.125mg  BID. - increase hydralazine to 25mg  TID and Imdur to 30mg  daily -  add spir 12.5mg  daily - still volume overloaded on exam so continue Lasix 40mg  IV BID - BMET in am   2. Hypervolemic hyponatremia  - significantly improved with diuresis and fluid restriction [id number]) - Discussed with patient that dietary sodium does not equal blood level sodium.  3. Renal insufficiency  - possible CKD stage III.  - No recent Cr on file (1.14 in 12/2013).  - Cr relatively stable so far with diuresis (1.58>1.76>1.63>1.67)  4. Essential HTN  -diastolic BP remains elevated - increase hydralazine to 25mg  TID and Imdur to 30mg  daily - continue carvedilol 3.125mg  BID  5. NSVT/PVCs  - mostly 3-6 beats but one 23 beat run. - Continue BB.  - K normal.  - Mg 1.7 -> 1.8 after Mag Sulfate - Kendel give another 1gm Mag sulfate today - repeat mag level in am  6. DM - per IM.  7. Noncompliance with regimen presenting hazards to health  - care management consult has been requested.  I have spent a total of 35 minutes with patient reviewing notes , telemetry, EKGs, labs and examining patient as well as establishing an assessment and plan that was discussed with the patient.  > 50% of time was spent in direct patient care.    For questions or updates, please contact [place] Please consult [email or web address] for contact info under Cardiology/STEMI.  Signed, Fransico Him, MD 04/15/2019, 7:34 AM

## 2019-04-15 NOTE — H&P (View-Only) (Signed)
Progress Note  Patient Name: [name] Date of Encounter: 04/15/2019  Primary Cardiologist: Dr. Claudie Leach  Subjective   SOB continues to improve with diuresis.  Denies any CP.  NSR with PVCs and 7 beat run of NSVT  Inpatient Medications    Scheduled Meds: . calcium carbonate  800 mg of elemental calcium Oral BID WC  . carvedilol  3.125 mg Oral BID WC  . enoxaparin (LOVENOX) injection  40 mg Subcutaneous Q24H  . furosemide  40 mg Intravenous Q12H  . hydrALAZINE  10 mg Oral Q8H  . insulin aspart  0-6 Units Subcutaneous TID WC  . isosorbide mononitrate  15 mg Oral Daily  . sodium chloride flush  3 mL Intravenous Q12H   Continuous Infusions: . sodium chloride    . sodium chloride     PRN Meds: sodium chloride, acetaminophen, guaiFENesin-dextromethorphan, sodium chloride flush   Vital Signs    Vitals:   04/14/19 1930 04/14/19 2358 04/15/19 0028 04/15/19 0332  BP: (!) 126/93 (!) 142/97 (!) 139/95 (!) 127/93  Pulse: 86 83 83 85  Resp: 18 18  18   Temp: 97.6 F (36.4 C) 97.7 F (36.5 C)  97.8 F (36.6 C)  TempSrc: Oral Oral  Oral  SpO2: 99% 100%  100%  Weight:    99.5 kg  Height:        Intake/Output Summary (Last 24 hours) at 04/15/2019 0734 Last data filed at 04/15/2019 0700 Gross per 24 hour  Intake 920 ml  Output 5600 ml  Net -4680 ml   Last 3 Weights 04/15/2019 04/14/2019 04/13/2019  Weight (lbs) 219 lb 4.8 oz 227 lb 11.2 oz 228 lb 8 oz  Weight (kg) 99.474 kg 103.284 kg 103.647 kg     Telemetry    NSR with PVCs and 7 beat run of NSVT - Personally Reviewed  Physical Exam   GEN: Well nourished, well developed in no acute distress HEENT: Normal NECK: No JVD; No carotid bruits LYMPHATICS: No lymphadenopathy CARDIAC:RRR, no murmurs, rubs, gallops RESPIRATORY:  Clear to auscultation without rales, wheezing or rhonchi  ABDOMEN: Soft, non-tender, non-distended MUSCULOSKELETAL:  1-2+ LE edema; No deformity  SKIN: Warm and dry NEUROLOGIC:  Alert and  oriented x 3 PSYCHIATRIC:  Normal affect    Labs    High Sensitivity Troponin:  No results for input(s): TROPONINIHS in the last 720 hours.    Cardiac EnzymesNo results for input(s): TROPONINI in the last 168 hours. No results for input(s): TROPIPOC in the last 168 hours.   Chemistry Recent Labs  Lab 04/12/19 2022  04/13/19 0817 04/13/19 1638 04/14/19 0505 04/15/19 0435  NA  --    < > 122* 122* 124* 131*  K  --    < > 4.2 4.5 4.3 3.9  CL  --    < > 90* 89* 91* 91*  CO2  --    < > 21* 20* 21* 27  GLUCOSE  --    < > 208* 180* 117* 97  BUN  --    < > 18 22 24* 27*  CREATININE  --    < > 1.58* 1.76* 1.63* 1.67*  CALCIUM  --    < > 7.7* 8.1* 8.2* 8.9  PROT 7.1  --   --   --   --  5.9*  ALBUMIN 3.3*  --  3.3*  --   --  2.9*  AST 48*  --   --   --   --  39  ALT  42  --   --   --   --  43  ALKPHOS 102  --   --   --   --  130*  BILITOT 1.0  --   --   --   --  0.7  GFRNONAA  --    < > 44* 39* 42* 41*  GFRAA  --    < > 51* 45* 49* 48*  ANIONGAP  --    < > 11 13 12 13    < > = values in this interval not displayed.     Hematology Recent Labs  Lab 04/12/19 1559 04/14/19 0505 04/15/19 0435  WBC 4.6 4.7 4.9  RBC 4.10* 3.88* 3.98*  HGB 12.0* 11.3* 11.4*  HCT 35.1* 32.8* 34.1*  MCV 85.6 84.5 85.7  MCH 29.3 29.1 28.6  MCHC 34.2 34.5 33.4  RDW 13.9 13.9 14.1  PLT 261 203 248    BNP Recent Labs  Lab 04/12/19 1559  BNP 2,832.0*     DDimer No results for input(s): DDIMER in the last 168 hours.   Radiology    No results found.  Cardiac Studies   2D echo 04/13/19 IMPRESSIONS   1. Left ventricular ejection fraction, by visual estimation, is 20 to 25%. The left ventricle has severely decreased function. Left ventricular septal wall thickness was mildly increased. There is mildly increased left ventricular hypertrophy.  2. Mildly dilated left ventricular internal cavity size.  3. Diffuse hypokinesis.  4. Global right ventricle has mildly reduced systolic function.The  right ventricular size is mildly enlarged. No increase in right ventricular wall thickness.  5. Left atrial size was moderately dilated.  6. Right atrial size was moderately dilated.  7. The mitral valve is normal in structure. Mild mitral valve regurgitation. No evidence of mitral stenosis.  8. The tricuspid valve is normal in structure. Tricuspid valve regurgitation moderate-severe.  9. The aortic valve is tricuspid. Aortic valve regurgitation is mild. Mild aortic valve sclerosis without stenosis. 10. The pulmonic valve was grossly normal. Pulmonic valve regurgitation is mild. 11. Moderately elevated pulmonary artery systolic pressure. 12. The inferior vena cava is dilated in size with <50% respiratory variability, suggesting right atrial pressure of 15 mmHg.  Patient Profile     68 y.o. male with chronic systolic CHF (EF [id number] by echo and 24% by nuc 03/2018 without further w/u), noncompliance with medications in setting of poor financial situation, DM, HTN, possible CKD stage III admitted with anascarca, HTN, and hyponatremia. 2D echo 04/14/19 showed EF 20-25%, mild LVH, mildly dilated LV, diffuse HK, mildly reduced RV function, moderate LAE/RAE, moderate-severe TR, mild PR, moderately elevated PASP.  Assessment & Plan    1. Acute on chronic systolic CHF and moderate pulmonary HTN  - etiology of cardiomyopathy not defined  - was undergoing w/u last November with nuclear stress test.  - Notes at that time by primary cardiologist were entertaining idea of cath + referral to our office but does not appear this ever occurred.  - Patient has struggled financially which has contributed to noncompliance.  - no on IV lasix 40mg  BID - he put out 5.6L yesterday and is net neg 7.4L - weight down 10lbs from admit - creatinine remains stable at 1.67 - plan for right and left heart cath tomorrow - He is not on ACEI/ARB/ARNI/spiro due to h/o angioedema with ACEI and also ongoing renal insufficiency.   - continue carvedilol 3.125mg  BID. - increase hydralazine to 25mg  TID and Imdur to 30mg  daily -  add spir 12.5mg  daily - still volume overloaded on exam so continue Lasix 40mg  IV BID - BMET in am   2. Hypervolemic hyponatremia  - significantly improved with diuresis and fluid restriction [id number] - Discussed with patient that dietary sodium does not equal blood level sodium.  3. Renal insufficiency  - possible CKD stage III.  - No recent Cr on file (1.14 in 12/2013).  - Cr relatively stable so far with diuresis (1.58>1.76>1.63>1.67)  4. Essential HTN  -diastolic BP remains elevated - increase hydralazine to 25mg  TID and Imdur to 30mg  daily - continue carvedilol 3.125mg  BID  5. NSVT/PVCs  - mostly 3-6 beats but one 23 beat run. - Continue BB.  - K normal.  - Mg 1.7 -> 1.8 after Mag Sulfate - Bodhi give another 1gm Mag sulfate today - repeat mag level in am  6. DM - per IM.  7. Noncompliance with regimen presenting hazards to health  - care management consult has been requested.  I have spent a total of 35 minutes with patient reviewing notes , telemetry, EKGs, labs and examining patient as well as establishing an assessment and plan that was discussed with the patient.  > 50% of time was spent in direct patient care.    For questions or updates, please contact [place] Please consult [email or web address] for contact info under Cardiology/STEMI.  Signed, Fransico Him, MD 04/15/2019, 7:34 AM

## 2019-04-15 NOTE — Progress Notes (Signed)
On call MD paged with continued elevated diastolic BP. No new orders

## 2019-04-15 NOTE — Plan of Care (Signed)
  Problem: Health Behavior/Discharge Planning: Goal: Ability to manage health-related needs [name] improve Outcome: Progressing   Problem: Clinical Measurements: Goal: Ability to maintain clinical measurements within normal limits [name] improve Outcome: Progressing Goal: [name] remain free from infection Outcome: Progressing Goal: Diagnostic test results [name] improve Outcome: Progressing Goal: Respiratory complications [name] improve Outcome: Progressing Goal: Cardiovascular complication [name] be avoided Outcome: Progressing   Problem: Activity: Goal: Risk for activity intolerance [name] decrease Outcome: Progressing   Problem: Elimination: Goal: [name] not experience complications related to bowel motility Outcome: Progressing Goal: [name] not experience complications related to urinary retention Outcome: Progressing   Problem: Pain Managment: Goal: General experience of comfort [name] improve Outcome: Progressing   Problem: Safety: Goal: Ability to remain free from injury [name] improve Outcome: Progressing   Problem: Skin Integrity: Goal: Risk for impaired skin integrity [name] decrease Outcome: Progressing   

## 2019-04-16 ENCOUNTER — Encounter (HOSPITAL_COMMUNITY)
Admission: EM | Disposition: A | Discharge status: Home / Self Care | Payer: Self-pay | Source: Home / Self Care | Attending: Family Medicine

## 2019-04-16 ENCOUNTER — Encounter (HOSPITAL_COMMUNITY): Payer: Self-pay | Admitting: Internal Medicine

## 2019-04-16 DIAGNOSIS — N289 Disorder of kidney and ureter, unspecified: Secondary | ICD-10-CM

## 2019-04-16 DIAGNOSIS — I428 Other cardiomyopathies: Secondary | ICD-10-CM

## 2019-04-16 DIAGNOSIS — I472 Ventricular tachycardia: Secondary | ICD-10-CM

## 2019-04-16 HISTORY — PX: RIGHT/LEFT HEART CATH AND CORONARY ANGIOGRAPHY: CATH118266

## 2019-04-16 LAB — BASIC METABOLIC PANEL
Anion gap: 12 (ref 5–15)
BUN: 26 mg/dL — ABNORMAL HIGH (ref 8–23)
CO2: 27 mmol/L (ref 22–32)
Calcium: 9 mg/dL (ref 8.9–10.3)
Chloride: 94 mmol/L — ABNORMAL LOW (ref 98–111)
Creatinine, Ser: 1.57 mg/dL — ABNORMAL HIGH (ref 0.61–1.24)
GFR calc Af Amer: 51 mL/min — ABNORMAL LOW (ref >60)
GFR calc non Af Amer: 44 mL/min — ABNORMAL LOW (ref >60)
Glucose, Bld: 160 mg/dL — ABNORMAL HIGH (ref 70–99)
Potassium: 4 mmol/L (ref 3.5–5.1)
Sodium: 133 mmol/L — ABNORMAL LOW (ref 135–145)

## 2019-04-16 LAB — GLUCOSE, CAPILLARY
Glucose-Capillary: 148 mg/dL — ABNORMAL HIGH (ref 70–99)
Glucose-Capillary: 152 mg/dL — ABNORMAL HIGH (ref 70–99)
Glucose-Capillary: 162 mg/dL — ABNORMAL HIGH (ref 70–99)
Glucose-Capillary: 191 mg/dL — ABNORMAL HIGH (ref 70–99)

## 2019-04-16 LAB — POCT I-STAT EG7
Acid-Base Excess: 1 mmol/L (ref 0.0–2.0)
Acid-Base Excess: 4 mmol/L — ABNORMAL HIGH (ref 0.0–2.0)
Acid-base deficit: 3 mmol/L — ABNORMAL HIGH (ref 0.0–2.0)
Bicarbonate: 22 mmol/L (ref 20.0–28.0)
Bicarbonate: 25.7 mmol/L (ref 20.0–28.0)
Bicarbonate: 29.1 mmol/L — ABNORMAL HIGH (ref 20.0–28.0)
Calcium, Ion: 0.73 mmol/L — CL (ref 1.15–1.40)
Calcium, Ion: 0.92 mmol/L — ABNORMAL LOW (ref 1.15–1.40)
Calcium, Ion: 1.17 mmol/L (ref 1.15–1.40)
HCT: 29 % — ABNORMAL LOW (ref 39.0–52.0)
HCT: 30 % — ABNORMAL LOW (ref 39.0–52.0)
HCT: 35 % — ABNORMAL LOW (ref 39.0–52.0)
Hemoglobin: 10.2 g/dL — ABNORMAL LOW (ref 13.0–17.0)
Hemoglobin: 11.9 g/dL — ABNORMAL LOW (ref 13.0–17.0)
Hemoglobin: 9.9 g/dL — ABNORMAL LOW (ref 13.0–17.0)
O2 Saturation: 70 %
O2 Saturation: 72 %
O2 Saturation: 72 %
Potassium: 2.2 mmol/L — CL (ref 3.5–5.1)
Potassium: 2.6 mmol/L — CL (ref 3.5–5.1)
Potassium: 3.1 mmol/L — ABNORMAL LOW (ref 3.5–5.1)
Sodium: 133 mmol/L — ABNORMAL LOW (ref 135–145)
Sodium: 136 mmol/L (ref 135–145)
Sodium: 138 mmol/L (ref 135–145)
TCO2: 23 mmol/L (ref 22–32)
TCO2: 27 mmol/L (ref 22–32)
TCO2: 30 mmol/L (ref 22–32)
pCO2, Ven: 38.9 mmHg — ABNORMAL LOW (ref 44.0–60.0)
pCO2, Ven: 40.5 mmHg — ABNORMAL LOW (ref 44.0–60.0)
pCO2, Ven: 43.8 mmHg — ABNORMAL LOW (ref 44.0–60.0)
pH, Ven: 7.359 (ref 7.250–7.430)
pH, Ven: 7.411 (ref 7.250–7.430)
pH, Ven: 7.431 — ABNORMAL HIGH (ref 7.250–7.430)
pO2, Ven: 37 mmHg (ref 32.0–45.0)
pO2, Ven: 38 mmHg (ref 32.0–45.0)
pO2, Ven: 38 mmHg (ref 32.0–45.0)

## 2019-04-16 LAB — CBC
HCT: 33.8 % — ABNORMAL LOW (ref 39.0–52.0)
Hemoglobin: 11.4 g/dL — ABNORMAL LOW (ref 13.0–17.0)
MCH: 29 pg (ref 26.0–34.0)
MCHC: 33.7 g/dL (ref 30.0–36.0)
MCV: 86 fL (ref 80.0–100.0)
Platelets: 230 10*3/uL (ref 150–400)
RBC: 3.93 MIL/uL — ABNORMAL LOW (ref 4.22–5.81)
RDW: 14.3 % (ref 11.5–15.5)
WBC: 5.5 10*3/uL (ref 4.0–10.5)
nRBC: 0 % (ref 0.0–0.2)

## 2019-04-16 LAB — POCT I-STAT 7, (LYTES, BLD GAS, ICA,H+H)
Acid-Base Excess: 4 mmol/L — ABNORMAL HIGH (ref 0.0–2.0)
Bicarbonate: 27.9 mmol/L (ref 20.0–28.0)
Calcium, Ion: 1.12 mmol/L — ABNORMAL LOW (ref 1.15–1.40)
HCT: 35 % — ABNORMAL LOW (ref 39.0–52.0)
Hemoglobin: 11.9 g/dL — ABNORMAL LOW (ref 13.0–17.0)
O2 Saturation: 93 %
Potassium: 3.1 mmol/L — ABNORMAL LOW (ref 3.5–5.1)
Sodium: 135 mmol/L (ref 135–145)
TCO2: 29 mmol/L (ref 22–32)
pCO2 arterial: 38.6 mmHg (ref 32.0–48.0)
pH, Arterial: 7.467 — ABNORMAL HIGH (ref 7.350–7.450)
pO2, Arterial: 63 mmHg — ABNORMAL LOW (ref 83.0–108.0)

## 2019-04-16 LAB — MAGNESIUM: Magnesium: 1.7 mg/dL (ref 1.7–2.4)

## 2019-04-16 SURGERY — RIGHT/LEFT HEART CATH AND CORONARY ANGIOGRAPHY
Anesthesia: LOCAL

## 2019-04-16 MED ORDER — LIDOCAINE HCL (PF) 1 % IJ SOLN
INTRAMUSCULAR | Status: DC | PRN
  Administered 2019-04-16 (×2): 2 mL

## 2019-04-16 MED ORDER — MIDAZOLAM HCL 2 MG/2ML IJ SOLN
INTRAMUSCULAR | Status: AC
  Filled 2019-04-16: qty 2

## 2019-04-16 MED ORDER — MIDAZOLAM HCL 2 MG/2ML IJ SOLN
INTRAMUSCULAR | Status: DC | PRN
  Administered 2019-04-16: 2 mg via INTRAVENOUS

## 2019-04-16 MED ORDER — ASPIRIN 81 MG PO CHEW
81.0000 mg | CHEWABLE_TABLET | ORAL | Status: AC
  Administered 2019-04-16: 81 mg via ORAL
  Filled 2019-04-16: qty 1

## 2019-04-16 MED ORDER — SODIUM CHLORIDE 0.9% FLUSH: 3.0000 mL | INTRAVENOUS | Status: DC | PRN

## 2019-04-16 MED ORDER — SODIUM CHLORIDE 0.9 % IV SOLN: 250.0000 mL | INTRAVENOUS | Status: DC | PRN

## 2019-04-16 MED ORDER — HEPARIN SODIUM (PORCINE) 1000 UNIT/ML IJ SOLN
INTRAMUSCULAR | Status: DC | PRN
  Administered 2019-04-16: 5000 [IU] via INTRAVENOUS

## 2019-04-16 MED ORDER — IOHEXOL 350 MG/ML SOLN
INTRAVENOUS | Status: DC | PRN
  Administered 2019-04-16: 25 mL

## 2019-04-16 MED ORDER — ENOXAPARIN SODIUM 40 MG/0.4ML ~~LOC~~ SOLN
40.0000 mg | SUBCUTANEOUS | Status: DC
  Administered 2019-04-17 – 2019-04-18 (×2): 40 mg via SUBCUTANEOUS
  Filled 2019-04-16 (×2): qty 0.4

## 2019-04-16 MED ORDER — ONDANSETRON HCL 4 MG/2ML IJ SOLN: 4.0000 mg | Freq: Four times a day (QID) | INTRAMUSCULAR | Status: DC | PRN

## 2019-04-16 MED ORDER — SODIUM CHLORIDE 0.9 % IV SOLN: INTRAVENOUS | Status: AC

## 2019-04-16 MED ORDER — TORSEMIDE 20 MG PO TABS
40.0000 mg | ORAL_TABLET | Freq: Two times a day (BID) | ORAL | Status: DC
  Administered 2019-04-17 – 2019-04-18 (×3): 40 mg via ORAL
  Filled 2019-04-16 (×3): qty 2

## 2019-04-16 MED ORDER — FENTANYL CITRATE (PF) 100 MCG/2ML IJ SOLN
INTRAMUSCULAR | Status: AC
  Filled 2019-04-16: qty 2

## 2019-04-16 MED ORDER — SODIUM CHLORIDE 0.9% FLUSH: 3.0000 mL | Freq: Two times a day (BID) | INTRAVENOUS | Status: DC

## 2019-04-16 MED ORDER — CARVEDILOL 6.25 MG PO TABS
6.2500 mg | ORAL_TABLET | Freq: Two times a day (BID) | ORAL | Status: DC
  Administered 2019-04-16 – 2019-04-18 (×4): 6.25 mg via ORAL
  Filled 2019-04-16 (×4): qty 1

## 2019-04-16 MED ORDER — MAGNESIUM SULFATE IN D5W 1-5 GM/100ML-% IV SOLN
1.0000 g | Freq: Once | INTRAVENOUS | Status: AC
  Administered 2019-04-16: 1 g via INTRAVENOUS
  Filled 2019-04-16: qty 100

## 2019-04-16 MED ORDER — HYDRALAZINE HCL 20 MG/ML IJ SOLN: 10.0000 mg | INTRAMUSCULAR | Status: AC | PRN

## 2019-04-16 MED ORDER — FUROSEMIDE 10 MG/ML IJ SOLN
40.0000 mg | Freq: Two times a day (BID) | INTRAMUSCULAR | Status: AC
  Administered 2019-04-16: 40 mg via INTRAVENOUS
  Filled 2019-04-16: qty 4

## 2019-04-16 MED ORDER — HEPARIN (PORCINE) IN NACL 1000-0.9 UT/500ML-% IV SOLN
INTRAVENOUS | Status: DC | PRN
  Administered 2019-04-16 (×2): 500 mL

## 2019-04-16 MED ORDER — HEPARIN SODIUM (PORCINE) 1000 UNIT/ML IJ SOLN
INTRAMUSCULAR | Status: AC
  Filled 2019-04-16: qty 1

## 2019-04-16 MED ORDER — LIDOCAINE HCL (PF) 1 % IJ SOLN
INTRAMUSCULAR | Status: AC
  Filled 2019-04-16: qty 30

## 2019-04-16 MED ORDER — HEPARIN (PORCINE) IN NACL 1000-0.9 UT/500ML-% IV SOLN
INTRAVENOUS | Status: AC
  Filled 2019-04-16: qty 1000

## 2019-04-16 MED ORDER — VERAPAMIL HCL 2.5 MG/ML IV SOLN
INTRAVENOUS | Status: AC
  Filled 2019-04-16: qty 2

## 2019-04-16 MED ORDER — LABETALOL HCL 5 MG/ML IV SOLN: 10.0000 mg | INTRAVENOUS | Status: AC | PRN

## 2019-04-16 MED ORDER — ACETAMINOPHEN 325 MG PO TABS
650.0000 mg | ORAL_TABLET | ORAL | Status: DC | PRN
  Administered 2019-04-17: 650 mg via ORAL
  Filled 2019-04-16: qty 2

## 2019-04-16 MED ORDER — FENTANYL CITRATE (PF) 100 MCG/2ML IJ SOLN
INTRAMUSCULAR | Status: DC | PRN
  Administered 2019-04-16: 25 ug via INTRAVENOUS

## 2019-04-16 MED ORDER — VERAPAMIL HCL 2.5 MG/ML IV SOLN
INTRAVENOUS | Status: DC | PRN
  Administered 2019-04-16: 08:00:00 10 mL via INTRA_ARTERIAL

## 2019-04-16 SURGICAL SUPPLY — 11 items
CATH 5FR JL3.5 JR4 ANG PIG MP (CATHETERS) ×2 IMPLANT
CATH BALLN WEDGE 5F 110CM (CATHETERS) ×2 IMPLANT
DEVICE RAD COMP TR BAND LRG (VASCULAR PRODUCTS) ×2 IMPLANT
GLIDESHEATH SLEND SS 6F .021 (SHEATH) ×2 IMPLANT
GUIDEWIRE INQWIRE 1.5J.035X260 (WIRE) ×1 IMPLANT
INQWIRE 1.5J .035X260CM (WIRE) ×2
KIT MICROPUNCTURE NIT STIFF (SHEATH) ×2 IMPLANT
PACK CARDIAC CATHETERIZATION (CUSTOM PROCEDURE TRAY) ×2 IMPLANT
SHEATH GLIDE SLENDER 4/5FR (SHEATH) ×2 IMPLANT
SHEATH PROBE COVER 6X72 (BAG) ×2 IMPLANT
TRANSDUCER W/STOPCOCK (MISCELLANEOUS) ×2 IMPLANT

## 2019-04-16 NOTE — TOC Transition Note (Addendum)
Transition of Care [facility]) - CM/SW Discharge Note   Patient Details  Name: [name] MRN: [id number] Date of Birth: 06/24/1950  [facility] CM/SW Contact:  Zenon Mayo, RN Phone Number: 04/16/2019, 9:22 AM   Clinical Narrative:    Patient is for discharge today, NCM spoke with him about a HHRN he states he does not need a HHRN.  NCM asked him about his medications and if he could afford them.  He was stating he goes to [facility] to get his medications and one of them was like 400.00 but he could not tell NCM which medication it was but he states he spoke with [facility] and they state they have fixed it for him.  NCM Presten check into meds, may need HF team to help if they are too expensive.  Unfortunately patient is not a HF patient so they are not able to ast him with meds.   12/2 Tomi Bamberger RN, BSN - NCM spoke with resident taking care of patient, informed her of the above information and that if patient needs information concerning his diet they Lerone need to consult the dietician, she states they [place] follow up with patient on this as outpatient and they [place] be prescribing mostly generic medications for him.     Final next level of care: Home/Self Care Barriers to Discharge: No Barriers Identified   Patient Goals and CMS Choice Patient states their goals for this hospitalization and ongoing recovery are:: get better CMS Medicare.gov Compare Post Acute Care list provided to:: Patient Choice offered to / list presented to : Patient  Discharge Placement                       Discharge Plan and Services                DME Arranged: (NA)         HH Arranged: Refused HH          Social Determinants of Health (SDOH) Interventions     Readmission Risk Interventions No flowsheet data found.

## 2019-04-16 NOTE — Progress Notes (Addendum)
Progress Note  Patient Name: [name] Date of Encounter: 04/16/2019  Primary Cardiologist: Jenkins Rouge, MD   Subjective   Cardiac cath today. Patient denies CP or SOB. Patient has been diuresing well. Patient still volume up. 23 beats NSVT overnight.  Inpatient Medications    Scheduled Meds: . [MAR Hold] calcium carbonate  800 mg of elemental calcium Oral BID WC  . [MAR Hold] carvedilol  3.125 mg Oral BID WC  . [MAR Hold] enoxaparin (LOVENOX) injection  40 mg Subcutaneous Q24H  . [MAR Hold] furosemide  40 mg Intravenous Q12H  . [MAR Hold] hydrALAZINE  25 mg Oral Q8H  . [MAR Hold] insulin aspart  0-6 Units Subcutaneous TID WC  . [MAR Hold] isosorbide mononitrate  30 mg Oral Daily  . [MAR Hold] sodium chloride flush  3 mL Intravenous Q12H  . [MAR Hold] spironolactone  12.5 mg Oral Daily   Continuous Infusions: . sodium chloride    . sodium chloride Stopped (04/16/19 0805)   PRN Meds: sodium chloride, [MAR Hold] acetaminophen, [MAR Hold] guaiFENesin-dextromethorphan, sodium chloride flush   Vital Signs    Vitals:   04/16/19 0803 04/16/19 0808 04/16/19 0812 04/16/19 0817  BP: 133/90 130/87 133/89 140/90  Pulse: 77 76 80 78  Resp: 17 16 19 20   Temp:      TempSrc:      SpO2: 99% 92% 99% 99%  Weight:      Height:        Intake/Output Summary (Last 24 hours) at 04/16/2019 0832 Last data filed at 04/16/2019 0827 Gross per 24 hour  Intake 360 ml  Output 3925 ml  Net -3565 ml   Last 3 Weights 04/16/2019 04/15/2019 04/14/2019  Weight (lbs) 212 lb 11.2 oz 219 lb 4.8 oz 227 lb 11.2 oz  Weight (kg) 96.48 kg 99.474 kg 103.284 kg      Telemetry    NSR; frequent PVCs with 23 beats VST, 3 beats NSVT - Personally Reviewed  ECG    No new - Personally Reviewed  Physical Exam   GEN: No acute distress.   Neck: No JVD Cardiac: RRR, no murmurs, rubs, or gallops.  Respiratory: Clear to auscultation bilaterally. GI: Soft, nontender, non-distended  MS: 2+ lower  extremity edema; No deformity. Neuro:  Nonfocal  Psych: Normal affect   Labs    High Sensitivity Troponin:  No results for input(s): TROPONINIHS in the last 720 hours.    Chemistry Recent Labs  Lab 04/12/19 2022  04/13/19 0817  04/14/19 0505 04/15/19 0435 04/16/19 0500  NA  --    < > 122*   < > 124* 131* 133*  K  --    < > 4.2   < > 4.3 3.9 4.0  CL  --    < > 90*   < > 91* 91* 94*  CO2  --    < > 21*   < > 21* 27 27  GLUCOSE  --    < > 208*   < > 117* 97 160*  BUN  --    < > 18   < > 24* 27* 26*  CREATININE  --    < > 1.58*   < > 1.63* 1.67* 1.57*  CALCIUM  --    < > 7.7*   < > 8.2* 8.9 9.0  PROT 7.1  --   --   --   --  5.9*  --   ALBUMIN 3.3*  --  3.3*  --   --  2.9*  --   AST 48*  --   --   --   --  39  --   ALT 42  --   --   --   --  43  --   ALKPHOS 102  --   --   --   --  130*  --   BILITOT 1.0  --   --   --   --  0.7  --   GFRNONAA  --    < > 44*   < > 42* 41* 44*  GFRAA  --    < > 51*   < > 49* 48* 51*  ANIONGAP  --    < > 11   < > 12 13 12    < > = values in this interval not displayed.     Hematology Recent Labs  Lab 04/14/19 0505 04/15/19 0435 04/16/19 0500  WBC 4.7 4.9 5.5  RBC 3.88* 3.98* 3.93*  HGB 11.3* 11.4* 11.4*  HCT 32.8* 34.1* 33.8*  MCV 84.5 85.7 86.0  MCH 29.1 28.6 29.0  MCHC 34.5 33.4 33.7  RDW 13.9 14.1 14.3  PLT 203 248 230    BNP Recent Labs  Lab 04/12/19 1559  BNP 2,832.0*     DDimer No results for input(s): DDIMER in the last 168 hours.   Radiology    No results found.  Cardiac Studies   2D echo 04/13/19 IMPRESSIONS  1. Left ventricular ejection fraction, by visual estimation, is 20 to 25%. The left ventricle has severely decreased function. Left ventricular septal wall thickness was mildly increased. There is mildly increased left ventricular hypertrophy. 2. Mildly dilated left ventricular internal cavity size. 3. Diffuse hypokinesis. 4. Global right ventricle has mildly reduced systolic function.The right  ventricular size is mildly enlarged. No increase in right ventricular wall thickness. 5. Left atrial size was moderately dilated. 6. Right atrial size was moderately dilated. 7. The mitral valve is normal in structure. Mild mitral valve regurgitation. No evidence of mitral stenosis. 8. The tricuspid valve is normal in structure. Tricuspid valve regurgitation moderate-severe. 9. The aortic valve is tricuspid. Aortic valve regurgitation is mild. Mild aortic valve sclerosis without stenosis. 10. The pulmonic valve was grossly normal. Pulmonic valve regurgitation is mild. 11. Moderately elevated pulmonary artery systolic pressure. 12. The inferior vena cava is dilated in size with <50% respiratory variability, suggesting right atrial pressure of 15 mmHg.  Cardiac Cath 04/16/19 Findings:  Ao = 110/68 (88) LV = 110/6 RA =  2 RV = 44/2 PA = 42/15 (24) PCW = 15 Fick cardiac output/index = 8.6/4.0 PVR = 0.5 WU SVR 798 Ao sat = 93% PA sat = 70%, 72% High SVC = 72%  Assessment:  1. Normal coronary arteries 2. 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 Mild PAH due to high cardiac output otherwise normal hemdynamics  Plan/Discussion:  Medical therapy. Consider referral to HF Clinic for med titration .   Patient Profile     68 y.o. male with chronic systolic CHF (EF 123XX123 by echo 24% by nuc 03/2018 without further w/u), noncompliance with medications in the setting of poor financial situation, DM, HTN, possible CKD stage III, HTN, hyponatremia. 2D echo 11/28 showed EF 20-25%. Plan for cardiac cath today  Assessment & Plan   Acute on chronic systolic CHF and moderate pulmonary HTN  Unsure etiology of cardiomyopathy. Patient was undergoing w/u last November with nuclear stress test. Notes at that time by primary cardiologist were entertaining idea of  cath + referral. Patient has struggled financially which has contributed to noncompliance.  - Cath today showed nonischemic cardiomyopathy,  mild PAH. Recommend referral to HF clinic. - Patient has been diuresing well with IV lasix 40mg  BID - Negative 3.3L yesterday and is net neg 10.9L - weight down 16lbs from admit - Patient is still volume up>> continue diuresis - creatinine stable at 1.57 - He is not on ACEI/ARB/ARNI due to h/o angioedema with ACEI and also ongoing renal insufficiency.  - continue BB>> Marquice increase - Increased hydralazine to 25mg  TID and Imdur to 30mg  daily - spir 12.5mg  daily added - Plan for advanced HF referral  Hypervolemic hyponatremia  - significantly improved with diuresis and fluid restriction (122>124>13> 133)  Renal insufficiency  - possible CKD stage III.  - No recent Cr on file (1.14 in 12/2013).  - Cr relatively stable so far with diuresis (1.58>1.76>1.63>1.67> 1.57) - continue to monitor  Essential HTN  - increase hydralazine to 25mg  TID and Imdur to 30mg  daily - continue carvedilol  - Pressures improving  NSVT/PVCs  - mostly 3-6 beats but one 23 beat run. - Continue BB>> Vinh increase to 6.25 mg BID - K normal.  - 1.7 after receiving supplemental mag sulfate>> Eulis supplement  DM  - per IM.  Noncompliance with regimen presenting hazards to health  - care management consulted   For questions or updates, please contact [place] Please consult [email or web address] for contact info under        Signed, Cadence Ninfa Meeker, PA-C  04/16/2019, 8:32 AM    ---------------------------------------------------------------------------------------------   History and all data above reviewed.  Patient examined.  I agree with the findings as above.  [name] feels well and is motivated to follow heart failure recommendations including volume restriction and dietary compliance.  Constitutional: No acute distress Eyes: pupils equally round and reactive to light, sclera non-icteric, normal conjunctiva and lids ENMT: moist mucous membranes Cardiovascular: regular rhythm,  normal rate, soft systolic murmur. S1 and S2 normal. Radial pulses normal bilaterally. Mild jugular venous distention.  Respiratory: clear to auscultation bilaterally GI : normal bowel sounds, soft and nontender. No distention.   MSK: extremities warm, well perfused. 1+ edema.  NEURO: grossly nonfocal exam, moves all extremities. PSYCH: alert and oriented x 3, normal mood and affect.   All available labs, radiology testing, previous records reviewed. Agree with documented assessment and plan of my colleague as stated above with the following additions or changes:  Active Problems:   CHF exacerbation (HCC)   HFrEF (heart failure with reduced ejection fraction) (HCC)   CHF (congestive heart failure) (HCC)   Hyponatremia   Essential hypertension   Uncontrolled diabetes mellitus (HCC)   Renal insufficiency   NSVT (nonsustained ventricular tachycardia) ([place])    Plan: agree with medication titrations as noted above. Continue diuresis. Agree that the patient may be well served by HF ambulatory referral. Consider cardiac MRI as an outpatient to further evaluate cardiomyopathy.  Length of Stay:  LOS: 5 days   Elouise Munroe, MD [facility]

## 2019-04-16 NOTE — Progress Notes (Signed)
Physical Therapy Treatment Patient Details Name: [name] MRN: [id number] DOB: [birth year] Today's Date: 04/16/2019    History of Present Illness Oluwapelumi Reineck is a 68 y.o. male presenting with leg swelling, shortness of breath. PMH is significant for CHFejection fraction of 25 to 30%, DM2, HTN.  Admitted with acute CHF, possible AKI, and HTN. s/p cardiac cath 11/30.    PT Comments    Patient progressing well towards PT goals. Continues to demonstrate impaired balance especially noted during head turns or distractions in hallway. Requires Min guard-Min A at times due to instability. No SOB noted today. Reports feeling good s/p cardiac cath and eager to return home tomorrow. Encouraged walking with nursing daily. Question safety awareness. VSS. [place] follow.    Follow Up Recommendations  No PT follow up;Supervision - Intermittent     Equipment Recommendations  None recommended by PT    Recommendations for Other Services       Precautions / Restrictions Precautions Precautions: Fall Restrictions Weight Bearing Restrictions: No    Mobility  Bed Mobility Overal bed mobility: Needs Assistance Bed Mobility: Supine to Sit     Supine to sit: Min guard;HOB elevated     General bed mobility comments: Min guard for safety due to not able to use RUE s/p cardiac cath  Transfers Overall transfer level: Needs assistance Equipment used: None Transfers: Sit to/from Stand Sit to Stand: Min guard         General transfer comment: Min guard for safety. Holding onto bed for support.  Ambulation/Gait Ambulation/Gait assistance: Min assist Gait Distance (Feet): 270 Feet Assistive device: None Gait Pattern/deviations: Step-through pattern;Drifts right/left;Staggering left;Staggering right;Wide base of support Gait velocity: decreased   General Gait Details: Slow, unsteady gait listing to right/left with a few instances of LOB requiring min A to correct mostly during head turns.  A few standing rest breaks. No SOB noted today. Not able to read Sp02 due to cold fingers.   Stairs             Wheelchair Mobility    Modified Rankin (Stroke Patients Only)       Balance Overall balance assessment: Needs assistance Sitting-balance support: No upper extremity supported;Feet supported Sitting balance-Leahy Scale: Normal     Standing balance support: During functional activity Standing balance-Leahy Scale: Fair Standing balance comment: Close Min guard-Min A for dynamic tasks.                            Cognition Arousal/Alertness: Awake/alert Behavior During Therapy: WFL for tasks assessed/performed Overall Cognitive Status: No family/caregiver present to determine baseline cognitive functioning                                 General Comments: question safety awareness; "don't hold onto me," despite pt losing balance numerous times in hallway      Exercises      General Comments General comments (skin integrity, edema, etc.): VSS. Not able to get a good Sp02 reading due to cold fingers but no SOB noted.      Pertinent Vitals/Pain Pain Assessment: No/denies pain    Home Living                      Prior Function            PT Goals (current goals can now be found in the care plan  section) Progress towards PT goals: Progressing toward goals    Frequency    Min 3X/week      PT Plan Current plan remains appropriate    Co-evaluation              AM-PAC PT "6 Clicks" Mobility   Outcome Measure  Help needed turning from your back to your side while in a flat bed without using bedrails?: None Help needed moving from lying on your back to sitting on the side of a flat bed without using bedrails?: None Help needed moving to and from a bed to a chair (including a wheelchair)?: A Little Help needed standing up from a chair using your arms (e.g., wheelchair or bedside chair)?: A Little Help needed  to walk in hospital room?: A Little Help needed climbing 3-5 steps with a railing? : A Little 6 Click Score: 20    End of Session Equipment Utilized During Treatment: Gait belt Activity Tolerance: Patient tolerated treatment well Patient left: in bed;with call bell/phone within reach Nurse Communication: Mobility status PT Visit Diagnosis: Unsteadiness on feet (R26.81);Other abnormalities of gait and mobility (R26.89)     Time: [id number] PT Time Calculation (min) (ACUTE ONLY): 23 min  Charges:  $Gait Training: 23-37 mins                     Marisa Severin, PT, DPT Acute Rehabilitation Services Pager [phone] Office [place] 04/16/2019, 3:49 PM

## 2019-04-16 NOTE — Progress Notes (Addendum)
Family Medicine Teaching Service Daily Progress Note Intern Pager: [phone]  Patient name: [name] Medical record number: [id number] Date of birth: 19-Apr-1951 Age: 68 y.o. Gender: male  Primary Care Provider: [name], PA-C Consultants: Cardiology  Code Status: FULL   Pt Overview and Major Events to Date:  04/12/19: Admitted  04/13/19: cardiology consulted   Assessment and Plan:   [name] is a 68 y.o. male presenting with leg swelling, shortness of breath. PMH is significant for HFrEF, T2DM, HTN.  Anasarca  Acute on chronic HFrEF  Moderate Pulm HTN  Denies shortness of breath, chest pain today.  Continues to have significant bilateral lower extremity edema however has improved from admission.  Right and left heart cath today showed nonischemic cardiomyopathy, mild PAH. Recommend referral to HF clinic.  Patient diuresed 3.3 L s/p Lasix 40 mg twice daily yesterday.  I/O net -10 L, 7.5 kg since admission.  Patient to have left and right heart cath today.  Creatine is down-trending.  - Cardiology following, appreciate recommendations  - daily CBC / BMP / Mg  -continue lasix IV per cards -Continue metoprolol -continue hydralazine per cards -likelty start spiro s/p cath on monday - PT/ OT: no follow up rec'd  -Consider transition to torsemide 40 mg twice daily  Hyponatremia, hypervolemia Likely due to volume overload.  Sodium 133 from 131 yesterday.  Patient still has significant edema bilateral lower extremities. -Continue to monitor -Daily BMP  AKI Improving. Baseline appears to be around 1.1-1.2.  Creatinine 1.57  today from 1.67 yesterday.  -Diet with fluid restriction, daily forsemide -Daily BMP -Avoid nephrotoxic agents  HTN  BP today 142/100. Home medications include HCTZ which is being held.  Per cardiology, carvedilol 3.  1 2 5  mg twice daily, hydralazine increased to 25 mg 3 times daily, Imdur 30 mg daily and spironolactone 12.5 mg daily.   -Monitor blood pressure -continue hydralazine, Imdur, spironolactone -Hold medications after procedure  T2DM  Controlled.  Fasting glucose 148.  Patient received 3 units sliding scale insulin.  -Continue sensitive sliding scale insulin -Monitor CBGs  Social Concerns Patient has difficulty accessing PCP and medications due to cost, however patient likely qualifies for Medicare. -Consulted social work and case management to help patient access appropriate care and medications for long-term management -Consider SLP evaluation for [facility]  FEN/GI: Replete electrolytes as needed, heart healthy carb modified diet PPx: Lovenox  Disposition: Likely home, pending medical procedure  Subjective:  [name] had no significant overnight events.     Objective: Temp:  [97.9 F (36.6 C)-99 F (37.2 C)] 98.3 F (36.8 C) (11/30 1204) Pulse Rate:  [70-94] 91 (11/30 1204) Resp:  [16-22] 16 (11/30 1204) BP: (123-158)/(82-114) 123/82 (11/30 1204) SpO2:  [92 %-100 %] 99 % (11/30 1204) Weight:  [96.5 kg] 96.5 kg (11/30 0436)  Physical Exam:  GEN: pleasant, smiling, no acute distress CV: regular rate and rhythm, no murmurs appreciated RESP: no increased work of breathing, clear to ascultation bilaterally with no crackles, wheezes, or rhonchi  ABD: Bowel sounds present. Soft, Nontender, Nondistended.  MSK:  2+/3+ bilateral lower extremity edema, or cyanosis noted NEURO: grossly normal, moves all extremities appropriately PSYCH: Normal affect and thought content     Laboratory: Recent Labs  Lab 04/14/19 0505 04/15/19 0435 04/16/19 0500  04/16/19 0810 04/16/19 0811 04/16/19 0817  WBC 4.7 4.9 5.5  --   --   --   --   HGB 11.3* 11.4* 11.4*   < > 9.9* 11.9* 10.2*  HCT 32.8* 34.1* 33.8*   < > 29.0* 35.0* 30.0*  PLT 203 248 230  --   --   --   --    < > = values in this interval not displayed.   Recent Labs  Lab 04/12/19 2022  04/14/19 0505 04/15/19 0435 04/16/19 0500   04/16/19 0810 04/16/19 0811 04/16/19 0817  NA  --    < > 124* 131* 133*   < > 136 133* 138  K  --    < > 4.3 3.9 4.0   < > 2.2* 3.1* 2.6*  CL  --    < > 91* 91* 94*  --   --   --   --   CO2  --    < > 21* 27 27  --   --   --   --   BUN  --    < > 24* 27* 26*  --   --   --   --   CREATININE  --    < > 1.63* 1.67* 1.57*  --   --   --   --   CALCIUM  --    < > 8.2* 8.9 9.0  --   --   --   --   PROT 7.1  --   --  5.9*  --   --   --   --   --   BILITOT 1.0  --   --  0.7  --   --   --   --   --   ALKPHOS 102  --   --  130*  --   --   --   --   --   ALT 42  --   --  43  --   --   --   --   --   AST 48*  --   --  39  --   --   --   --   --   GLUCOSE  --    < > 117* 97 160*  --   --   --   --    < > = values in this interval not displayed.      Imaging/Diagnostic Tests: No results found.   Lyndee Hensen, MD 04/16/2019, 12:15 PM PGY-1, [place] Intern pager: [phone], text pages welcome

## 2019-04-16 NOTE — Progress Notes (Signed)
CMT called stated that patient has 23 beats V Tach bp 133/86 HR 93 patient stated no chest pain or SOB Cardiology notified. Arthor Captain LPN

## 2019-04-16 NOTE — Plan of Care (Signed)
?  Problem: Clinical Measurements: ?Goal: Ability to maintain clinical measurements within normal limits Lenix improve ?Outcome: Progressing ?Goal: Corrado remain free from infection ?Outcome: Progressing ?Goal: Diagnostic test results Amani improve ?Outcome: Progressing ?Goal: Respiratory complications Adelfo improve ?Outcome: Progressing ?Goal: Cardiovascular complication Jae be avoided ?Outcome: Progressing ?  ?Problem: Activity: ?Goal: Risk for activity intolerance Braydin decrease ?Outcome: Progressing ?  ?Problem: Elimination: ?Goal: Carl not experience complications related to bowel motility ?Outcome: Progressing ?Goal: Kunaal not experience complications related to urinary retention ?Outcome: Progressing ?  ?Problem: Pain Managment: ?Goal: General experience of comfort Reagan improve ?Outcome: Progressing ?  ?Problem: Safety: ?Goal: Ability to remain free from injury Bernie improve ?Outcome: Progressing ?  ?Problem: Skin Integrity: ?Goal: Risk for impaired skin integrity Abass decrease ?Outcome: Progressing ?  ?

## 2019-04-16 NOTE — Interval H&P Note (Signed)
History and Physical Interval Note:  04/16/2019 7:50 AM  [name]  has presented today for surgery, with the diagnosis of heart failure.  The various methods of treatment have been discussed with the patient and family. After consideration of risks, benefits and other options for treatment, the patient has consented to  Procedure(s): RIGHT/LEFT HEART CATH AND CORONARY ANGIOGRAPHY (N/A) possible coronary angioplasty as a surgical intervention.  The patient's history has been reviewed, patient examined, no change in status, stable for surgery.  I have reviewed the patient's chart and labs.  Questions were answered to the patient's satisfaction.     Daniel Bensimhon

## 2019-04-17 DIAGNOSIS — I10 Essential (primary) hypertension: Secondary | ICD-10-CM

## 2019-04-17 LAB — GLUCOSE, CAPILLARY
Glucose-Capillary: 149 mg/dL — ABNORMAL HIGH (ref 70–99)
Glucose-Capillary: 195 mg/dL — ABNORMAL HIGH (ref 70–99)
Glucose-Capillary: 270 mg/dL — ABNORMAL HIGH (ref 70–99)
Glucose-Capillary: 125 mg/dL — ABNORMAL HIGH (ref 70–99)
Glucose-Capillary: 195 mg/dL — ABNORMAL HIGH (ref 70–99)

## 2019-04-17 LAB — CBC
HCT: 31.4 % — ABNORMAL LOW (ref 39.0–52.0)
Hemoglobin: 10.7 g/dL — ABNORMAL LOW (ref 13.0–17.0)
MCH: 29.1 pg (ref 26.0–34.0)
MCHC: 34.1 g/dL (ref 30.0–36.0)
MCV: 85.3 fL (ref 80.0–100.0)
Platelets: 217 10*3/uL (ref 150–400)
RBC: 3.68 MIL/uL — ABNORMAL LOW (ref 4.22–5.81)
RDW: 14.6 % (ref 11.5–15.5)
WBC: 5 10*3/uL (ref 4.0–10.5)
nRBC: 0 % (ref 0.0–0.2)

## 2019-04-17 LAB — BASIC METABOLIC PANEL
Anion gap: 14 (ref 5–15)
BUN: 20 mg/dL (ref 8–23)
CO2: 28 mmol/L (ref 22–32)
Calcium: 8.8 mg/dL — ABNORMAL LOW (ref 8.9–10.3)
Chloride: 92 mmol/L — ABNORMAL LOW (ref 98–111)
Creatinine, Ser: 1.36 mg/dL — ABNORMAL HIGH (ref 0.61–1.24)
GFR calc Af Amer: >60 mL/min (ref >60)
GFR calc non Af Amer: 53 mL/min — ABNORMAL LOW (ref >60)
Glucose, Bld: 141 mg/dL — ABNORMAL HIGH (ref 70–99)
Potassium: 3.6 mmol/L (ref 3.5–5.1)
Sodium: 134 mmol/L — ABNORMAL LOW (ref 135–145)

## 2019-04-17 LAB — MAGNESIUM: Magnesium: 1.7 mg/dL (ref 1.7–2.4)

## 2019-04-17 MED ORDER — POTASSIUM CHLORIDE CRYS ER 20 MEQ PO TBCR
40.0000 meq | EXTENDED_RELEASE_TABLET | Freq: Two times a day (BID) | ORAL | Status: AC
  Administered 2019-04-17 (×2): 40 meq via ORAL
  Filled 2019-04-17 (×2): qty 2

## 2019-04-17 MED ORDER — POTASSIUM CHLORIDE 10 MEQ/100ML IV SOLN
10.0000 meq | INTRAVENOUS | Status: DC
  Administered 2019-04-17: 10 meq via INTRAVENOUS
  Filled 2019-04-17: qty 100

## 2019-04-17 MED ORDER — MAGNESIUM SULFATE IN D5W 1-5 GM/100ML-% IV SOLN
1.0000 g | Freq: Once | INTRAVENOUS | Status: AC
  Administered 2019-04-17: 1 g via INTRAVENOUS
  Filled 2019-04-17: qty 100

## 2019-04-17 NOTE — Progress Notes (Signed)
Family Medicine Teaching Service Daily Progress Note Intern Pager: [phone]  Patient name: [name] Medical record number: [id number] Date of birth: [birth year] Age: 68 y.o. Gender: male  Primary Care Provider: [name], PA-C Consultants: Cardiology  Code Status: FULL   Pt Overview and Major Events to Date:  04/12/19: Admitted  04/13/19: cardiology consulted   Assessment and Plan:  [name] is a 69 y.o. male presenting with leg swelling, shortness of breath. PMH is significant for HFrEF, T2DM, HTN.  Anasarca  Acute on chronic HFrEF  Moderate Pulm HTN  Denies shortness of breath, chest pain today.  Continues to have significant bilateral lower extremity edema however has improved from admission. Cardiology has recommend referral to HF clinic.  I/O net -1.6L yesterday.  S/p cath. Creatine is down-trending.  - Cardiology following, appreciate recommendations  - daily CBC / BMP / Mg  -continue torsemide 40mg  BID for now, recheck I/Os at 2pm; consider further diuresing with IV lasix if output with oral medication inadequate -Continue metoprolol -continue hydralazine per cards -continue spironolactone  Hyponatremia, hypervolemia Likely due to volume overload.  Sodium 134 from 133 yesterday.  Patient still has significant edema bilateral lower extremities. -Continue to monitor -Daily BMP  AKI Improving. Baseline appears to be around 1.1-1.2.  Creatinine 1.36  today from 1.57 yesterday.  -Diet with fluid restriction -Daily BMP -Avoid nephrotoxic agents  HTN  BP today 127/85. Home medications include HCTZ which is being held.  Per cardiology, carvedilol 3.125 mg twice daily, hydralazine increased to 25 mg 3 times daily, Imdur 30 mg daily and spironolactone 12.5 mg daily.  -Monitor blood pressure -continue hydralazine, Imdur, spironolactone  T2DM  Controlled. CBG 149.   -Continue sensitive sliding scale insulin -Monitor CBGs -Consider starting SGLT2  inhibitor, with continuing metformin at discharge  Social Concerns Patient has difficulty accessing PCP and medications due to cost, however patient likely qualifies for Medicare. -Consulted social work and case management to help patient access appropriate care and medications for long-term management -Consider [facility] outpatient -Ask patient if he would like help with new PCP  FEN/GI: Replete electrolytes as needed, heart healthy carb modified diet PPx: Lovenox  Disposition: Likely home tomorrow   Subjective:  [name] had no significant overnight events.  He says he is feeling well and was never short of breath or chest pain.  He would like to go home tomorrow as he has bills to pay on the third.  He has been having some trouble taking his medications due to cost but has applied for assistance.  He is motivated and excited to work with OT today.  Objective: Temp:  [98.3 F (36.8 C)-99.4 F (37.4 C)] 98.8 F (37.1 C) (12/01 0433) Pulse Rate:  [76-92] 83 (12/01 0433) Resp:  [16-20] 20 (12/01 0433) BP: (117-140)/(79-90) 127/85 (12/01 0433) SpO2:  [92 %-100 %] 98 % (11/30 1937) Weight:  [94.2 kg] 94.2 kg (12/01 0433)  Physical Exam:  General: Appears well, no acute distress. Age appropriate.  Sitting up in bedside chair. Cardiac: RRR, normal heart sounds, no murmurs Respiratory: CTAB, normal effort Abdomen: soft, nontender, nondistended Extremities: Pitting edema as high as adomen. No cyanosis. Right leg protective patch due to skin sloughing likely due to venous stasis Skin: Warm and dry Neuro: alert and oriented, no focal deficits Psych: normal affect, happy   Laboratory: Recent Labs  Lab 04/15/19 0435 04/16/19 0500  04/16/19 0811 04/16/19 0817 04/17/19 0537  WBC 4.9 5.5  --   --   --  5.0  HGB 11.4* 11.4*   < > 11.9* 10.2* 10.7*  HCT 34.1* 33.8*   < > 35.0* 30.0* 31.4*  PLT 248 230  --   --   --  217   < > = values in this interval not displayed.   Recent Labs   Lab 04/12/19 2022  04/15/19 0435 04/16/19 0500  04/16/19 0811 04/16/19 0817 04/17/19 0537  NA  --    < > 131* 133*   < > 133* 138 134*  K  --    < > 3.9 4.0   < > 3.1* 2.6* 3.6  CL  --    < > 91* 94*  --   --   --  92*  CO2  --    < > 27 27  --   --   --  28  BUN  --    < > 27* 26*  --   --   --  20  CREATININE  --    < > 1.67* 1.57*  --   --   --  1.36*  CALCIUM  --    < > 8.9 9.0  --   --   --  8.8*  PROT 7.1  --  5.9*  --   --   --   --   --   BILITOT 1.0  --  0.7  --   --   --   --   --   ALKPHOS 102  --  130*  --   --   --   --   --   ALT 42  --  43  --   --   --   --   --   AST 48*  --  39  --   --   --   --   --   GLUCOSE  --    < > 97 160*  --   --   --  141*   < > = values in this interval not displayed.      Imaging/Diagnostic Tests: Cardiac catheterization Assessment: 1. Normal coronary arteries 2. 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 Mild PAH due to high cardiac output otherwise normal hemdynamics   [name], Lucas Chaloux, DO 04/17/2019, 8:03 AM PGY-1, [place] Intern pager: [phone], text pages welcome

## 2019-04-17 NOTE — Progress Notes (Signed)
Occupational Therapy Treatment Patient Details Name: [name] MRN: [id number] DOB: 1950-05-25 Today's Date: 04/17/2019    History of present illness [place] is a 68 y.o. male presenting with leg swelling, shortness of breath. PMH is significant for CHFejection fraction of 25 to 30%, DM2, HTN.  Admitted with acute CHF, possible AKI, and HTN. s/p cardiac cath 11/30.   OT comments  Patient seated in chair upon arrival, agreeable to OT. Patient independent with self care tasks, grooming/hygiene standing at sink, seated LB dressing, ambulating to/from bathroom and utilizing urinal. Patient also ambulate in hallway without adaptive device and no loss of balance noted. Patient reports feeling at his baseline, Buck discharge acute OT at this time.    Follow Up Recommendations  No OT follow up;Supervision - Intermittent    Equipment Recommendations  None recommended by OT       Precautions / Restrictions Precautions Precautions: Fall Restrictions Weight Bearing Restrictions: No       Mobility Bed Mobility               General bed mobility comments: patient in chair upon arrival   Transfers Overall transfer level: Independent Equipment used: None Transfers: Sit to/from Stand Sit to Stand: Independent              Balance Overall balance assessment: Independent Sitting-balance support: No upper extremity supported;Feet supported Sitting balance-Leahy Scale: Normal     Standing balance support: During functional activity Standing balance-Leahy Scale: Good Standing balance comment: patient does not require physical assist or adaptive device with ambulation               High Level Balance Comments: no loss of balance noted with these tasks           ADL either performed or assessed with clinical judgement   ADL Overall ADL's : Independent     Grooming: Independent;Wash/dry face;Wash/dry hands;Oral care;Applying deodorant;Standing   Upper  Body Bathing: Independent;Standing(washing under arms)           Lower Body Dressing: Independent(patient demonstrate ability to doff/don socks )   Toilet Transfer: Independent;Ambulation Toilet Transfer Details (indicate cue type and reason): simulated transfer from recliner, patient also ambulate into bathroom with urinal and managed without assist Toileting- Clothing Manipulation and Hygiene: Independent(managed clothing in order to use urinal)       Functional mobility during ADLs: Independent General ADL Comments: patient ambulate around his room and in hallway without AD, no loss of balance noted               Cognition Arousal/Alertness: Awake/alert Behavior During Therapy: WFL for tasks assessed/performed Overall Cognitive Status: Within Functional Limits for tasks assessed                                                     Pertinent Vitals/ Pain       Pain Assessment: No/denies pain            Progress Toward Goals  OT Goals(current goals can now be found in the care plan section)  Progress towards OT goals: Goals met/education completed, patient discharged from OT  Goals met  Plan All goals met and education completed, patient discharged from OT services       AM-PAC OT "6 Clicks" Daily Activity     Outcome Measure  Help from another person eating meals?: None Help from another person taking care of personal grooming?: None Help from another person toileting, which includes using toliet, bedpan, or urinal?: None Help from another person bathing (including washing, rinsing, drying)?: None Help from another person to put on and taking off regular upper body clothing?: None Help from another person to put on and taking off regular lower body clothing?: None 6 Click Score: 24    End of Session Equipment Utilized During Treatment: (none)  OT Visit Diagnosis: Other abnormalities of gait and mobility (R26.89)   Activity Tolerance  Patient tolerated treatment well   Patient Left in chair;with call bell/phone within reach   Nurse Communication Mobility status        Time: 4045-9136 OT Time Calculation (min): 27 min  Charges: OT General Charges $OT Visit: 1 Visit OT Treatments $Self Care/Home Management : 23-37 mins  Shon Millet OT OT office: [place] 04/17/2019, 10:55 AM

## 2019-04-17 NOTE — Progress Notes (Signed)
Patient had nonsustained VT. PT asymptomatic, alert and oriented. Treylen monitor

## 2019-04-17 NOTE — Care Management Important Message (Signed)
Important Message  Patient Details  Name: [name] MRN: [id number] Date of Birth: 1950-12-01   Medicare Important Message Given:  Yes     [name] 04/17/2019, 10:53 AM

## 2019-04-17 NOTE — Progress Notes (Addendum)
Progress Note  Patient Name: [name] Date of Encounter: 04/17/2019  Primary Cardiologist: Jenkins Rouge, MD   Subjective   10 Beats NSVT overnight. Denies CP or SOB. Patient diuresing well.   Inpatient Medications    Scheduled Meds: . calcium carbonate  800 mg of elemental calcium Oral BID WC  . carvedilol  6.25 mg Oral BID WC  . enoxaparin (LOVENOX) injection  40 mg Subcutaneous Q24H  . hydrALAZINE  25 mg Oral Q8H  . insulin aspart  0-6 Units Subcutaneous TID WC  . isosorbide mononitrate  30 mg Oral Daily  . spironolactone  12.5 mg Oral Daily  . torsemide  40 mg Oral BID   Continuous Infusions: . sodium chloride     PRN Meds: sodium chloride, acetaminophen, guaiFENesin-dextromethorphan, ondansetron (ZOFRAN) IV, sodium chloride flush   Vital Signs    Vitals:   04/16/19 1618 04/16/19 1937 04/17/19 0433 04/17/19 0816  BP: 129/88 117/79 127/85 (!) 148/95  Pulse: 92 91 83 95  Resp: 16 20 20 16   Temp: 99.4 F (37.4 C) 98.9 F (37.2 C) 98.8 F (37.1 C) 98.8 F (37.1 C)  TempSrc: Oral Oral Oral Oral  SpO2: 100% 98%  99%  Weight:   94.2 kg   Height:        Intake/Output Summary (Last 24 hours) at 04/17/2019 0943 Last data filed at 04/17/2019 0600 Gross per 24 hour  Intake 416.79 ml  Output 2750 ml  Net -2333.21 ml   Last 3 Weights 04/17/2019 04/16/2019 04/15/2019  Weight (lbs) 207 lb 11.2 oz 212 lb 11.2 oz 219 lb 4.8 oz  Weight (kg) 94.212 kg 96.48 kg 99.474 kg      Telemetry     NSR, HR 80s; 10 beats NSVT, episode of 2 beats NSVT, frequent PVCs- Personally Reviewed  ECG    No new - Personally Reviewed  Physical Exam   GEN: No acute distress.   Neck: No JVD Cardiac: RRR, no murmurs, rubs, or gallops.  Respiratory: Clear to auscultation bilaterally. GI: Soft, nontender, non-distended  MS: 1 + lower leg edema; No deformity. Neuro:  Nonfocal  Psych: Normal affect   Labs    High Sensitivity Troponin:  No results for input(s): TROPONINIHS in the  last 720 hours.    Chemistry Recent Labs  Lab 04/12/19 2022  04/13/19 0817  04/15/19 0435 04/16/19 0500  04/16/19 0811 04/16/19 0817 04/17/19 0537  NA  --    < > 122*   < > 131* 133*   < > 133* 138 134*  K  --    < > 4.2   < > 3.9 4.0   < > 3.1* 2.6* 3.6  CL  --    < > 90*   < > 91* 94*  --   --   --  92*  CO2  --    < > 21*   < > 27 27  --   --   --  28  GLUCOSE  --    < > 208*   < > 97 160*  --   --   --  141*  BUN  --    < > 18   < > 27* 26*  --   --   --  20  CREATININE  --    < > 1.58*   < > 1.67* 1.57*  --   --   --  1.36*  CALCIUM  --    < > 7.7*   < >  8.9 9.0  --   --   --  8.8*  PROT 7.1  --   --   --  5.9*  --   --   --   --   --   ALBUMIN 3.3*  --  3.3*  --  2.9*  --   --   --   --   --   AST 48*  --   --   --  39  --   --   --   --   --   ALT 42  --   --   --  43  --   --   --   --   --   ALKPHOS 102  --   --   --  130*  --   --   --   --   --   BILITOT 1.0  --   --   --  0.7  --   --   --   --   --   GFRNONAA  --    < > 44*   < > 41* 44*  --   --   --  53*  GFRAA  --    < > 51*   < > 48* 51*  --   --   --  >60  ANIONGAP  --    < > 11   < > 13 12  --   --   --  14   < > = values in this interval not displayed.     Hematology Recent Labs  Lab 04/15/19 0435 04/16/19 0500  04/16/19 0811 04/16/19 0817 04/17/19 0537  WBC 4.9 5.5  --   --   --  5.0  RBC 3.98* 3.93*  --   --   --  3.68*  HGB 11.4* 11.4*   < > 11.9* 10.2* 10.7*  HCT 34.1* 33.8*   < > 35.0* 30.0* 31.4*  MCV 85.7 86.0  --   --   --  85.3  MCH 28.6 29.0  --   --   --  29.1  MCHC 33.4 33.7  --   --   --  34.1  RDW 14.1 14.3  --   --   --  14.6  PLT 248 230  --   --   --  217   < > = values in this interval not displayed.    BNP Recent Labs  Lab 04/12/19 1559  BNP 2,832.0*     DDimer No results for input(s): DDIMER in the last 168 hours.   Radiology    No results found.  Cardiac Studies   2D echo 04/13/19 IMPRESSIONS  1. Left ventricular ejection fraction, by visual estimation, is  20 to 25%. The left ventricle has severely decreased function. Left ventricular septal wall thickness was mildly increased. There is mildly increased left ventricular hypertrophy. 2. Mildly dilated left ventricular internal cavity size. 3. Diffuse hypokinesis. 4. Global right ventricle has mildly reduced systolic function.The right ventricular size is mildly enlarged. No increase in right ventricular wall thickness. 5. Left atrial size was moderately dilated. 6. Right atrial size was moderately dilated. 7. The mitral valve is normal in structure. Mild mitral valve regurgitation. No evidence of mitral stenosis. 8. The tricuspid valve is normal in structure. Tricuspid valve regurgitation moderate-severe. 9. The aortic valve is tricuspid. Aortic valve regurgitation is mild. Mild aortic valve sclerosis without stenosis. 10. The pulmonic valve was grossly normal.  Pulmonic valve regurgitation is mild. 11. Moderately elevated pulmonary artery systolic pressure. 12. The inferior vena cava is dilated in size with <50% respiratory variability, suggesting right atrial pressure of 15 mmHg.  Cardiac Cath 04/16/19 Findings:  Ao = 110/68 (88) LV = 110/6 RA = 2 RV = 44/2 PA = 42/15 (24) PCW = 15 Fick cardiac output/index = 8.6/4.0 PVR = 0.5 WU SVR 798 Ao sat = 93% PA sat = 70%, 72% High SVC = 72%  Assessment:  1. Normal coronary arteries 2. 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 Mild PAH due to high cardiac output otherwise normal hemdynamics  Plan/Discussion:  Medical therapy. Consider referral to HF Clinic for med titration .   Patient Profile     68 y.o. male with chronic systolic CHF (EF [id number] by echo 24% by nuc 03/2018 without further w/u), noncompliance with medications in the setting of poor financial situation, DM, HTN, possible CKD stage III, HTN, hyponatremia. 2D echo 11/28 showed EF 20-25%. Cath showed nonobstructive CAD.   Assessment & Plan    Acute on chronic  systolic CHF and moderate pulmonary HTN  Unsure etiology of cardiomyopathy. Patient was undergoing w/u last November with nuclear stress test.Notes at that time by primary cardiologist were entertaining idea of cath + referral. Patient has struggled financially which has contributed to noncompliance. - Cath showed nonischemic cardiomyopathy, mild PAH. Recommend referral to HF clinic at discharge - Patient has been diuresing well with IV lasix 40mg >> switched to Torsemide 40 mg BID today - Negative 3.3L yesterday and is net neg 13.6L - weight down 21lbs  - creatinine improving, 1.57 > 1.36 -He is not on ACE/ARB/ARNI due to h/o angioedema and also ongoing renal insufficiency.  - continue BB, hydralazine to 25mg  TID and Imdur to 30mg  daily - spir 12.5mg  daily added - continue diuresis  Hypervolemic hyponatremia  -significantly improved with diuresis and fluid restriction (122>124>13> 133)  Renal insufficiency - possible CKD stage III. -No recent Cr on file (1.14 in 12/2013). -Cr relatively stable so far with diuresis(1.76>1.63>1.67> 1.57>1.36) - continue to monitor  Essential HTN - increase hydralazine to 25mg  TID and Imdur to 30mg  daily - continue carvedilol  - Pressures improving  NSVT/PVCs - 10 beats NSVT overnight -Continue BB -K normal. -Mag 1.7 after receiving supplemental mag sulfate>> supplement  DM - per IM.  Noncompliance with regimen presenting hazards to health - care management consulted   For questions or updates, please contact [facility] Please consult [email or web address] for contact info under        Signed, Cadence Ninfa Meeker, PA-C  04/17/2019, 9:43 AM    ---------------------------------------------------------------------------------------------   History and all data above reviewed.  Patient examined.  I agree with the findings as above.  [name] feels well, improvement in symptoms and leg swelling.  Constitutional: No  acute distress Eyes: pupils equally round and reactive to light, sclera non-icteric, normal conjunctiva and lids ENMT: moist mucous membranes Cardiovascular: regular rhythm, normal rate, soft systolic murmur. S1 and S2 normal. Radial pulses normal bilaterally. No jugular venous distention.  Respiratory: clear to auscultation bilaterally GI : normal bowel sounds, soft and nontender. No distention.   MSK: extremities warm, well perfused. Mild edema.  NEURO: grossly nonfocal exam, moves all extremities. PSYCH: alert and oriented x 3, normal mood and affect.   All available labs, radiology testing, previous records reviewed. Agree with documented assessment and plan of my colleague as stated above with the following additions or changes:  Active Problems:   CHF  exacerbation (HCC)   HFrEF (heart failure with reduced ejection fraction) (HCC)   CHF (congestive heart failure) (HCC)   Hyponatremia   Essential hypertension   Uncontrolled diabetes mellitus (HCC)   Renal insufficiency   NSVT (nonsustained ventricular tachycardia) ([place])    Plan: Agree with medication recommendations above. Pt transitioned to torsemide. We Donald arrange HF follow up upon dismissal. Consider outpatient cardiac MR.   Length of Stay:  LOS: 5 days   Elouise Munroe, MD [facility] 11:40 PM  04/17/2019

## 2019-04-17 NOTE — Progress Notes (Signed)
SLP Cancellation Note  Patient Details Name: [name] MRN: [id number] DOB: February 16, 1951   Cancelled treatment:       Reason Eval/Treat Not Completed: Other (comment) Order received for cognitive evaluation to aid in determining pt's capacity for decision making. As discussed with MD, this is not within SLP scope of practice. [name] defer decision to MD.    [name] 04/17/2019, 1:06 PM   Pollyann Glen, M.A. [place] Acute Environmental education officer [phone] Office [phone]

## 2019-04-18 ENCOUNTER — Other Ambulatory Visit: Payer: Self-pay | Admitting: Medical

## 2019-04-18 DIAGNOSIS — E1165 Type 2 diabetes mellitus with hyperglycemia: Secondary | ICD-10-CM

## 2019-04-18 DIAGNOSIS — N179 Acute kidney failure, unspecified: Secondary | ICD-10-CM

## 2019-04-18 LAB — BASIC METABOLIC PANEL
Anion gap: 12 (ref 5–15)
BUN: 23 mg/dL (ref 8–23)
CO2: 32 mmol/L (ref 22–32)
Calcium: 9.3 mg/dL (ref 8.9–10.3)
Chloride: 92 mmol/L — ABNORMAL LOW (ref 98–111)
Creatinine, Ser: 1.57 mg/dL — ABNORMAL HIGH (ref 0.61–1.24)
GFR calc Af Amer: 51 mL/min — ABNORMAL LOW (ref >60)
GFR calc non Af Amer: 44 mL/min — ABNORMAL LOW (ref >60)
Glucose, Bld: 152 mg/dL — ABNORMAL HIGH (ref 70–99)
Potassium: 4.3 mmol/L (ref 3.5–5.1)
Sodium: 136 mmol/L (ref 135–145)

## 2019-04-18 LAB — MAGNESIUM: Magnesium: 1.7 mg/dL (ref 1.7–2.4)

## 2019-04-18 LAB — CBC
HCT: 35.4 % — ABNORMAL LOW (ref 39.0–52.0)
Hemoglobin: 11.5 g/dL — ABNORMAL LOW (ref 13.0–17.0)
MCH: 28.8 pg (ref 26.0–34.0)
MCHC: 32.5 g/dL (ref 30.0–36.0)
MCV: 88.5 fL (ref 80.0–100.0)
Platelets: 243 10*3/uL (ref 150–400)
RBC: 4 MIL/uL — ABNORMAL LOW (ref 4.22–5.81)
RDW: 14.6 % (ref 11.5–15.5)
WBC: 4.7 10*3/uL (ref 4.0–10.5)
nRBC: 0 % (ref 0.0–0.2)

## 2019-04-18 LAB — GLUCOSE, CAPILLARY
Glucose-Capillary: 151 mg/dL — ABNORMAL HIGH (ref 70–99)
Glucose-Capillary: 259 mg/dL — ABNORMAL HIGH (ref 70–99)

## 2019-04-18 MED ORDER — TORSEMIDE 20 MG PO TABS: 40.0000 mg | ORAL_TABLET | Freq: Every day | ORAL | 2 refills | Status: DC

## 2019-04-18 MED ORDER — SPIRONOLACTONE 25 MG PO TABS: 12.5000 mg | ORAL_TABLET | Freq: Every day | ORAL | 2 refills | Status: DC

## 2019-04-18 MED ORDER — HYDRALAZINE HCL 25 MG PO TABS: 25.0000 mg | ORAL_TABLET | Freq: Three times a day (TID) | ORAL | 2 refills | Status: DC

## 2019-04-18 MED ORDER — TORSEMIDE 20 MG PO TABS: 40.0000 mg | ORAL_TABLET | Freq: Every day | ORAL | Status: DC

## 2019-04-18 MED ORDER — CARVEDILOL 6.25 MG PO TABS: 6.2500 mg | ORAL_TABLET | Freq: Two times a day (BID) | ORAL | 2 refills | Status: DC

## 2019-04-18 MED ORDER — ISOSORBIDE MONONITRATE ER 30 MG PO TB24: 30.0000 mg | ORAL_TABLET | Freq: Every day | ORAL | 2 refills | Status: DC

## 2019-04-18 NOTE — Progress Notes (Signed)
Patient was discharged home by MD order; discharged instructions  review and give to patient with care notes; IV DIC; skin intact; [name] be escorted to the car by nurse tech via wheelchair.  

## 2019-04-18 NOTE — Discharge Instructions (Signed)
Dear [name],  Thank you for letting us participate in your care. You were hospitalized for being fluid overloaded due to your heart failure.   POST-HOSPITAL & CARE INSTRUCTIONS 1. Please read dietary instructions below 2. Go to your follow up appointments (listed below) 3. Return to care if symptoms return or fail to improve.   DOCTOR'S APPOINTMENT   Future Appointments  Date Time Provider Cyrus  04/20/2019  1:30 PM ACCESS TO CARE POOL [id number]  04/25/2019  1:45 PM [facility]  04/26/2019  3:00 PM Bhagat, Crista Luria, PA [facility]   Follow-up Information    [place], Crista Luria, PA Follow up on 04/26/2019.   Specialty: Cardiology Why: Please go to hospital follow-up December 10th at 3:00 PM Contact information: [address] [place] [address] [phone]        [facility] Follow up on 04/25/2019.   Why: Please go to the Cardiology office Wednesday December 9th for follow-up labs any time from 7:30-4:00 PM Contact information: [place] [phone] [place]. Go on 04/20/2019.   Specialty: Family Medicine Why: at 1:30pm.  Please arrive 15 minutes early.  If you need to change your appointment time, please call the office at [phone] Contact information: [id number] [place] [phone]          Take care and be well!  [facility]  [place],  [address] [phone]      Low Sodium Nutrition Therapy  Eating less sodium can help you if you have high blood pressure, heart failure, or kidney or liver disease.   Your body needs a little sodium, but too much sodium can cause your body to hold onto extra  water. This extra water Azad raise your blood pressure and can cause damage to your heart, kidneys, or liver as they are forced to work harder.   Sometimes you can see how the extra fluid affects you because your hands, legs, or belly swell. You may also hold water around your heart and lungs, which makes it hard to breathe.   Even if you take medication for blood pressure or a water pill (diuretic) to remove fluid, it is still important to have less salt in your diet.   Check with your primary care provider before drinking alcohol since it may affect the amount of fluid in your body and how your heart, kidneys, or liver work. Sodium in Food A low-sodium meal plan limits the sodium that you get from food and beverages to 1,500-2,000 milligrams (mg) per day. Salt is the main source of sodium. Read the nutrition label on the package to find out how much sodium is in one serving of a food.   Select foods with 140 milligrams (mg) of sodium or less per serving.   You may be able to eat one or two servings of foods with a little more than 140 milligrams (mg) of sodium if you are closely watching how much sodium you eat in a day.   Check the serving size on the label. The amount of sodium listed on the label shows the amount in one serving of the food. So, if you eat more than one serving, you Tomio get more sodium than the amount listed.  Tips  Cutting Back on Sodium  Eat more fresh foods.   Fresh fruits and vegetables are low in sodium, as well as frozen vegetables and fruits that have no added juices or sauces.   Fresh meats are lower in sodium than processed meats, such as bacon, sausage, and hotdogs.   Not all processed foods are unhealthy, but some processed foods may have too much sodium.   Eat less salt at the table and when cooking. One of the ingredients in salt is sodium.   One teaspoon of table salt has 2,300 milligrams of sodium.   Leave the salt out of recipes for pasta,  casseroles, and soups.  Be a Paramedic.   Food packages that say Salt-free, sodium-free, very low sodium, and low sodium have less than 140 milligrams of sodium per serving.   Beware of products identified as Unsalted, No Salt Added, Reduced Sodium, or Lower Sodium. These items may still be high in sodium. You should always check the nutrition label.  Add flavors to your food without adding sodium.   Try lemon juice, lime juice, or vinegar.   Dry or fresh herbs add flavor.   Buy a sodium-free seasoning blend or make your own at home.  You can purchase salt-free or sodium-free condiments like barbeque sauce in stores and online. Ask your registered dietitian nutritionist for recommendations and where to find them.    Eating in Restaurants  Choose foods carefully when you eat outside your home. Restaurant foods can be very high in sodium. Many restaurants provide nutrition facts on their menus or their websites. If you cannot find that information, ask your server. Let your server know that you want your food to be cooked without salt and that you would like your salad dressing and sauces to be served on the side.      Foods Recommended  Food Group  Foods Recommended   Grains  Bread, bagels, rolls without salted tops Homemade bread made with reduced-sodium baking powder Cold cereals, especially shredded wheat and puffed rice Oats, grits, or cream of wheat Pastas, quinoa, and rice Popcorn, pretzels or crackers without salt Corn tortillas   Protein Foods  Fresh meats and fish; [place] bacon (check the nutrition labels - make sure they are not packaged in a sodium solution) Canned or packed tuna (no more than 4 ounces at 1 serving) Beans and peas Soybeans) and tofu Eggs Nuts or nut butters without salt   Dairy  Milk or milk powder Plant milks, such as rice and soy Yogurt, including Greek yogurt Small amounts of natural cheese (blocks of cheese) or  reduced-sodium cheese can be used in moderation. (Swiss, ricotta, and fresh mozzarella cheese are lower in sodium than the others) Cream Cheese Low sodium cottage cheese   Vegetables  Fresh and frozen vegetables without added sauces or salt Homemade soups (without salt) Low-sodium, salt-free or sodium-free canned vegetables and soups   Fruit  Fresh and canned fruits Dried fruits, such as raisins, cranberries, and prunes   Oils  Tub or liquid margarine, regular or without salt Canola, corn, peanut, olive, safflower, or sunflower oils   Condiments  Fresh or dried herbs such as basil, bay leaf, dill, mustard (dry), nutmeg, paprika, parsley, rosemary, sage, or thyme.  Low sodium ketchup Vinegar  Lemon or lime juice Pepper, red pepper flakes, and cayenne. Hot sauce contains sodium, but if you use just a drop or two, it Azaryah not add up to much.  Salt-free or sodium-free seasoning mixes and  marinades Simple salad dressings: vinegar and oil     Foods Not Recommended  Food Group  Foods Not Recommended   Grains  Breads or crackers topped with salt Cereals (hot/cold) with more than 300 mg sodium per serving Biscuits, cornbread, and other quick breads prepared with baking soda Pre-packaged bread crumbs Seasoned and packaged rice and pasta mixes Self-rising flours   Protein Foods  Cured meats: Bacon, ham, sausage, pepperoni and hot dogs Canned meats (chili, vienna sausage, or sardines) Smoked fish and meats Frozen meals that have more than 600 mg of sodium per serving Egg substitute (with added sodium)   Dairy  Buttermilk Processed cheese spreads Cottage cheese (1 cup may have over 500 mg of sodium; look for low-sodium.) American or feta cheese Shredded Cheese has more sodium than blocks of cheese String cheese   Vegetables  Canned vegetables (unless they are salt-free, sodium-free or low sodium) Frozen vegetables with seasoning and sauces Sauerkraut and pickled  vegetables Canned or dried soups (unless they are salt-free, sodium-free, or low sodium) [place] fries and onion rings   Fruit  Dried fruits preserved with additives that have sodium   Oils  Salted butter or margarine, all types of olives   Condiments  Salt, sea salt, kosher salt, onion salt, and garlic salt Seasoning mixes with salt Bouillon cubes Ketchup Barbeque sauce and Worcestershire sauce unless low sodium Soy sauce Salsa, pickles, olives, relish Salad dressings: ranch, blue cheese, [place], and [place].     Low Sodium Sample 1-Day Menu   Breakfast  1 cup cooked oatmeal   1 slice whole wheat bread toast   1 tablespoon peanut butter without salt   1 banana   1 cup 1% milk   Lunch  Tacos made with: 2 corn tortillas    cup black beans, low sodium    cup roasted or grilled chicken (without skin)    avocado   Squeeze of lime juice   1 cup salad greens   1 tablespoon low-sodium salad dressing    cup strawberries   1 orange   Afternoon Snack  1/3 cup grapes   6 ounces yogurt   Evening Meal  3 ounces herb-baked fish   1 baked potato   2 teaspoons olive oil    cup cooked carrots   2 thick slices tomatoes on:   2 lettuce leaves   1 teaspoon olive oil   1 teaspoon balsamic vinegar   1 cup 1% milk   Evening Snack  1 apple    cup almonds without salt     Low-Sodium Vegetarian (Lacto-Ovo) Sample 1-Day Menu   Breakfast  1 cup cooked oatmeal   1 slice whole wheat toast   1 tablespoon peanut butter without salt   1 banana   1 cup 1% milk   Lunch  Tacos made with: 2 corn tortillas    cup black beans, low sodium    cup roasted or grilled chicken (without skin)    avocado   Squeeze of lime juice   1 cup salad greens   1 tablespoon low-sodium salad dressing    cup strawberries   1 orange   Evening Meal  Stir fry made with:  cup tofu   1 cup brown rice    cup broccoli    cup green beans    cup  peppers    tablespoon peanut oil   1 orange   1 cup 1% milk   Evening Snack  4 strips celery  2 tablespoons hummus   1 hard-boiled egg     Low-Sodium Vegan Sample 1-Day Menu   Breakfast  1 cup cooked oatmeal   1 tablespoon peanut butter without salt   1 cup blueberries   1 cup soymilk fortified with calcium, vitamin B12, and vitamin D   Lunch  1 small whole wheat pita    cup cooked lentils   2 tablespoons hummus   4 carrot sticks   1 medium apple   1 cup soymilk fortified with calcium, vitamin B12, and vitamin D   Evening Meal  Stir fry made with:  cup tofu   1 cup brown rice    cup broccoli    cup green beans    cup peppers    tablespoon peanut oil   1 cup cantaloupe   Evening Snack  1 cup soy yogurt    cup mixed nuts   Copyright 2020  Academy of Nutrition and Dietetics. All rights reserved    Sodium Free Flavoring Tips    When cooking, the following items may be used for flavoring instead of salt or seasonings that contain sodium.  Remember: A little bit of spice goes a long way! Be careful not to overseason.  Spice Blend Recipe (makes about ? cup)  5 teaspoons onion powder   2 teaspoons garlic powder   2 teaspoons paprika   2 teaspoon dry mustard   1 teaspoon crushed thyme leaves    teaspoon white pepper    teaspoon celery seed Food Item Flavorings  Beef Basil, bay leaf, caraway, curry, dill, dry mustard, garlic, grape jelly, green pepper, mace, marjoram, mushrooms (fresh), nutmeg, onion or onion powder, parsley, pepper, rosemary, sage  Chicken Basil, cloves, cranberries, mace, mushrooms (fresh), nutmeg, oregano, paprika, parsley, pineapple, saffron, sage, savory, tarragon, thyme, tomato, turmeric  Egg Chervil, curry, dill, dry mustard, garlic or garlic powder, green pepper, jelly, mushrooms (fresh), nutmeg, onion powder, paprika, parsley, rosemary, tarragon, tomato  Fish Basil, bay leaf, chervil, curry,  dill, dry mustard, green pepper, lemon juice, marjoram, mushrooms (fresh), paprika, pepper, tarragon, tomato, turmeric  Lamb Cloves, curry, dill, garlic or garlic powder, mace, mint, mint jelly, onion, oregano, parsley, pineapple, rosemary, tarragon, thyme  Pork Applesauce, basil, caraway, chives, cloves, garlic or garlic powder, onion or onion powder, rosemary, thyme  Veal Apricots, basil, bay leaf, currant jelly, curry, ginger, marjoram, mushrooms (fresh), oregano, paprika  Vegetables Basil, dill, garlic or garlic powder, ginger, lemon juice, mace, marjoram, nutmeg, onion or onion powder, tarragon, tomato, sugar or sugar substitute, salt-free salad dressing, vinegar  Desserts Allspice, anise, cinnamon, cloves, ginger, mace, nutmeg, vanilla extract, other extracts   Copyright 2020  Academy of Nutrition and Dietetics. All rights reserved  Fluid Restricted Nutrition Therapy  You have been prescribed this diet because your condition affects how much fluid you can eat or drink. If your heart, liver, or kidneys aren't working properly, you may not be able to effectively eliminate fluids from the body and this may cause swelling (edema) in the legs, arms, and/or stomach. Drink no more than _________ liters or ________ ounces or ________cups of fluid per day.   You don't need to stop eating or drinking the same fluids you normally would, but you may need to eat or drink less than usual.   Your registered dietitian nutritionist Jaime help you determine the correct amount of fluid to consume during the day Breakfast Include fluids taken with medications  Lunch Include fluids taken with medications  Dinner Include fluids  taken with medications  Bedtime Snack Include fluids taken with medications     Tips What Are Fluids?  A fluid is anything that is liquid or anything that would melt if left at room temperature. You Finneas need to count these foods and liquids--including any liquid used to take  medication--as part of your daily fluid intake. Some examples are:  Alcohol (drink only with your doctor's permission)   Coffee, tea, and other hot beverages   Gelatin (Jell-O)   Gravy   Ice cream, sherbet, sorbet   Ice cubes, ice chips   Milk, liquid creamer   Nutritional supplements   Popsicles   Vegetable and fruit juices; fluid in canned fruit   Watermelon   Yogurt   Soft drinks, lemonade, limeade   Soups   Syrup How Do I Measure My Fluid Intake?  Record your fluid intake daily.   Tip: Every day, each time you eat or drink fluids, pour water in the same amount into an empty container that can hold the same amount of fluids you are allowed daily. This may help you keep track of how much fluid you are taking in throughout the day.   To accurately keep track of how much liquid you take in, measure the size of the cups, glasses, and bowls you use. If you eat soup, measure how much of it is liquid and how much is solid (such as noodles, vegetables, meat). Conversions for Measuring Fluid Intake  Milliliters (mL) Liters (L) Ounces (oz) Cups (c)  1000 1 32 4  1200 1.2 40 5  1500 1.5 50 6 1/4  1800 1.8 60 7 1/2  2000 2 67 8 1/3  Tips to Reduce Your Thirst  Chew gum or suck on hard candy.   Rinse or gargle with mouthwash. Do not swallow.   Ice chips or popsicles my help quench thirst, but this too needs to be calculated into the total restriction. Melt ice chips or cubes first to figure out how much fluid they produce (for example, experiment with melting  cup ice chips or 2 ice cubes).   Add a lemon wedge to your water.   Limit how much salt you take in. A high salt intake might make you thirstier.   Don't eat or drink all your allowed liquids at once. Space your liquids out through the day.   Use small glasses and cups and sip slowly. If allowed, take your medications with fluids you eat or drink during a meal.   Fluid-Restricted Nutrition Therapy Sample  1-Day Menu  Breakfast 1 slice wheat toast  1 tablespoon peanut butter  1/2 cup yogurt (120 milliliters)  1/2 cup blueberries  1 cup milk (240 milliliters)   Lunch 3 ounces sliced [place]  2 slices whole wheat bread  1/2 cup lettuce for sandwich  2 slices tomato for sandwich  1 ounce reduced-fat, reduced-sodium cheese  1/2 cup fresh carrot sticks  1 banana  1 cup unsweetened tea (240 milliliters)   Evening Meal 8 ounces soup (240 milliliters)  3 ounces salmon  1/2 cup quinoa  1 cup green beans  1 cup mixed greens salad  1 tablespoon olive oil  1 cup coffee (240 milliliters)  Evening Snack 1/2 cup sliced peaches  1/2 cup frozen yogurt (120 milliliters)  1 cup water (240 milliliters)  Copyright 2020  Academy of Nutrition and Dietetics. All rights reserved

## 2019-04-18 NOTE — Progress Notes (Signed)
Physical Therapy Treatment Patient Details Name: [name] MRN: [id number] DOB: 07/07/1950 Today's Date: 04/18/2019    History of Present Illness [name] is a 68 y.o. male presenting with leg swelling, shortness of breath. PMH is significant for CHFejection fraction of 25 to 30%, DM2, HTN.  Admitted with acute CHF, possible AKI, and HTN. s/p cardiac cath 11/30.    PT Comments    Patient is making good progress with PT.  From a mobility standpoint anticipate patient Meghan be ready for DC home when medically ready.   Follow Up Recommendations  No PT follow up;Supervision - Intermittent     Equipment Recommendations  None recommended by PT    Recommendations for Other Services       Precautions / Restrictions Restrictions Weight Bearing Restrictions: No    Mobility  Bed Mobility               General bed mobility comments: pt sitting EOB upon arrival  Transfers Overall transfer level: Independent Equipment used: None                Ambulation/Gait Ambulation/Gait assistance: Min assist;Modified independent (Device/Increase time) Gait Distance (Feet): 250 Feet Assistive device: None Gait Pattern/deviations: Step-through pattern;Wide base of support;Decreased stride length     General Gait Details: decreased cadence but grossly steady gait; no LOB with head turns and able to increased gait speed with cues   Stairs Stairs: Yes Stairs assistance: Supervision Stair Management: One rail Right;Alternating pattern;Step to pattern;Forwards Number of Stairs: 10 General stair comments: supervision for safety; single UE support needed; pt able to alternate pattern when ascending and step to when descending   Wheelchair Mobility    Modified Rankin (Stroke Patients Only)       Balance     Sitting balance-Leahy Scale: Normal       Standing balance-Leahy Scale: Good                              Cognition Arousal/Alertness:  Awake/alert Behavior During Therapy: WFL for tasks assessed/performed Overall Cognitive Status: Within Functional Limits for tasks assessed                                        Exercises      General Comments General comments (skin integrity, edema, etc.): SpO2 and HR WNL on RA      Pertinent Vitals/Pain Pain Assessment: No/denies pain    Home Living                      Prior Function            PT Goals (current goals can now be found in the care plan section) Progress towards PT goals: Progressing toward goals    Frequency    Min 3X/week      PT Plan Current plan remains appropriate    Co-evaluation              AM-PAC PT "6 Clicks" Mobility   Outcome Measure  Help needed turning from your back to your side while in a flat bed without using bedrails?: None Help needed moving from lying on your back to sitting on the side of a flat bed without using bedrails?: None Help needed moving to and from a bed to a chair (including a wheelchair)?: A Little  Help needed standing up from a chair using your arms (e.g., wheelchair or bedside chair)?: A Little Help needed to walk in hospital room?: A Little Help needed climbing 3-5 steps with a railing? : A Little 6 Click Score: 20    End of Session Equipment Utilized During Treatment: Gait belt Activity Tolerance: Patient tolerated treatment well Patient left: with call bell/phone within reach;in bed Nurse Communication: Mobility status PT Visit Diagnosis: Unsteadiness on feet (R26.81);Other abnormalities of gait and mobility (R26.89)     Time: 1340-1401 PT Time Calculation (min) (ACUTE ONLY): 21 min  Charges:  $Gait Training: 8-22 mins                     Earney Navy, PTA Acute Rehabilitation Services Pager: [phone] Office: [phone]     Darliss Cheney 04/18/2019, 3:52 PM

## 2019-04-18 NOTE — Progress Notes (Signed)
Patient has episode of NSVT, VT highest was 14bts throughout the night. Patient has no complains.  electrolytes repleted. Awaiting AM  lab works. [name] monitor

## 2019-04-18 NOTE — Progress Notes (Addendum)
Progress Note  Patient Name: [name] Date of Encounter: 04/18/2019  Primary Cardiologist: Jenkins Rouge, MD   Subjective   Patient states his legs are feeling much lighter this morning. No chest pain or SOB.. Discussed social concerns of medical treatment. Episodes of NSVT on telemetry, longest 17 beats.  Inpatient Medications    Scheduled Meds: . calcium carbonate  800 mg of elemental calcium Oral BID WC  . carvedilol  6.25 mg Oral BID WC  . enoxaparin (LOVENOX) injection  40 mg Subcutaneous Q24H  . hydrALAZINE  25 mg Oral Q8H  . insulin aspart  0-6 Units Subcutaneous TID WC  . isosorbide mononitrate  30 mg Oral Daily  . spironolactone  12.5 mg Oral Daily  . torsemide  40 mg Oral BID   Continuous Infusions: . sodium chloride     PRN Meds: sodium chloride, acetaminophen, guaiFENesin-dextromethorphan, ondansetron (ZOFRAN) IV, sodium chloride flush   Vital Signs    Vitals:   04/17/19 1144 04/17/19 1613 04/17/19 2029 04/18/19 0559  BP: 126/87 (!) 152/93 127/83 (!) 137/92  Pulse: 86 96 89 87  Resp: 19 19 20 20   Temp: 99.2 F (37.3 C) 99.3 F (37.4 C) 98.2 F (36.8 C) 97.9 F (36.6 C)  TempSrc: Oral Oral Oral Oral  SpO2: 98% 99% 100% 100%  Weight:    90.6 kg  Height:        Intake/Output Summary (Last 24 hours) at 04/18/2019 0842 Last data filed at 04/18/2019 0636 Gross per 24 hour  Intake 860 ml  Output 3930 ml  Net -3070 ml   Last 3 Weights 04/18/2019 04/17/2019 04/16/2019  Weight (lbs) 199 lb 11.2 oz 207 lb 11.2 oz 212 lb 11.2 oz  Weight (kg) 90.583 kg 94.212 kg 96.48 kg      Telemetry    NSR, HR 80-90s, episodes of NSVT, longest 17 beats; PVCs - Personally Reviewed  ECG    No new - Personally Reviewed  Physical Exam   GEN: No acute distress.   Neck: No JVD Cardiac: RRR, no murmurs, rubs, or gallops.  Respiratory: Clear to auscultation bilaterally. GI: Soft, nontender, non-distended  MS: Mild edema; No deformity. Neuro:  Nonfocal  Psych:  Normal affect   Labs    High Sensitivity Troponin:  No results for input(s): TROPONINIHS in the last 720 hours.    Chemistry Recent Labs  Lab 04/12/19 2022  04/13/19 0817  04/15/19 0435 04/16/19 0500  04/16/19 0817 04/17/19 0537 04/18/19 0408  NA  --    < > 122*   < > 131* 133*   < > 138 134* 136  K  --    < > 4.2   < > 3.9 4.0   < > 2.6* 3.6 4.3  CL  --    < > 90*   < > 91* 94*  --   --  92* 92*  CO2  --    < > 21*   < > 27 27  --   --  28 32  GLUCOSE  --    < > 208*   < > 97 160*  --   --  141* 152*  BUN  --    < > 18   < > 27* 26*  --   --  20 23  CREATININE  --    < > 1.58*   < > 1.67* 1.57*  --   --  1.36* 1.57*  CALCIUM  --    < >  7.7*   < > 8.9 9.0  --   --  8.8* 9.3  PROT 7.1  --   --   --  5.9*  --   --   --   --   --   ALBUMIN 3.3*  --  3.3*  --  2.9*  --   --   --   --   --   AST 48*  --   --   --  39  --   --   --   --   --   ALT 42  --   --   --  43  --   --   --   --   --   ALKPHOS 102  --   --   --  130*  --   --   --   --   --   BILITOT 1.0  --   --   --  0.7  --   --   --   --   --   GFRNONAA  --    < > 44*   < > 41* 44*  --   --  53* 44*  GFRAA  --    < > 51*   < > 48* 51*  --   --  >60 51*  ANIONGAP  --    < > 11   < > 13 12  --   --  14 12   < > = values in this interval not displayed.     Hematology Recent Labs  Lab 04/16/19 0500  04/16/19 0817 04/17/19 0537 04/18/19 0408  WBC 5.5  --   --  5.0 4.7  RBC 3.93*  --   --  3.68* 4.00*  HGB 11.4*   < > 10.2* 10.7* 11.5*  HCT 33.8*   < > 30.0* 31.4* 35.4*  MCV 86.0  --   --  85.3 88.5  MCH 29.0  --   --  29.1 28.8  MCHC 33.7  --   --  34.1 32.5  RDW 14.3  --   --  14.6 14.6  PLT 230  --   --  217 243   < > = values in this interval not displayed.    BNP Recent Labs  Lab 04/12/19 1559  BNP 2,832.0*     DDimer No results for input(s): DDIMER in the last 168 hours.   Radiology    No results found.  Cardiac Studies   2D echo 04/13/19 IMPRESSIONS  1. Left ventricular ejection  fraction, by visual estimation, is 20 to 25%. The left ventricle has severely decreased function. Left ventricular septal wall thickness was mildly increased. There is mildly increased left ventricular hypertrophy. 2. Mildly dilated left ventricular internal cavity size. 3. Diffuse hypokinesis. 4. Global right ventricle has mildly reduced systolic function.The right ventricular size is mildly enlarged. No increase in right ventricular wall thickness. 5. Left atrial size was moderately dilated. 6. Right atrial size was moderately dilated. 7. The mitral valve is normal in structure. Mild mitral valve regurgitation. No evidence of mitral stenosis. 8. The tricuspid valve is normal in structure. Tricuspid valve regurgitation moderate-severe. 9. The aortic valve is tricuspid. Aortic valve regurgitation is mild. Mild aortic valve sclerosis without stenosis. 10. The pulmonic valve was grossly normal. Pulmonic valve regurgitation is mild. 11. Moderately elevated pulmonary artery systolic pressure. 12. The inferior vena cava is dilated in size with <50% respiratory variability, suggesting right atrial  pressure of 15 mmHg.  Cardiac Cath 04/16/19 Findings:  Ao = 110/68 (88) LV = 110/6 RA = 2 RV = 44/2 PA = 42/15 (24) PCW = 15 Fick cardiac output/index = 8.6/4.0 PVR = 0.5 WU SVR 798 Ao sat = 93% PA sat = 70%, 72% High SVC = 72%  Assessment:  1. Normal coronary arteries 2. 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 Mild PAH due to high cardiac output otherwise normal hemdynamics  Plan/Discussion:  Medical therapy. Consider referral to HF Clinic for med titration .  Patient Profile     68 y.o. male with chronic systolic CHF (EF [id number] by echo 24% by nuc 03/2018 without further w/u), noncompliance with medications in the setting of poor financial situation, DM, HTN, possible CKD stage III, HTN, hyponatremia. 2D echo 11/28 showed EF 20-25%. Cath showed nonobstructive CAD.   Assessment &  Plan    Acute on chronic systolic CHF and moderate pulmonary HTN Unsureetiology of cardiomyopathy. Cath showed nonischemic cardiomyopathy, mild PAH. Recommend referral to HF clinic at discharge - Consider Cardiac MRI outpatient -IV lasix switched to switched to Torsemide 40 mg BID today -Negative13.6L since admission - weight down 29lbs  -He is not on ACE/ARB/ARNI due to h/o angioedema and also ongoing renal insufficiency.  - continueBB,hydralazine, Imdur, and spir 12.5mg   - Creatinine/BUN slightly up this morning. Patient with trace edema on exam. Can hold diuretics. [name] likely need a lower maintenance dose at discharge.  Renal insufficiency - possible CKD stage III. -Cr relatively stable so far 1.67> 1.57>1.36 >1.57 - continue to monitor  Essential HTN -continue hydralazine to 25mg  TID and Imdur to 30mg  daily, and carvedilol - Pressures improving  NSVT/PVCs - 17 beats NSVT overnight -Continue BB -K normal. -Mag 1.7 after receiving supplemental mag sulfate>> supplement  DM - per IM.  Social Issues - consult care management   For questions or updates, please contact [facility] Please consult [email or web address] for contact info under        Signed, Cadence Ninfa Meeker, PA-C  04/18/2019, 8:42 AM    ---------------------------------------------------------------------------------------------   History and all data above reviewed.  Patient examined.  I agree with the findings as above.  [name] feels well, breathing comfortably.   Constitutional: No acute distress Eyes: pupils equally round and reactive to light, sclera non-icteric, normal conjunctiva and lids ENMT: moist mucous membranes Cardiovascular: regular rhythm, normal rate, no murmurs. S1 and S2 normal. Radial pulses normal bilaterally. No jugular venous distention.  Respiratory: clear to auscultation bilaterally GI : normal bowel sounds, soft and nontender. No distention.    MSK: extremities warm, well perfused. No edema.  NEURO: grossly nonfocal exam, moves all extremities. PSYCH: alert and oriented x 3, normal mood and affect.   All available labs, radiology testing, previous records reviewed. Agree with documented assessment and plan of my colleague as stated above with the following additions or changes:  Active Problems:   CHF exacerbation (HCC)   HFrEF (heart failure with reduced ejection fraction) (HCC)   CHF (congestive heart failure) (HCC)   Hyponatremia   Essential hypertension   Uncontrolled diabetes mellitus (HCC)   Renal insufficiency   NSVT (nonsustained ventricular tachycardia) ([place])    Plan: Continues to diurese on torsemide 40 mg twice daily, and is feeling better.  He has a nonischemic cardiomyopathy, and if renal function is better as an outpatient, would consider obtaining a cardiac MRI to further investigate his nonischemic cardiomyopathy.  Continue on carvedilol, would recommend increasing the dose to 12.5  mg twice daily for the benefit of his heart rate and nonsustained ventricular tachycardia. Continue spironolactone, hydralazine and Imdur. If renal function is still abnormal at follow up with PCP on 04/20/2019, would hold spironolactone and recheck labs in 5-7 days. May need uptitration of diuresis as outpatient, can be assessed at close follow up.   Length of Stay:  LOS: 6 days   Elouise Munroe, MD [facility] 4:04 PM  04/18/2019

## 2019-04-19 ENCOUNTER — Telehealth: Payer: Self-pay

## 2019-04-19 NOTE — Telephone Encounter (Signed)
**Note De-identified Katherleen Folkes Obfuscation** -----  **Note De-Identified Delories Mauri Obfuscation** Message from [place], PA-C sent at 04/18/2019 10:54 AM EST ----- Regarding: TOC phone call Good morning,  This patient is being discharged from the hospital today for Acute Heart Failure. He has a TOC visit for next Thursday and lab work the day before. Can a TOC phone call please be arranged?  Thanks,  Cadence

## 2019-04-19 NOTE — Telephone Encounter (Signed)
TCM Call 1st attempt: No answer so I left a message on the pts VM asking him to call us back.

## 2019-04-19 NOTE — Telephone Encounter (Signed)
Patient returning call.

## 2019-04-20 ENCOUNTER — Encounter: Payer: Self-pay | Admitting: Family Medicine

## 2019-04-20 ENCOUNTER — Other Ambulatory Visit: Payer: Self-pay

## 2019-04-20 ENCOUNTER — Inpatient Hospital Stay: Payer: Medicare Other

## 2019-04-20 ENCOUNTER — Ambulatory Visit (INDEPENDENT_AMBULATORY_CARE_PROVIDER_SITE_OTHER): Payer: Medicare Other | Admitting: Family Medicine

## 2019-04-20 VITALS — BP 127/80 | HR 100 | Wt 199.2 lb

## 2019-04-20 DIAGNOSIS — I5020 Unspecified systolic (congestive) heart failure: Secondary | ICD-10-CM | POA: Diagnosis not present

## 2019-04-20 DIAGNOSIS — I10 Essential (primary) hypertension: Secondary | ICD-10-CM | POA: Diagnosis not present

## 2019-04-20 DIAGNOSIS — E119 Type 2 diabetes mellitus without complications: Secondary | ICD-10-CM | POA: Insufficient documentation

## 2019-04-20 DIAGNOSIS — N289 Disorder of kidney and ureter, unspecified: Secondary | ICD-10-CM | POA: Diagnosis not present

## 2019-04-20 NOTE — Assessment & Plan Note (Addendum)
Hospital follow-up for recent exacerbation from 11/26-12/2.  Significant improvement, doing well on current regimen, generally euvolemic on exam with the exception of mild lower extremity edema.  Weight stable.  Oluwatimileyin increase his carvedilol to 12.5 mg twice daily per recommendations of cardiology team during inpatient stay for further heart rate control/nonsustained VT and recheck his renal function/magnesium. -Continue torsemide, hydralazine, spironolactone, and Imdur as prescribed -Increase carvedilol to 12.5 mg twice daily -Check BMP, mag today -Counseled on low-salt diet and reduced daily fluid intake -Follow-up with cardiology on 12/10 -Instructed after he receives his weight scale to inform us if he gains 3 pounds in 1 day or 5 pounds in 1 week

## 2019-04-20 NOTE — Assessment & Plan Note (Signed)
Well-controlled, A1c 7.4 in 03/2019.  On Metformin, recently increased to 1000 mg twice daily.  Tolerating this well.  Ariz have him follow-up with his new PCP, [name], in the next month to discuss further diabetes management.  Can consider SGLT2 inhibitor in the future for further renal and heart failure benefits.

## 2019-04-20 NOTE — Assessment & Plan Note (Signed)
Well-controlled on current regimen for HFrEF as above.  Has BP cuff at home, however is unsure how to work it.  [place] have him bring in his cuff on his next appointment to show him how to use this and take his blood pressure at home as needed.

## 2019-04-20 NOTE — Patient Instructions (Signed)
It was so wonderful seeing you today.  We are going to increase your carvedilol to 12.5 mg in the morning and at night from your previous 6.25 mg.  Additionally, you should be hearing from cardiology about getting a scale at your home and I Ad place an order today for you to get a blood pressure cuff.  If you gain 3 pounds in 1 day or 5 pounds within 1 week, please let us know.  I would like you to follow-up in a few weeks with your new PCP, [name] for your diabetes.  You do not need to check your sugar on a regular basis, only if you have any extreme lightheadedness/dizziness, tunnel vision, fatigue, nausea, abdominal pains.

## 2019-04-20 NOTE — Telephone Encounter (Signed)
LMTCB for TCM call

## 2019-04-20 NOTE — Progress Notes (Signed)
Subjective:    Patient ID: [name], male    DOB: [birth year], 68 y.o.   MRN: [id number]   CC: Hospital follow-up  HPI: [name] is a 68 year old gentleman with hypertension, diabetes, renal insufficiency, nonsustained ventricular tachycardia, and HFrEF presenting discuss the following:  Hospital follow-up/HFrEF: He was admitted from 11/26-12/2 for acute on chronic heart failure with reduced ejection fraction.  Echo in 03/2019 showing EF of 20-25% with severely decreased LV function, felt to be nonischemic cardiomyopathy per cardiology.  He was aggressively diuresed with Lasix during his stay, decreased upwards of 20 pounds of fluid.  Discharged home on torsemide 40 mg daily, carvedilol 6.25 mg twice daily, hydralazine 25 mg 3 times daily, spironolactone 12.5 mg, and Imdur 30 mg daily.  His HCTZ was stopped during his stay. Discharge weight 200 pounds.  Today he is doing quite well.  Feels like he is almost back to baseline, except for some minor swelling in his bilateral lower extremity.  He is exercising on elliptical on a regular basis, says it gives him a lot of energy.  He has a blood pressure cuff at home, however is not sure how to work it.  Additionally still waiting on his weight scale.  He denies any chest pain, dyspnea on exertion, shortness of breath, orthopnea, PND, lightheadedness/dizziness, worsening swelling.  His weight is 199 pounds today, thinks his dry weight is around about this area as well.   Smoking status reviewed  Review of Systems Per HPI    Objective:  BP 127/80   Pulse 100   Wt 199 lb 3.2 oz (90.4 kg)   SpO2 99%   BMI 29.42 kg/m  Vitals and nursing note reviewed  General: NAD, pleasant HEENT: Did not appreciate any JVD bilaterally. Cardiac: RRR, normal heart sounds Respiratory: CTAB, normal effort, no crackles noted throughout lung fields bilaterally Abdomen: soft, nontender, nondistended Extremities: +1 pitting edema to mid shin bilaterally,  lower extremity equal in size.  No tenderness to palpation.  2+ dorsalis pedis pulses bilaterally. Skin: warm and dry, no rashes noted Neuro: alert and oriented, no focal deficits Psych: normal affect  Assessment & Plan:   HFrEF (heart failure with reduced ejection fraction) [facility]) Hospital follow-up for recent exacerbation from 11/26-12/2.  Significant improvement, doing well on current regimen, generally euvolemic on exam with the exception of mild lower extremity edema.  Weight stable.  [name] increase his carvedilol to 12.5 mg twice daily per recommendations of cardiology team during inpatient stay for further heart rate control/nonsustained VT and recheck his renal function/magnesium. -Continue torsemide, hydralazine, spironolactone, and Imdur as prescribed -Increase carvedilol to 12.5 mg twice daily -Check BMP, mag today -Counseled on low-salt diet and reduced daily fluid intake -Follow-up with cardiology on 12/10 -Instructed after he receives his weight scale to inform us if he gains 3 pounds in 1 day or 5 pounds in 1 week  Essential hypertension Well-controlled on current regimen for HFrEF as above.  Has BP cuff at home, however is unsure how to work it.  [name] have him bring in his cuff on his next appointment to show him how to use this and take his blood pressure at home as needed.  Diabetes mellitus without complication ([name]) Well-controlled, A1c 7.4 in 03/2019.  On Metformin, recently increased to 1000 mg twice daily.  Tolerating this well.  [name] have him follow-up with his new PCP, [name], in the next month to discuss further diabetes management.  Can consider SGLT2 inhibitor in the  future for further renal and heart failure benefits.   Follow-up in approximately 1 month with PCP for diabetes, CHF follow-up, sooner if needed.  [facility] Resident PGY-2

## 2019-04-21 LAB — BASIC METABOLIC PANEL
BUN/Creatinine Ratio: 18 (ref 10–24)
BUN: 31 mg/dL — ABNORMAL HIGH (ref 8–27)
CO2: 29 mmol/L (ref 20–29)
Calcium: 8.9 mg/dL (ref 8.6–10.2)
Chloride: 95 mmol/L — ABNORMAL LOW (ref 96–106)
Creatinine, Ser: 1.68 mg/dL — ABNORMAL HIGH (ref 0.76–1.27)
GFR calc Af Amer: 47 mL/min/{1.73_m2} — ABNORMAL LOW (ref >59)
GFR calc non Af Amer: 41 mL/min/{1.73_m2} — ABNORMAL LOW (ref >59)
Glucose: 201 mg/dL — ABNORMAL HIGH (ref 65–99)
Potassium: 3.9 mmol/L (ref 3.5–5.2)
Sodium: 139 mmol/L (ref 134–144)

## 2019-04-21 LAB — MAGNESIUM: Magnesium: 1.7 mg/dL (ref 1.6–2.3)

## 2019-04-23 NOTE — Telephone Encounter (Signed)
TCM Call 3rd attempt: I left a message on the pts VM asking him to call us back at [phone]. I also left a reminder of his scheduled lab work appt on 12/9 and his OV appt with Robbie Lis, PA-c on 12/10 at 3:00.

## 2019-04-25 ENCOUNTER — Other Ambulatory Visit: Payer: Medicare Other

## 2019-04-25 NOTE — Progress Notes (Signed)
Cardiology Office Note    Date:  04/26/2019   ID:  [name], DOB 09-17-1950, MRN [id number]  PCP:  Lucas Fee, DO  Cardiologist:  Dr. Johnsie Cancel Brittian be followed in CHF clinic   Chief Complaint: Hospital follow up   History of Present Illness:   [name] is a 68 y.o. male with history of chronic systolic heart failure secondary to nonischemic cardiomyopathy, noncompliance due to poor financial situation, diabetes mellitus, hypertension, CKD stage III seen for hospital follow-up.  History of chronic systolic CHF.  EF 25 to 30% by echo and 24% by nuclear 04/05/2018>> no follow-up.  Most recently, admitted November 2020  with acute CHF exacerbation and moderate pulmonary hypertension.  Echo showed LV function of 20 to 25%.  Normal coronaries with mild pulmonary hypertension due to high cardiac output otherwise normal hemodynamic.  Cardiac MRI as outpatient. He is not on ACE/ARB/ARNIdue to h/o angioedema and also ongoing renal insufficiency.   Seen by PCP 12/4. Doing well. Increase coreg to 12.5mg  BID. Held spironolactone for AKI. Waiting scal form [facility].   Here today for follow up. Report feeling great. LE and abdominal  swelling has gone down. Taking his medications. His BP is 142/84 today. It was normal during 12/4 visit. No SOB, CP, orthopnea, PND or syncope. Seem still have poor in-site of his health.    Past Medical History:  Diagnosis Date  . Diabetes mellitus without complication ([place])   . Heart failure with reduced ejection fraction ([place])   . Hypertension     Past Surgical History:  Procedure Laterality Date  . RIGHT/LEFT HEART CATH AND CORONARY ANGIOGRAPHY N/A 04/16/2019   Procedure: RIGHT/LEFT HEART CATH AND CORONARY ANGIOGRAPHY;  Surgeon: Lucas Artist, MD;  Location: [place] [facility];  Service: Cardiovascular;  Laterality: N/A;    Current Medications:  Prior to Admission medications   Medication Sig Start Date End Date Taking?  Authorizing Provider  Alfalfa 500 MG TABS Take 1,000 mg by mouth daily.   Yes [provider]  Ascorbic Acid (VITAMIN C PO) Take 1 tablet by mouth daily.   Yes [provider]  ASHWAGANDHA PO Take 1 tablet by mouth daily.   Yes [provider]  b complex vitamins tablet Take 1 tablet by mouth daily.   Yes [provider]  Biotin 1000 MCG tablet Take 1,000 mcg by mouth daily.   Yes [provider]  carvedilol (COREG) 6.25 MG tablet Take 6.25 mg by mouth 4 (four) times daily.   Yes [provider]  Cinnamon 500 MG capsule Take 1,000 mg by mouth daily.   Yes [provider]  Docosahexaenoic Acid (DHA PO) Take 1 capsule by mouth daily.   Yes [provider]  Ginkgo Biloba 40 MG TABS Take 40 mg by mouth at bedtime.   Yes [provider]  hydrALAZINE (APRESOLINE) 25 MG tablet Take 1 tablet (25 mg total) by mouth every 8 (eight) hours. 04/18/19 07/17/19 Yes Tammi Klippel, Sherin, DO  isosorbide mononitrate (IMDUR) 30 MG 24 hr tablet Take 1 tablet (30 mg total) by mouth daily. 04/19/19 07/18/19 Yes [name], Sherin, DO  metFORMIN (GLUCOPHAGE) 1000 MG tablet Take 1,000 mg by mouth 2 (two) times daily with a meal.   Yes [provider]  Tetrahydrozoline HCl (VISINE OP) Place 1 drop into both eyes daily as needed (dry eyes).   Yes [provider]  torsemide (DEMADEX) 20 MG tablet Take 2 tablets (40 mg total) by mouth daily. 04/19/19  07/18/19 Yes Tammi Klippel, Sherin, DO  TURMERIC PO Take 1 capsule by mouth daily.   Yes [provider]  VITAMIN D PO Take 1 capsule by mouth daily.   Yes [provider]  Zinc 50 MG TABS Take 50 mg by mouth daily.   Yes [provider]    Allergies:   Lisinopril and Other   Social History   Socioeconomic History  . Marital status: Divorced    Spouse name: Not on file  . Number of children: Not on file  . Years of education: Not on file  . Highest education level:  Not on file  Occupational History  . Not on file  Tobacco Use  . Smoking status: Never Smoker  . Smokeless tobacco: Never Used  Substance and Sexual Activity  . Alcohol use: No  . Drug use: No  . Sexual activity: Not on file  Other Topics Concern  . Not on file  Social History Narrative   Lives with son. Previously worked for [employer].    Social Determinants of Health   Financial Resource Strain:   . Difficulty of Paying Living Expenses: Not on file  Food Insecurity:   . Worried About Charity fundraiser in the Last Year: Not on file  . Ran Out of Food in the Last Year: Not on file  Transportation Needs:   . Lack of Transportation (Medical): Not on file  . Lack of Transportation (Non-Medical): Not on file  Physical Activity:   . Days of Exercise per Week: Not on file  . Minutes of Exercise per Session: Not on file  Stress:   . Feeling of Stress : Not on file  Social Connections:   . Frequency of Communication with Friends and Family: Not on file  . Frequency of Social Gatherings with Friends and Family: Not on file  . Attends Religious Services: Not on file  . Active Member of Clubs or Organizations: Not on file  . Attends Archivist Meetings: Not on file  . Marital Status: Not on file     Family History:  The patient's family history includes Heart attack in his brother; Heart failure in his brother and mother.  ROS:   Please see the history of present illness.    ROS All other systems reviewed and are negative.   PHYSICAL EXAM:   VS:  BP (!) 142/84   Pulse 93   Ht 5\' 9"  (1.753 m)   Wt 205 lb 9.6 oz (93.3 kg)   SpO2 99%   BMI 30.36 kg/m    GEN: Well nourished, well developed, in no acute distress  HEENT: normal  Neck: no JVD, carotid bruits, or masses Cardiac: RRR; no murmurs, rubs, or gallops, trace left lower extremity edema Respiratory:  clear to auscultation bilaterally, normal work of breathing GI: soft, nontender, nondistended, +  BS MS: no deformity or atrophy  Skin: warm and dry, no rash Neuro:  Alert and Oriented x 3, Strength and sensation are intact Psych: euthymic mood, full affect  Wt Readings from Last 3 Encounters:  04/26/19 205 lb 9.6 oz (93.3 kg)  04/20/19 199 lb 3.2 oz (90.4 kg)  04/18/19 199 lb 11.2 oz (90.6 kg)      Studies/Labs Reviewed:   EKG:  EKG is not ordered today.   Recent Labs: 04/12/2019: B Natriuretic Peptide 2,832.0 04/13/2019: TSH 2.258 04/15/2019: ALT 43 04/18/2019: Hemoglobin 11.5; Platelets 243 04/20/2019: BUN 31; Creatinine, Ser 1.68; Magnesium 1.7; Potassium 3.9; Sodium  139   Lipid Panel    Component Value Date/Time   CHOL 116 04/15/2019 0435   TRIG 53 04/15/2019 0435   HDL 43 04/15/2019 0435   CHOLHDL 2.7 04/15/2019 0435   VLDL 11 04/15/2019 0435   LDLCALC 62 04/15/2019 0435    Additional studies/ records that were reviewed today include:   2D echo 04/13/19 IMPRESSIONS  1. Left ventricular ejection fraction, by visual estimation, is 20 to 25%. The left ventricle has severely decreased function. Left ventricular septal wall thickness was mildly increased. There is mildly increased left ventricular hypertrophy. 2. Mildly dilated left ventricular internal cavity size. 3. Diffuse hypokinesis. 4. Global right ventricle has mildly reduced systolic function.The right ventricular size is mildly enlarged. No increase in right ventricular wall thickness. 5. Left atrial size was moderately dilated. 6. Right atrial size was moderately dilated. 7. The mitral valve is normal in structure. Mild mitral valve regurgitation. No evidence of mitral stenosis. 8. The tricuspid valve is normal in structure. Tricuspid valve regurgitation moderate-severe. 9. The aortic valve is tricuspid. Aortic valve regurgitation is mild. Mild aortic valve sclerosis without stenosis. 10. The pulmonic valve was grossly normal. Pulmonic valve regurgitation is mild. 11. Moderately elevated  pulmonary artery systolic pressure. 12. The inferior vena cava is dilated in size with <50% respiratory variability, suggesting right atrial pressure of 15 mmHg.  Cardiac Cath 04/16/19 Findings:  Ao = 110/68 (88) LV = 110/6 RA = 2 RV = 44/2 PA = 42/15 (24) PCW = 15 Fick cardiac output/index = 8.6/4.0 PVR = 0.5 WU SVR 798 Ao sat = 93% PA sat = 70%, 72% High SVC = 72%  Assessment:  1. Normal coronary arteries 2. 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 Mild PAH due to high cardiac output otherwise normal hemdynamics  Plan/Discussion:  Medical therapy. Consider referral to HF Clinic for med titration .   ASSESSMENT & PLAN:    1. Chronic systolic heart failure/nonischemic cardiomyopathy His symptoms has improved since discharge.  He getting home health as well.  Seems somewhat poor insight of his condition.  Discussed low-sodium diet and sliding scale diuretics.  He has gained 6 pounds since last office visit however reports improved symptoms.  Not significantly volume overloaded by exam.  Reviewed with Dr. Johnsie Cancel in clinic today.  He recommended aggressive heart failure therapy and follow-up in CHF clinic.  If he has persistent low EF despite good therapy and medication compliance Gildo consider cardiac MRI. Increase carvedilol further to 25 mg twice daily.  Continue hydralazine 25 mg 3 times daily, Imdur 30 mg daily and torsemide 40 mg daily.  Spironolactone held by PCP due to patient worsening renal function. Not on ACE/ARB due to CKD and hx of angioedema.  2.  Chronic kidney disease stage III - meds as above  3.  Hypertension - minimally up today. Increase coreg as above.   4.  Diabetes mellitus - Per PCP  Medication Adjustments/Labs and Tests Ordered: Current medicines are reviewed at length with the patient today.  Concerns regarding medicines are outlined above.  Medication changes, Labs and Tests ordered today are listed in the Patient Instructions below. Patient  Instructions  Medication Instructions:  INCREASE: Carvedilol to 25 mg twice a day   IF you gain 3 pounds overnight or 5 pounds in a week take an extra torsemide   *If you need a refill on your cardiac medications before your next appointment, please call your pharmacy*  Lab Work: None ordered   If you have labs (  blood work) drawn today and your tests are completely normal, you Sonya receive your results only by: Marland Kitchen MyChart Message (if you have MyChart) OR . A paper copy in the mail If you have any lab test that is abnormal or we need to change your treatment, we Elai call you to review the results.  Testing/Procedures: None ordered   Follow-Up: Your physician has referred you to the heart failure clinic   Other Instructions None      Signed, Leanor Kail, [place]  04/26/2019 3:50 PM    [facility] [place] [place], [place], [place]  [address] Phone: [phone]; Fax: [phone]

## 2019-04-26 ENCOUNTER — Ambulatory Visit (INDEPENDENT_AMBULATORY_CARE_PROVIDER_SITE_OTHER): Payer: Medicare Other | Admitting: Physician Assistant

## 2019-04-26 ENCOUNTER — Other Ambulatory Visit: Payer: Self-pay

## 2019-04-26 ENCOUNTER — Encounter: Payer: Self-pay | Admitting: Physician Assistant

## 2019-04-26 VITALS — BP 142/84 | HR 93 | Ht 69.0 in | Wt 205.6 lb

## 2019-04-26 DIAGNOSIS — I5022 Chronic systolic (congestive) heart failure: Secondary | ICD-10-CM | POA: Diagnosis not present

## 2019-04-26 DIAGNOSIS — N183 Chronic kidney disease, stage 3 unspecified: Secondary | ICD-10-CM

## 2019-04-26 DIAGNOSIS — I428 Other cardiomyopathies: Secondary | ICD-10-CM | POA: Diagnosis not present

## 2019-04-26 DIAGNOSIS — I509 Heart failure, unspecified: Secondary | ICD-10-CM

## 2019-04-26 DIAGNOSIS — I10 Essential (primary) hypertension: Secondary | ICD-10-CM

## 2019-04-26 MED ORDER — CARVEDILOL 25 MG PO TABS: 25.0000 mg | ORAL_TABLET | Freq: Two times a day (BID) | ORAL | 3 refills | Status: DC

## 2019-04-26 NOTE — Patient Instructions (Signed)
Medication Instructions:  INCREASE: Carvedilol to 25 mg twice a day   IF you gain 3 pounds overnight or 5 pounds in a week take an extra torsemide   *If you need a refill on your cardiac medications before your next appointment, please call your pharmacy*  Lab Work: None ordered   If you have labs (blood work) drawn today and your tests are completely normal, you Khristopher receive your results only by: Marland Kitchen MyChart Message (if you have MyChart) OR . A paper copy in the mail If you have any lab test that is abnormal or we need to change your treatment, we Audie call you to review the results.  Testing/Procedures: None ordered   Follow-Up: Your physician has referred you to the heart failure clinic   Other Instructions None

## 2019-05-14 ENCOUNTER — Other Ambulatory Visit: Payer: Self-pay | Admitting: Family Medicine

## 2019-05-24 ENCOUNTER — Other Ambulatory Visit: Payer: Self-pay

## 2019-05-24 ENCOUNTER — Encounter (HOSPITAL_COMMUNITY): Payer: Self-pay

## 2019-05-24 ENCOUNTER — Ambulatory Visit (HOSPITAL_COMMUNITY)
Admission: RE | Admit: 2019-05-24 | Discharge: 2019-05-24 | Disposition: A | Discharge status: Home / Self Care | Payer: Medicare Other | Source: Ambulatory Visit | Attending: Cardiology | Admitting: Cardiology

## 2019-05-24 VITALS — BP 160/94 | HR 86 | Wt 190.2 lb

## 2019-05-24 DIAGNOSIS — Z79899 Other long term (current) drug therapy: Secondary | ICD-10-CM | POA: Diagnosis not present

## 2019-05-24 DIAGNOSIS — Z8249 Family history of ischemic heart disease and other diseases of the circulatory system: Secondary | ICD-10-CM | POA: Diagnosis not present

## 2019-05-24 DIAGNOSIS — Z888 Allergy status to other drugs, medicaments and biological substances status: Secondary | ICD-10-CM | POA: Diagnosis not present

## 2019-05-24 DIAGNOSIS — I130 Hypertensive heart and chronic kidney disease with heart failure and stage 1 through stage 4 chronic kidney disease, or unspecified chronic kidney disease: Secondary | ICD-10-CM | POA: Insufficient documentation

## 2019-05-24 DIAGNOSIS — I428 Other cardiomyopathies: Secondary | ICD-10-CM

## 2019-05-24 DIAGNOSIS — I083 Combined rheumatic disorders of mitral, aortic and tricuspid valves: Secondary | ICD-10-CM | POA: Diagnosis not present

## 2019-05-24 DIAGNOSIS — I5022 Chronic systolic (congestive) heart failure: Secondary | ICD-10-CM

## 2019-05-24 DIAGNOSIS — I5020 Unspecified systolic (congestive) heart failure: Secondary | ICD-10-CM | POA: Diagnosis not present

## 2019-05-24 DIAGNOSIS — N183 Chronic kidney disease, stage 3 unspecified: Secondary | ICD-10-CM | POA: Diagnosis not present

## 2019-05-24 DIAGNOSIS — Z91048 Other nonmedicinal substance allergy status: Secondary | ICD-10-CM | POA: Diagnosis not present

## 2019-05-24 DIAGNOSIS — Z7984 Long term (current) use of oral hypoglycemic drugs: Secondary | ICD-10-CM | POA: Diagnosis not present

## 2019-05-24 DIAGNOSIS — E1122 Type 2 diabetes mellitus with diabetic chronic kidney disease: Secondary | ICD-10-CM | POA: Diagnosis not present

## 2019-05-24 DIAGNOSIS — I10 Essential (primary) hypertension: Secondary | ICD-10-CM | POA: Diagnosis not present

## 2019-05-24 LAB — BASIC METABOLIC PANEL
Anion gap: 8 (ref 5–15)
BUN: 29 mg/dL — ABNORMAL HIGH (ref 8–23)
CO2: 29 mmol/L (ref 22–32)
Calcium: 8.9 mg/dL (ref 8.9–10.3)
Chloride: 103 mmol/L (ref 98–111)
Creatinine, Ser: 1.93 mg/dL — ABNORMAL HIGH (ref 0.61–1.24)
GFR calc Af Amer: 40 mL/min — ABNORMAL LOW (ref >60)
GFR calc non Af Amer: 35 mL/min — ABNORMAL LOW (ref >60)
Glucose, Bld: 188 mg/dL — ABNORMAL HIGH (ref 70–99)
Potassium: 3.6 mmol/L (ref 3.5–5.1)
Sodium: 140 mmol/L (ref 135–145)

## 2019-05-24 NOTE — Patient Instructions (Signed)
Labs today We Olaf only contact you if something comes back abnormal or we need to make some changes. Otherwise no news is good news!  We have our pharmacist looking into co pay options for [place] and [place], we [place] be in touch with further details.  Your physician recommends that you schedule a follow-up appointment in: 1-2 weeks  in the Advanced Practitioners (PA/NP) Clinic   Your physician recommends that you schedule a follow-up appointment in: 6 weeks with Dr Haroldine Laws  Do the following things EVERYDAY: 1) Weigh yourself in the morning before breakfast. Write it down and keep it in a log. 2) Take your medicines as prescribed 3) Eat low salt foods--Limit salt (sodium) to 2000 mg per day.  4) Stay as active as you can everyday 5) Limit all fluids for the day to less than 2 liters  At the [facility], you and your health needs are our priority. As part of our continuing mission to provide you with exceptional heart care, we have created designated Provider Care Teams. These Care Teams include your primary Cardiologist (physician) and Advanced Practice Providers (APPs- Physician Assistants and Nurse Practitioners) who all work together to provide you with the care you need, when you need it.   You may see any of the following providers on your designated Care Team at your next follow up: Marland Kitchen Dr Glori Bickers . Dr Loralie Champagne . Darrick Grinder, NP . Lyda Jester, PA . Audry Riles, PharmD   Please be sure to bring in all your medications bottles to every appointment.

## 2019-05-24 NOTE — Progress Notes (Signed)
Advanced Heart Failure Clinic Note   Referring Physician: PCP: Gerlene Fee, DO PCP-Cardiologist: Jenkins Rouge, MD  AHFC: New (Dr. Haroldine Laws did [facility] 04/16/19)  HPI: 69 y/o AAM w/ h/o chronic systolic HF 2/2 NICM, 123456, HTN, Stage III CKD and h/o poor noncompliance due to poor financial situation. Has h/o angioedema w/ lisinopril.   Per chart review, he was diagnosed w/ HF in 03/2018. Echo showed EF of 25-30%. He was referred to [facility] by Dr. Claudie Leach for ischemic w/u. NST was arranged and showed apical artifact w/ possible peri-infarct ischemia involving the mid to distal inferior wall and septum. EF 24% by nuc study. Unfortunately, the patient did not return for additional f/u.   He was recently admitted to [facility] 03/2019 for acute CHF w/ volume overload. Was diuresed w/ IV lasix. R/LHC was preformed by Dr. Haroldine Laws w/ findings outlined below. Essentially normal coronaries, EF 30-35% c/w NICM and mild PAH due to high cardiac output, but otherwise normal hemodynamics. cMRi recommended as outpatient. He was placed on limited medical therapy. He is not on ACE/ARB/ARNIdue to h/o angioedema. He is currently on coreg, spironolactone, Imdur, hydralazine and torsemide.   Today in clinic, he reports that he feels well. He brought all of his meds to review and seems like has a good grasp on how and when to take them. Uses pill box. Reports full compliance w/ meds. No side effects. Took AM meds about 3 hrs ago and BP elevated 160/95. Denies tobacco and ETOH. Moderate sodium consumption. Exercise tolerance is pretty good. Denies symptoms with mild-moderate activity. No resting dyspnea. Chronically sleeps on 2 pillows. No PND. No LEE. Checks wt at home and denies wt gain. [facility]ed [facility]. He reports family h/o CHF, mother and brother.    2D echo 04/13/19  IMPRESSIONS  1. Left ventricular ejection fraction, by visual estimation, is 20 to 25%. The left ventricle has severely  decreased function. Left ventricular septal wall thickness was mildly increased. There is mildly increased left ventricular hypertrophy. 2. Mildly dilated left ventricular internal cavity size. 3. Diffuse hypokinesis. 4. Global right ventricle has mildly reduced systolic function.The right ventricular size is mildly enlarged. No increase in right ventricular wall thickness. 5. Left atrial size was moderately dilated. 6. Right atrial size was moderately dilated. 7. The mitral valve is normal in structure. Mild mitral valve regurgitation. No evidence of mitral stenosis. 8. The tricuspid valve is normal in structure. Tricuspid valve regurgitation moderate-severe. 9. The aortic valve is tricuspid. Aortic valve regurgitation is mild. Mild aortic valve sclerosis without stenosis. 10. The pulmonic valve was grossly normal. Pulmonic valve regurgitation is mild. 11. Moderately elevated pulmonary artery systolic pressure. 12. The inferior vena cava is dilated in size with <50% respiratory variability, suggesting right atrial pressure of 15 mmHg.  [facility] 11/40/20  Conclusion  Findings:  Ao = 110/68 (88) LV = 110/6 RA =  2 RV = 44/2 PA = 42/15 (24) PCW = 15 Fick cardiac output/index = 8.6/4.0 PVR = 0.5 WU SVR 798 Ao sat = 93% PA sat = 70%, 72% High SVC = 72%  Assessment:  1. Normal coronary arteries 2. 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 Mild PAH due to high cardiac output otherwise normal hemdynamics  Plan/Discussion:  Medical therapy. Consider referral to HF Clinic for med titration .      Review of Systems: [y] = yes, [ ]  = no   General: Weight gain [ ] ; Weight loss [ ] ; Anorexia [ ] ; Fatigue [ ] ;  Fever [ ] ; Chills [ ] ; Weakness [ ]   Cardiac: Chest pain/pressure [ ] ; Resting SOB [ ] ; Exertional SOB [ ] ; Orthopnea [ ] ; Pedal Edema [ ] ; Palpitations [ ] ; Syncope [ ] ; Presyncope [ ] ; Paroxysmal nocturnal dyspnea[ ]   Pulmonary: Cough [ ] ; Wheezing[ ] ; Hemoptysis[ ] ; Sputum [  ]; Snoring [ ]   GI: Vomiting[ ] ; Dysphagia[ ] ; Melena[ ] ; Hematochezia [ ] ; Heartburn[ ] ; Abdominal pain [ ] ; Constipation [ ] ; Diarrhea [ ] ; BRBPR [ ]   GU: Hematuria[ ] ; Dysuria [ ] ; Nocturia[ ]   Vascular: Pain in legs with walking [ ] ; Pain in feet with lying flat [ ] ; Non-healing sores [ ] ; Stroke [ ] ; TIA [ ] ; Slurred speech [ ] ;  Neuro: Headaches[ ] ; Vertigo[ ] ; Seizures[ ] ; Paresthesias[ ] ;Blurred vision [ ] ; Diplopia [ ] ; Vision changes [ ]   Ortho/Skin: Arthritis [ ] ; Joint pain [ ] ; Muscle pain [ ] ; Joint swelling [ ] ; Back Pain [ ] ; Rash [ ]   Psych: Depression[ ] ; Anxiety[ ]   Heme: Bleeding problems [ ] ; Clotting disorders [ ] ; Anemia [ ]   Endocrine: Diabetes [ ] ; Thyroid dysfunction[ ]    Past Medical History:  Diagnosis Date  . Diabetes mellitus without complication ([place])   . Heart failure with reduced ejection fraction ([place])   . Hypertension     Current Outpatient Medications  Medication Sig Dispense Refill  . Alfalfa 500 MG TABS Take 1,000 mg by mouth daily.    . Ascorbic Acid (VITAMIN C PO) Take 1 tablet by mouth daily.    . ASHWAGANDHA PO Take 1 tablet by mouth daily.    Marland Kitchen b complex vitamins tablet Take 1 tablet by mouth daily.    . Biotin 1000 MCG tablet Take 1,000 mcg by mouth daily.    . carvedilol (COREG) 25 MG tablet Take 1 tablet (25 mg total) by mouth 2 (two) times daily. 180 tablet 3  . Cinnamon 500 MG capsule Take 1,000 mg by mouth daily.    . Docosahexaenoic Acid (DHA PO) Take 1 capsule by mouth daily.    . Ginkgo Biloba 40 MG TABS Take 40 mg by mouth at bedtime.    . hydrALAZINE (APRESOLINE) 25 MG tablet TAKE 1 TABLET BY MOUTH EVERY 8 HOURS 270 tablet 1  . isosorbide mononitrate (IMDUR) 30 MG 24 hr tablet TAKE 1 TABLET BY MOUTH DAILY 90 tablet 1  . metFORMIN (GLUCOPHAGE) 1000 MG tablet Take 1,000 mg by mouth 2 (two) times daily with a meal.    . Tetrahydrozoline HCl (VISINE OP) Place 1 drop into both eyes daily as needed (dry eyes).    . torsemide (DEMADEX)  20 MG tablet TAKE 2 TABLETS BY MOUTH DAILY 180 tablet 1  . TURMERIC PO Take 1 capsule by mouth daily.    Marland Kitchen VITAMIN D PO Take 1 capsule by mouth daily.    . Zinc 50 MG TABS Take 50 mg by mouth daily.     No current facility-administered medications for this encounter.    Allergies  Allergen Reactions  . Lisinopril Swelling    Lip swelling  . Other Other (See Comments)    Seasonal allergies      Social History   Socioeconomic History  . Marital status: Divorced    Spouse name: Not on file  . Number of children: Not on file  . Years of education: Not on file  . Highest education level: Not on file  Occupational History  . Not on file  Tobacco Use  .  Smoking status: Never Smoker  . Smokeless tobacco: Never Used  Substance and Sexual Activity  . Alcohol use: No  . Drug use: No  . Sexual activity: Not on file  Other Topics Concern  . Not on file  Social History Narrative   Lives with son. Previously worked for [employer].    Social Determinants of Health   Financial Resource Strain:   . Difficulty of Paying Living Expenses: Not on file  Food Insecurity:   . Worried About Charity fundraiser in the Last Year: Not on file  . Ran Out of Food in the Last Year: Not on file  Transportation Needs:   . Lack of Transportation (Medical): Not on file  . Lack of Transportation (Non-Medical): Not on file  Physical Activity:   . Days of Exercise per Week: Not on file  . Minutes of Exercise per Session: Not on file  Stress:   . Feeling of Stress : Not on file  Social Connections:   . Frequency of Communication with Friends and Family: Not on file  . Frequency of Social Gatherings with Friends and Family: Not on file  . Attends Religious Services: Not on file  . Active Member of Clubs or Organizations: Not on file  . Attends Archivist Meetings: Not on file  . Marital Status: Not on file  Intimate Partner Violence:   . Fear of Current or Ex-Partner: Not on  file  . Emotionally Abused: Not on file  . Physically Abused: Not on file  . Sexually Abused: Not on file      Family History  Problem Relation Age of Onset  . Heart failure Mother   . Heart failure Brother   . Heart attack Brother     Vitals:   05/24/19 0911  BP: (!) 160/94  Pulse: 86  SpO2: 99%  Weight: 86.3 kg (190 lb 3.2 oz)     PHYSICAL EXAM: General:  Well appearing. No respiratory difficulty HEENT: normal Neck: supple. no JVD. Carotids 2+ bilat; no bruits. No lymphadenopathy or thyromegaly appreciated. Cor: PMI nondisplaced. Regular rate & rhythm. No rubs, gallops or murmurs. Lungs: clear Abdomen: soft, nontender, nondistended. No hepatosplenomegaly. No bruits or masses. Good bowel sounds. Extremities: no cyanosis, clubbing, rash, edema Neuro: alert & oriented x 3, cranial nerves grossly intact. moves all 4 extremities w/o difficulty. Affect pleasant.  ECG: not performed    ASSESSMENT & PLAN:  1. Chronic Systolic HF/ NICM: Diagnosed in 2019. Most recent Echo 11/20 w/ EF 30-35%. [facility] 11/20 showed normal coronary arteries. Mild PAH due to high cardiac output otherwise normal hemodynamics. Denies ETOH use. May be familial. Mother and brother both had CHF. HTN also possible etiology. - NYHA Class II - Volume stable on Exam but BP elevated - Not on ACE/ARB/ARNI due to h/o Angioedema - Continue Coreg 25 mg bid - Continue spironolactone 25 mg daily - Continue Imdur 30  - Continue hydralazine 25 tid  - Check BMP today. If renal function ok, Ala plan to start a SGLT2i (Luca ask pharmD to do cost comparison, [place] vs [place]). If SGLT2i is started, Jermari reduce torsemide dose down from 40 to 20 mg daily and plan f/u BMP in 7 days.  - may consider cMRI if renal function ok, pending BMP.  2. HTN: elevated. Med adjustments as above   3. DM: plan to add an SGLT2i per above.   F/u in 1-2 weeks w/ PharmD or APP. F/u w/ Dr. Haroldine Laws in  4-6 weeks.   Lyda Jester, PA-C 05/24/19

## 2019-05-25 ENCOUNTER — Telehealth (HOSPITAL_COMMUNITY): Payer: Self-pay | Admitting: Pharmacist

## 2019-05-25 ENCOUNTER — Telehealth (HOSPITAL_COMMUNITY): Payer: Self-pay | Admitting: Cardiology

## 2019-05-25 MED ORDER — FARXIGA 10 MG PO TABS: 10.0000 mg | ORAL_TABLET | Freq: Every day | ORAL | 11 refills | Status: DC

## 2019-05-25 NOTE — Telephone Encounter (Signed)
PATIENT RETURNED [place], [place], [place] [employer]  05/25/2019 3:39 PM EST    ATTEMPTED TO CONTACT PATIENT NO ANSWER, UNABLE TO LEAVE MESSAGE    Consuelo Pandy, PA-C  05/25/2019 11:31 AM EST    Start Farxiga 10 mg daily .[place] need 30 day card for [place]. Lauren to see about assistance w/ copay. Repeat BMP in 7-10 days.

## 2019-05-25 NOTE — Telephone Encounter (Addendum)
Obtained [place] for copay assistance with [place].  Member ID: [id number] Group ID: [id number] RxBin ID: [id number] PCN: [id number] Eligibility Start Date: 02/24/2019 Eligibility End Date: 05/23/2020  Assistance Amount: $1,000.00  Updated [place] pharmacy with card information.   Audry Riles, PharmD, BCPS, BCCP, CPP Heart Failure Clinic Pharmacist [phone]

## 2019-05-25 NOTE — Telephone Encounter (Signed)
-----   Message from Consuelo Pandy, [place] sent at 05/25/2019 11:31 AM EST ----- Start Farxiga 10 mg daily .[place] need 30 day card for [place]. Lauren to see about assistance w/ copay. Repeat BMP in 7-10 days.

## 2019-05-28 ENCOUNTER — Other Ambulatory Visit: Payer: Self-pay | Admitting: Family Medicine

## 2019-05-28 MED ORDER — METFORMIN HCL 1000 MG PO TABS: 1000.0000 mg | ORAL_TABLET | Freq: Two times a day (BID) | ORAL | 0 refills | Status: DC

## 2019-05-28 NOTE — Telephone Encounter (Signed)
Pt stopped by to change his pharmacy to [facility]. And also to request a refill on his Metformin and to make all medications in the future go to [facility].

## 2019-06-05 ENCOUNTER — Encounter (HOSPITAL_COMMUNITY): Payer: Medicare Other

## 2019-06-05 ENCOUNTER — Other Ambulatory Visit: Payer: Self-pay

## 2019-06-05 ENCOUNTER — Encounter (HOSPITAL_COMMUNITY): Payer: Self-pay

## 2019-06-05 ENCOUNTER — Ambulatory Visit (HOSPITAL_COMMUNITY)
Admission: RE | Admit: 2019-06-05 | Discharge: 2019-06-05 | Disposition: A | Discharge status: Home / Self Care | Payer: Medicare Other | Source: Ambulatory Visit | Attending: Cardiology | Admitting: Cardiology

## 2019-06-05 VITALS — BP 168/94 | HR 91 | Wt 189.8 lb

## 2019-06-05 DIAGNOSIS — Z79899 Other long term (current) drug therapy: Secondary | ICD-10-CM | POA: Insufficient documentation

## 2019-06-05 DIAGNOSIS — I082 Rheumatic disorders of both aortic and tricuspid valves: Secondary | ICD-10-CM | POA: Insufficient documentation

## 2019-06-05 DIAGNOSIS — Z7901 Long term (current) use of anticoagulants: Secondary | ICD-10-CM | POA: Insufficient documentation

## 2019-06-05 DIAGNOSIS — I130 Hypertensive heart and chronic kidney disease with heart failure and stage 1 through stage 4 chronic kidney disease, or unspecified chronic kidney disease: Secondary | ICD-10-CM | POA: Diagnosis not present

## 2019-06-05 DIAGNOSIS — Z8249 Family history of ischemic heart disease and other diseases of the circulatory system: Secondary | ICD-10-CM | POA: Insufficient documentation

## 2019-06-05 DIAGNOSIS — Z7984 Long term (current) use of oral hypoglycemic drugs: Secondary | ICD-10-CM | POA: Insufficient documentation

## 2019-06-05 DIAGNOSIS — E1122 Type 2 diabetes mellitus with diabetic chronic kidney disease: Secondary | ICD-10-CM | POA: Diagnosis not present

## 2019-06-05 DIAGNOSIS — N183 Chronic kidney disease, stage 3 unspecified: Secondary | ICD-10-CM | POA: Insufficient documentation

## 2019-06-05 DIAGNOSIS — Z9119 Patient's noncompliance with other medical treatment and regimen: Secondary | ICD-10-CM | POA: Diagnosis not present

## 2019-06-05 DIAGNOSIS — I5022 Chronic systolic (congestive) heart failure: Secondary | ICD-10-CM | POA: Diagnosis not present

## 2019-06-05 DIAGNOSIS — I428 Other cardiomyopathies: Secondary | ICD-10-CM | POA: Insufficient documentation

## 2019-06-05 LAB — BASIC METABOLIC PANEL
Anion gap: 12 (ref 5–15)
BUN: 39 mg/dL — ABNORMAL HIGH (ref 8–23)
CO2: 28 mmol/L (ref 22–32)
Calcium: 10 mg/dL (ref 8.9–10.3)
Chloride: 98 mmol/L (ref 98–111)
Creatinine, Ser: 2.34 mg/dL — ABNORMAL HIGH (ref 0.61–1.24)
GFR calc Af Amer: 32 mL/min — ABNORMAL LOW (ref >60)
GFR calc non Af Amer: 27 mL/min — ABNORMAL LOW (ref >60)
Glucose, Bld: 171 mg/dL — ABNORMAL HIGH (ref 70–99)
Potassium: 3.7 mmol/L (ref 3.5–5.1)
Sodium: 138 mmol/L (ref 135–145)

## 2019-06-05 NOTE — Patient Instructions (Signed)
Lab work done today. We Wilfredo notify you of any abnormal lab work. No news is good news!  Please keep follow up appointments in February and March.  At the [facility], you and your health needs are our priority. As part of our continuing mission to provide you with exceptional heart care, we have created designated Provider Care Teams. These Care Teams include your primary Cardiologist (physician) and Advanced Practice Providers (APPs- Physician Assistants and Nurse Practitioners) who all work together to provide you with the care you need, when you need it.   You may see any of the following providers on your designated Care Team at your next follow up: Marland Kitchen Dr Glori Bickers . Dr Loralie Champagne . Darrick Grinder, NP . Lyda Jester, PA . Audry Riles, PharmD   Please be sure to bring in all your medications bottles to every appointment.

## 2019-06-05 NOTE — Progress Notes (Signed)
Advanced Heart Failure Clinic Note   Referring Physician: PCP: Gerlene Fee, DO PCP-Cardiologist: Jenkins Rouge, MD  AHFC: New (Dr. 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 Has h/o angioedema w/ lisinopril.   Per chart review, he was diagnosed w/ HF in 03/2018. Echo showed EF of 25-30%. He was referred to [facility] by Dr. Claudie Leach for ischemic w/u. NST was arranged and showed apical artifact w/ possible peri-infarct ischemia involving the mid to distal inferior wall and septum. EF 24% by nuc study. Unfortunately, the patient did not return for additional f/u.   He was recently admitted to [facility] 03/2019 for acute CHF w/ volume overload. Was diuresed w/ IV lasix. R/LHC was preformed by Dr. Haroldine Laws w/ findings outlined below. Essentially normal coronaries, EF 30-35% c/w NICM and mild PAH due to high cardiac output, but otherwise normal hemodynamics. cMRi recommended as outpatient. He was placed on limited medical therapy. He is not on ACE/ARB/ARNIdue to h/o angioedema. On coreg, spironolactone, Imdur, hydralazine and torsemide.   At last clinic visit, he was doing fairly well. NYHA II but BP was elevated. An SGLT2i was added to his regimen. We started Farxiga 10 mg daily.   He presents back to clinic for f/u. Reports he is doing well. Reports compliance w/ Wilder Glade and other HF meds. Tolerating new addition well w/o side effects. No issues w/ hypotension. Feels that his abdomen is "less bloated". His wt is fairly stable since last visit. BP elevated 168/92. He attributes this to "white coat syndrome". Reports he checks his BP at home regularly and it is usually well controlled. He claims he checked it this am and it was 128/83.      2D echo 04/13/19  IMPRESSIONS  1. Left ventricular ejection fraction, by visual estimation, is 20 to 25%. The left ventricle  has severely decreased function. Left ventricular septal wall thickness was mildly increased. There is mildly increased left ventricular hypertrophy. 2. Mildly dilated left ventricular internal cavity size. 3. Diffuse hypokinesis. 4. Global right ventricle has mildly reduced systolic function.The right ventricular size is mildly enlarged. No increase in right ventricular wall thickness. 5. Left atrial size was moderately dilated. 6. Right atrial size was moderately dilated. 7. The mitral valve is normal in structure. Mild mitral valve regurgitation. No evidence of mitral stenosis. 8. The tricuspid valve is normal in structure. Tricuspid valve regurgitation moderate-severe. 9. The aortic valve is tricuspid. Aortic valve regurgitation is mild. Mild aortic valve sclerosis without stenosis. 10. The pulmonic valve was grossly normal. Pulmonic valve regurgitation is mild. 11. Moderately elevated pulmonary artery systolic pressure. 12. The inferior vena cava is dilated in size with <50% respiratory variability, suggesting right atrial pressure of 15 mmHg.  [facility] 11/40/20  Conclusion  Findings:  Ao = 110/68 (88) LV = 110/6 RA =  2 RV = 44/2 PA = 42/15 (24) PCW = 15 Fick cardiac output/index = 8.6/4.0 PVR = 0.5 WU SVR 798 Ao sat = 93% PA sat = 70%, 72% High SVC = 72%  Assessment:  1. Normal coronary arteries 2. 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 Mild PAH due to high cardiac output otherwise normal hemdynamics  Plan/Discussion:  Medical therapy. Consider referral to HF Clinic for med titration .      Review of Systems: [y] = yes, [ ]  = no   General: Weight gain [ ] ; Weight loss [ ] ;  Anorexia [ ] ; Fatigue [ ] ; Fever [ ] ; Chills [ ] ; Weakness [ ]   Cardiac: Chest pain/pressure [ ] ; Resting SOB [ ] ; Exertional SOB [ ] ; Orthopnea [ ] ; Pedal Edema [ ] ; Palpitations [ ] ; Syncope [ ] ; Presyncope [ ] ; Paroxysmal nocturnal dyspnea[ ]   Pulmonary: Cough [ ] ; Wheezing[ ] ; Hemoptysis[  ]; Sputum [ ] ; Snoring [ ]   GI: Vomiting[ ] ; Dysphagia[ ] ; Melena[ ] ; Hematochezia [ ] ; Heartburn[ ] ; Abdominal pain [ ] ; Constipation [ ] ; Diarrhea [ ] ; BRBPR [ ]   GU: Hematuria[ ] ; Dysuria [ ] ; Nocturia[ ]   Vascular: Pain in legs with walking [ ] ; Pain in feet with lying flat [ ] ; Non-healing sores [ ] ; Stroke [ ] ; TIA [ ] ; Slurred speech [ ] ;  Neuro: Headaches[ ] ; Vertigo[ ] ; Seizures[ ] ; Paresthesias[ ] ;Blurred vision [ ] ; Diplopia [ ] ; Vision changes [ ]   Ortho/Skin: Arthritis [ ] ; Joint pain [ ] ; Muscle pain [ ] ; Joint swelling [ ] ; Back Pain [ ] ; Rash [ ]   Psych: Depression[ ] ; Anxiety[ ]   Heme: Bleeding problems [ ] ; Clotting disorders [ ] ; Anemia [ ]   Endocrine: Diabetes [ ] ; Thyroid dysfunction[ ]    Past Medical History:  Diagnosis Date  . Diabetes mellitus without complication ([place])   . Heart failure with reduced ejection fraction ([place])   . Hypertension     Current Outpatient Medications  Medication Sig Dispense Refill  . Alfalfa 500 MG TABS Take 1,000 mg by mouth daily.    . Ascorbic Acid (VITAMIN C PO) Take 1 tablet by mouth daily.    . ASHWAGANDHA PO Take 1 tablet by mouth daily.    Marland Kitchen b complex vitamins tablet Take 1 tablet by mouth daily.    . Biotin 1000 MCG tablet Take 1,000 mcg by mouth daily.    . carvedilol (COREG) 25 MG tablet Take 1 tablet (25 mg total) by mouth 2 (two) times daily. 180 tablet 3  . Cinnamon 500 MG capsule Take 1,000 mg by mouth daily.    . dapagliflozin propanediol (FARXIGA) 10 MG TABS tablet Take 10 mg by mouth daily before breakfast. 30 tablet 11  . Docosahexaenoic Acid (DHA PO) Take 1 capsule by mouth daily.    . Ginkgo Biloba 40 MG TABS Take 40 mg by mouth at bedtime.    . hydrALAZINE (APRESOLINE) 25 MG tablet TAKE 1 TABLET BY MOUTH EVERY 8 HOURS 270 tablet 1  . isosorbide mononitrate (IMDUR) 30 MG 24 hr tablet TAKE 1 TABLET BY MOUTH DAILY 90 tablet 1  . metFORMIN (GLUCOPHAGE) 1000 MG tablet Take 1 tablet (1,000 mg total) by mouth 2 (two)  times daily with a meal. 180 tablet 0  . Tetrahydrozoline HCl (VISINE OP) Place 1 drop into both eyes daily as needed (dry eyes).    . torsemide (DEMADEX) 20 MG tablet TAKE 2 TABLETS BY MOUTH DAILY 180 tablet 1  . TURMERIC PO Take 1 capsule by mouth daily.    Marland Kitchen VITAMIN D PO Take 1 capsule by mouth daily.    . Zinc 50 MG TABS Take 50 mg by mouth daily.     No current facility-administered medications for this encounter.    Allergies  Allergen Reactions  . Lisinopril Swelling    Lip swelling  . Other Other (See Comments)    Seasonal allergies      Social History   Socioeconomic History  . Marital status: Divorced    Spouse name: Not on file  . Number of children:  Not on file  . Years of education: Not on file  . Highest education level: Not on file  Occupational History  . Not on file  Tobacco Use  . Smoking status: Never Smoker  . Smokeless tobacco: Never Used  Substance and Sexual Activity  . Alcohol use: No  . Drug use: No  . Sexual activity: Not on file  Other Topics Concern  . Not on file  Social History Narrative   Lives with son. Previously worked for city [employer].    Social Determinants of Health   Financial Resource Strain:   . Difficulty of Paying Living Expenses: Not on file  Food Insecurity:   . Worried About Charity fundraiser in the Last Year: Not on file  . Ran Out of Food in the Last Year: Not on file  Transportation Needs:   . Lack of Transportation (Medical): Not on file  . Lack of Transportation (Non-Medical): Not on file  Physical Activity:   . Days of Exercise per Week: Not on file  . Minutes of Exercise per Session: Not on file  Stress:   . Feeling of Stress : Not on file  Social Connections:   . Frequency of Communication with Friends and Family: Not on file  . Frequency of Social Gatherings with Friends and Family: Not on file  . Attends Religious Services: Not on file  . Active Member of Clubs or Organizations: Not on file  .  Attends Archivist Meetings: Not on file  . Marital Status: Not on file  Intimate Partner Violence:   . Fear of Current or Ex-Partner: Not on file  . Emotionally Abused: Not on file  . Physically Abused: Not on file  . Sexually Abused: Not on file      Family History  Problem Relation Age of Onset  . Heart failure Mother   . Heart failure Brother   . Heart attack Brother     Vitals:   06/05/19 1434  BP: (!) 168/94  Pulse: 91  SpO2: 98%  Weight: 86.1 kg (189 lb 12.8 oz)     PHYSICAL EXAM: General:  Well appearing, thin AAM. No respiratory difficulty HEENT: normal Neck: supple. no JVD. Carotids 2+ bilat; no bruits. No lymphadenopathy or thyromegaly appreciated. Cor: PMI nondisplaced. Regular rate & rhythm. No rubs, gallops or murmurs. Lungs: clear Abdomen: soft, nontender, nondistended. No hepatosplenomegaly. No bruits or masses. Good bowel sounds. Extremities: no cyanosis, clubbing, rash, edema Neuro: alert & oriented x 3, cranial nerves grossly intact. moves all 4 extremities w/o difficulty. Affect pleasant.  ECG: not performed    ASSESSMENT & PLAN:  1. Chronic Systolic HF/ NICM: Diagnosed in 2019. Most recent Echo 11/20 w/ EF 30-35%. [facility] 11/20 showed normal coronary arteries. Mild PAH due to high cardiac output otherwise normal hemodynamics. Denies ETOH use. May be familial. Mother and brother both had CHF. HTN also possible etiology. - NYHA Class II - Volume stable on Exam  - Not on ACE/ARB/ARNI due to h/o Angioedema - Continue Coreg 25 mg bid - Continue spironolactone 25 mg daily - Continue Imdur 30  - Continue hydralazine 25 tid  - Continue Farxiga 10 mg daily  - Continue Torsemide 20 mg daily  - Check BMP today to ensure stable renal function after [place] addition  - may consider cMRI if renal function ok, pending BMP.  2. HTN: elevated at 168/92 (checked x 2 and unchanged). He attributes this to "white coat syndrome". Reports he checks his  BP  at home regularly and it is usually well controlled. He claims he checked it this am and it was 128/83.  - [place] continue current regimen for now - he was advised to bring in home BP monitor to upcomming appt w/ pharmD - If BP remains elevated, plan further titration of hydralazine dose at next visit    3. DM: continue Farxiga - check BMP today   F/u again in 2 weeks for further titration of HF meds.   Lyda Jester, PA-C 06/05/19

## 2019-06-08 ENCOUNTER — Telehealth (HOSPITAL_COMMUNITY): Payer: Self-pay

## 2019-06-08 NOTE — Telephone Encounter (Signed)
-----   Message from Consuelo Pandy, [place] sent at 06/05/2019  3:51 PM EST ----- Bump in SCr w/ Wilder Glade. Discontinue Farxiga and return for f/u BMP in 1 week.

## 2019-06-08 NOTE — Telephone Encounter (Signed)
Left voicemail on machine

## 2019-06-14 ENCOUNTER — Encounter (HOSPITAL_COMMUNITY): Payer: Self-pay

## 2019-06-22 ENCOUNTER — Ambulatory Visit (HOSPITAL_COMMUNITY)
Admission: RE | Admit: 2019-06-22 | Discharge: 2019-06-22 | Disposition: A | Discharge status: Home / Self Care | Payer: Medicare Other | Source: Ambulatory Visit | Attending: Internal Medicine | Admitting: Internal Medicine

## 2019-06-22 ENCOUNTER — Other Ambulatory Visit (HOSPITAL_COMMUNITY): Payer: Self-pay

## 2019-06-22 ENCOUNTER — Other Ambulatory Visit: Payer: Self-pay

## 2019-06-22 DIAGNOSIS — I5022 Chronic systolic (congestive) heart failure: Secondary | ICD-10-CM | POA: Insufficient documentation

## 2019-06-22 LAB — BASIC METABOLIC PANEL
Anion gap: 13 (ref 5–15)
BUN: 46 mg/dL — ABNORMAL HIGH (ref 8–23)
CO2: 25 mmol/L (ref 22–32)
Calcium: 10.2 mg/dL (ref 8.9–10.3)
Chloride: 99 mmol/L (ref 98–111)
Creatinine, Ser: 2.48 mg/dL — ABNORMAL HIGH (ref 0.61–1.24)
GFR calc Af Amer: 30 mL/min — ABNORMAL LOW (ref >60)
GFR calc non Af Amer: 25 mL/min — ABNORMAL LOW (ref >60)
Glucose, Bld: 143 mg/dL — ABNORMAL HIGH (ref 70–99)
Potassium: 3.7 mmol/L (ref 3.5–5.1)
Sodium: 137 mmol/L (ref 135–145)

## 2019-06-26 ENCOUNTER — Telehealth (HOSPITAL_COMMUNITY): Payer: Self-pay

## 2019-06-26 NOTE — Telephone Encounter (Signed)
-----   Message from [place], [place] sent at 06/22/2019  4:20 PM EST ----- Creatine still elevated but no significant change after stopping farxiga. Continue to avoid further use. Refer to nephrology.

## 2019-06-27 ENCOUNTER — Other Ambulatory Visit: Payer: Self-pay | Admitting: Family Medicine

## 2019-06-27 DIAGNOSIS — E1165 Type 2 diabetes mellitus with hyperglycemia: Secondary | ICD-10-CM

## 2019-06-27 MED ORDER — METFORMIN HCL 1000 MG PO TABS: 1000.0000 mg | ORAL_TABLET | Freq: Two times a day (BID) | ORAL | 0 refills | Status: DC

## 2019-07-04 ENCOUNTER — Telehealth (HOSPITAL_COMMUNITY): Payer: Self-pay | Admitting: Vascular Surgery

## 2019-07-04 NOTE — Telephone Encounter (Signed)
Left pt  VM, to reschedule 07/05/19 appt w/ db due to the weather, asked pt to call back to reschedule appt next week w/ APP/NP

## 2019-07-04 NOTE — Telephone Encounter (Signed)
Left pt second message, appt 07/05/19 has been canceled due to inclement weather, asked pt to call back on Friday 2/19 to reschedule

## 2019-07-05 ENCOUNTER — Encounter (HOSPITAL_COMMUNITY): Payer: Medicare Other | Admitting: Internal Medicine

## 2019-07-11 ENCOUNTER — Telehealth (HOSPITAL_COMMUNITY): Payer: Self-pay

## 2019-07-11 NOTE — Telephone Encounter (Signed)
Referral sent to [facility]. Fax confirmation received.  

## 2019-07-15 ENCOUNTER — Other Ambulatory Visit: Payer: Self-pay | Admitting: Family Medicine

## 2019-07-15 ENCOUNTER — Ambulatory Visit: Payer: Medicare Other | Attending: Internal Medicine

## 2019-07-15 DIAGNOSIS — E1165 Type 2 diabetes mellitus with hyperglycemia: Secondary | ICD-10-CM

## 2019-07-15 DIAGNOSIS — Z23 Encounter for immunization: Secondary | ICD-10-CM

## 2019-07-15 NOTE — Progress Notes (Signed)
   [id number] Vaccination Clinic  Name:  [name].    MRN: [id number] DOB: [birth year]  07/15/2019  [name] was observed post Covid-19 immunization for 15 minutes without incidence. He was provided with Vaccine Information Sheet and instruction to access the V-Safe system.   Mr. Freely was instructed to call 911 with any severe reactions post vaccine: Marland Kitchen Difficulty breathing  . Swelling of your face and throat  . A fast heartbeat  . A bad rash all over your body  . Dizziness and weakness    Immunizations Administered    Name Date Dose VIS Date Route   Pfizer COVID-19 Vaccine 07/15/2019  9:12 AM 0.3 mL 04/27/2019 Intramuscular   Manufacturer: [place]   Lot: [id number]   [place]: [id number]

## 2019-08-13 ENCOUNTER — Other Ambulatory Visit: Payer: Self-pay | Admitting: Family Medicine

## 2019-08-13 DIAGNOSIS — E1165 Type 2 diabetes mellitus with hyperglycemia: Secondary | ICD-10-CM

## 2019-08-14 ENCOUNTER — Ambulatory Visit: Payer: Medicare Other | Attending: Internal Medicine

## 2019-08-14 DIAGNOSIS — Z23 Encounter for immunization: Secondary | ICD-10-CM

## 2019-08-14 NOTE — Progress Notes (Signed)
   [id number] Vaccination Clinic  Name:  [name].    MRN: [id number] DOB: [birth year]  08/14/2019  Mr. Maggio was observed post Covid-19 immunization for 15 minutes without incident. He was provided with Vaccine Information Sheet and instruction to access the V-Safe system.   Mr. Azizi was instructed to call 911 with any severe reactions post vaccine: Marland Kitchen Difficulty breathing  . Swelling of face and throat  . A fast heartbeat  . A bad rash all over body  . Dizziness and weakness   Immunizations Administered    Name Date Dose VIS Date Route   Pfizer COVID-19 Vaccine 08/14/2019  9:17 AM 0.3 mL 04/27/2019 Intramuscular   Manufacturer: [place]   Lot: [id number]   [place]: [id number]

## 2019-10-03 ENCOUNTER — Telehealth (HOSPITAL_COMMUNITY): Payer: Self-pay | Admitting: Pharmacy Technician

## 2019-10-03 NOTE — Telephone Encounter (Signed)
Received notification from [employer] that patient's grant for [name] has been terminated for non-use. The grant has never been used. Called to confirm if patient was still taking medication. Had to leave message. [name] attempt to get grant reinstated if patient is still taking medication.  [name] follow up.  Charlann Boxer, CPhT

## 2019-10-16 NOTE — Telephone Encounter (Signed)
Attempted to call patient, left message. Lliam assist with [employer] grant if and when patient calls back.  Charlann Boxer, CPhT

## 2019-11-14 ENCOUNTER — Other Ambulatory Visit: Payer: Self-pay | Admitting: Family Medicine

## 2019-11-14 DIAGNOSIS — E1165 Type 2 diabetes mellitus with hyperglycemia: Secondary | ICD-10-CM

## 2019-11-14 NOTE — Telephone Encounter (Signed)
Patient stopped in needs refill of meds all of them.  The pharmacy that he uses is [facility] [place].  [phone].  Patients phone # [phone].

## 2019-11-16 ENCOUNTER — Other Ambulatory Visit: Payer: Self-pay | Admitting: Family Medicine

## 2019-11-16 ENCOUNTER — Other Ambulatory Visit: Payer: Self-pay | Admitting: Physician Assistant

## 2019-11-16 NOTE — Telephone Encounter (Signed)
Thank you. Partial refills sent to pharmacy.

## 2019-11-16 NOTE — Telephone Encounter (Signed)
This is a CHF pt now. Please address

## 2019-11-16 NOTE — Telephone Encounter (Signed)
Reason for refill refusal: This patient needs to be seen. He has not been seen by [place] since 04/20/2019 and last cardiology appointment was January of this year and was asked to follow up with them in 1 month. He has not followed up with cardiology. Thank you.

## 2019-11-16 NOTE — Telephone Encounter (Signed)
LMOVM for callback. Sigmund Morera, CMA  

## 2019-11-16 NOTE — Telephone Encounter (Signed)
Called patient and scheduled with Dr. Tarry Kos (PCP- no availability) for 11/29/2019. Patient asking if he can receive partial refills to last him until scheduled appointment. Advised patient to also call cardiology and schedule f/u.   To PCP  Talbot Grumbling, RN

## 2019-11-29 ENCOUNTER — Other Ambulatory Visit: Payer: Self-pay

## 2019-11-29 ENCOUNTER — Encounter: Payer: Self-pay | Admitting: Family Medicine

## 2019-11-29 ENCOUNTER — Ambulatory Visit (INDEPENDENT_AMBULATORY_CARE_PROVIDER_SITE_OTHER): Payer: Medicare Other | Admitting: Family Medicine

## 2019-11-29 VITALS — BP 124/80 | HR 90 | Wt 200.0 lb

## 2019-11-29 DIAGNOSIS — I10 Essential (primary) hypertension: Secondary | ICD-10-CM

## 2019-11-29 DIAGNOSIS — E1165 Type 2 diabetes mellitus with hyperglycemia: Secondary | ICD-10-CM

## 2019-11-29 DIAGNOSIS — E119 Type 2 diabetes mellitus without complications: Secondary | ICD-10-CM

## 2019-11-29 DIAGNOSIS — Z23 Encounter for immunization: Secondary | ICD-10-CM | POA: Diagnosis not present

## 2019-11-29 DIAGNOSIS — N1832 Chronic kidney disease, stage 3b: Secondary | ICD-10-CM

## 2019-11-29 DIAGNOSIS — I5020 Unspecified systolic (congestive) heart failure: Secondary | ICD-10-CM

## 2019-11-29 NOTE — Progress Notes (Signed)
Subjective:   Patient ID: [name].    DOB: [birth year], 69 y.o. male   MRN: [id number]  [name]. is a 69 y.o. male with a history of HFrEF 2/2 NICM, HTN, uncontrolled diabetes, hyponatremia, stage III CKD, h/o poor compliance due to poor financial situation here for medication refill.   Diabetes: Last three A1C's below. Currently on Metformin 1000mg  BID. Endorses compliance. Denies any polyuria, polydipsia, polyphagia. Due for diabetic foot exam, diabetic eye exam, PNA vaccine.  Patient notes that he has been working out daily including weight training, bands, leg lifts and cardio.  Lab Results  Component Value Date   HGBA1C 7.4 (H) 04/13/2019   HGBA1C (H) 02/18/2007    10.5 (NOTE)   The ADA recommends the following therapeutic goals for glycemic   control related to Hgb A1C measurement:   Goal of Therapy:   < 7.0% Hgb A1C   Action Suggested:  > 8.0% Hgb A1C   Ref:  Diabetes Care, 22, Suppl. 1, 1999    HTN:  BP: 124/80 today. Currently on Coreg 25mg  BID, Imdur 30mg  QD, Hydralazine 25mg  TID. Endorses compliance. Non-smoker. Denies any chest pain, SOB, vision changes, or headaches. H/o angioedema with ACE.   HLD: Last lipid panel below. Currently on DHA PO. Endorses compliance. Denies any muscles aches or weakness.  Lab Results  Component Value Date   CHOL 116 04/15/2019   HDL 43 04/15/2019   LDLCALC 62 04/15/2019   TRIG 53 04/15/2019   CHOLHDL 2.7 04/15/2019   HFrEF: Diagnosed in 2019. EF of 30-35%. R/L HC in 2020 with normal coronaries c/w NICM and mild PAH due to high cardiac output, otherwise normal hemodynamics. Not currently on ACE/ARB/ARNI due to h/o angioedema. Home meds include Coreg 25mg  BID, Imdur 30mg  QD, Hydralazine 25mg  TID, and Torsemide 20mg  QD, and Farxiga 10mg  QD. He was discontinued from [place] and Spironolactone. Denies any difficulty breathing and or LE swelling.   Review of Systems:  Per HPI.   Objective:   BP 124/80   Pulse 90   Wt  200 lb (90.7 kg)   SpO2 99%   BMI 29.53 kg/m  Vitals and nursing note reviewed.  General: thin pleasant older male, sitting comfortably in exam chair, well nourished, well developed, in no acute distress with non-toxic appearance CV: regular rate and rhythm without murmurs, rubs, or gallops, no lower extremity edema Lungs: clear to auscultation bilaterally with normal work of breathing on RA Abdomen: soft, non-tender, non-distended Skin: warm, dry Extremities: warm and well perfused, normal tone MSK:  gait normal Neuro: Alert and oriented, speech normal  Diabetic foot exam was performed with the following findings:   No deformities, ulcerations, or other skin breakdown Intact posterior tibialis and dorsalis pedis pulses Mildly decreased sensation along dorsum of feet bilaterally  Fungus on all toenails bilaterally    Assessment & Plan:   Essential hypertension Chronic, currently well controlled. Nonsmoker.  Continue Coreg 25mg  BID, Imdur 30mg  QD, Hydralazine 25mg  TID  Diabetes mellitus without complication (HCC) Chronic. Doing well on Metformin 1000mg  BID. Nonsmoker. Not currently on statin. Congratulated patient on life style modifications. -Follow up A1C, lipid panel - PNA vaccine given today - Diabetic foot exam performed today which showed mild decrease sensory along dorsum of feet bilaterally. Patient counseled on these findings and recommend daily feet evaluation and return precautions.  - Patient instructed to schedule diabetic eye exam at earliest convenience. Referral placed. - Recommend discussing starting statin therapy in setting of T2DM  at follow up visit - continue daily exercise  - follow up 3 months with PCP  HFrEF (heart failure with reduced ejection fraction) ([place]) Euvolemic on exam today. Tolerating medications well.  - Continue current medications as prescribed  - follow up with heart failure clinic as scheduled - follow up if worsening SOB, LE swelling,  >3lb weight gain  Orders Placed This Encounter  Procedures  . Pneumococcal polysaccharide vaccine 23-valent greater than or equal to 2yo subcutaneous/IM  . Basic Metabolic Panel    Standing Status:   Future    Standing Expiration Date:   12/02/2020  . Lipid Panel    Standing Status:   Future    Standing Expiration Date:   12/02/2020  . Ambulatory referral to Ophthalmology    Referral Priority:   Routine    Referral Type:   Consultation    Referral Reason:   Specialty Services Required    Requested Specialty:   Ophthalmology    Number of Visits Requested:   1  . POCT glycosylated hemoglobin (Hb A1C)    Standing Status:   Future    Standing Expiration Date:   03/04/2020   No orders of the defined types were placed in this encounter.  Mina Marble, DO PGY-3, [facility] 12/03/2019 9:23 PM

## 2019-11-29 NOTE — Patient Instructions (Addendum)
Thank you for coming to see me today. It was a pleasure to see you.   Please continue your current medications as prescribed.  Please notify your PCP if you need refills. Please call your cardiologist to schedule a follow-up appointment at your earliest convenience. Please be sure to schedule your diabetic eye exam at your earliest convenience.  We are checking some labs today, I Cedrick call you if they are abnormal [place] send you a MyChart message or a letter if they are normal.  If you do not hear about your labs in the next 2 weeks please let us know.  Please follow-up with PCP in 3 months for diabetes follow-up  If you have any questions or concerns, please do not hesitate to call the office at [phone].  Take Care,  Dr. Mina Marble, DO Resident Physician [place] [phone]

## 2019-12-03 NOTE — Assessment & Plan Note (Addendum)
Chronic, currently well controlled. Nonsmoker.  Continue Coreg 25mg  BID, Imdur 30mg  QD, Hydralazine 25mg  TID

## 2019-12-03 NOTE — Assessment & Plan Note (Addendum)
Chronic. Doing well on Metformin 1000mg  BID. Nonsmoker. Not currently on statin. Congratulated patient on life style modifications. -Follow up A1C, lipid panel - PNA vaccine given today - Diabetic foot exam performed today which showed mild decrease sensory along dorsum of feet bilaterally. Patient counseled on these findings and recommend daily feet evaluation and return precautions.  - Patient instructed to schedule diabetic eye exam at earliest convenience. Referral placed. - Recommend discussing starting statin therapy in setting of T2DM at follow up visit - continue daily exercise  - follow up 3 months with PCP

## 2019-12-03 NOTE — Assessment & Plan Note (Deleted)
Chronic. Doing well on Metformin 1000mg  BID.  -Follow up A1C - PNA vaccine given today - Diabetic foot exam performed today which showed mild decrease sensory along dorsum of feet bilaterally. Patient counseled on these findings and recommend daily feet evaluation and return precautions.  - Patient instructed to schedule diabetic eye exam at earliest convenience. Referral placed.

## 2019-12-03 NOTE — Assessment & Plan Note (Signed)
Euvolemic on exam today. Tolerating medications well.  - Continue current medications as prescribed  - follow up with heart failure clinic as scheduled - follow up if worsening SOB, LE swelling, >3lb weight gain

## 2019-12-06 ENCOUNTER — Telehealth: Payer: Self-pay

## 2019-12-06 NOTE — Telephone Encounter (Signed)
LVM informing patient to call [facility] to schedule appointment.  Ozella Almond, [place]

## 2019-12-06 NOTE — Telephone Encounter (Signed)
-----   Message from Danna Hefty, [place] sent at 12/03/2019  9:12 PM EDT ----- Please have this patient schedule a lab appointment to have labs done. Appears the labs ordered on the day of his appointment were never collected. Thank you.

## 2019-12-07 ENCOUNTER — Other Ambulatory Visit: Payer: Self-pay

## 2019-12-07 ENCOUNTER — Other Ambulatory Visit (INDEPENDENT_AMBULATORY_CARE_PROVIDER_SITE_OTHER): Payer: Medicare Other

## 2019-12-07 DIAGNOSIS — I5020 Unspecified systolic (congestive) heart failure: Secondary | ICD-10-CM | POA: Diagnosis not present

## 2019-12-07 DIAGNOSIS — E1165 Type 2 diabetes mellitus with hyperglycemia: Secondary | ICD-10-CM | POA: Diagnosis not present

## 2019-12-07 DIAGNOSIS — I10 Essential (primary) hypertension: Secondary | ICD-10-CM | POA: Diagnosis not present

## 2019-12-07 DIAGNOSIS — N1832 Chronic kidney disease, stage 3b: Secondary | ICD-10-CM

## 2019-12-07 LAB — POCT GLYCOSYLATED HEMOGLOBIN (HGB A1C): HbA1c, POC (controlled diabetic range): 7 % (ref 0.0–7.0)

## 2019-12-07 MED ORDER — TORSEMIDE 20 MG PO TABS: 40.0000 mg | ORAL_TABLET | Freq: Every day | ORAL | 0 refills | Status: DC

## 2019-12-07 MED ORDER — ISOSORBIDE MONONITRATE ER 30 MG PO TB24: 30.0000 mg | ORAL_TABLET | Freq: Every day | ORAL | 0 refills | Status: DC

## 2019-12-07 MED ORDER — HYDRALAZINE HCL 25 MG PO TABS: 25.0000 mg | ORAL_TABLET | Freq: Three times a day (TID) | ORAL | 0 refills | Status: DC

## 2019-12-08 LAB — LIPID PANEL
Chol/HDL Ratio: 4.2 ratio (ref 0.0–5.0)
Cholesterol, Total: 167 mg/dL (ref 100–199)
HDL: 40 mg/dL (ref >39)
LDL Chol Calc (NIH): 109 mg/dL — ABNORMAL HIGH (ref 0–99)
Triglycerides: 99 mg/dL (ref 0–149)
VLDL Cholesterol Cal: 18 mg/dL (ref 5–40)

## 2019-12-08 LAB — BASIC METABOLIC PANEL
BUN/Creatinine Ratio: 13 (ref 10–24)
BUN: 40 mg/dL — ABNORMAL HIGH (ref 8–27)
CO2: 23 mmol/L (ref 20–29)
Calcium: 9.8 mg/dL (ref 8.6–10.2)
Chloride: 98 mmol/L (ref 96–106)
Creatinine, Ser: 3.06 mg/dL — ABNORMAL HIGH (ref 0.76–1.27)
GFR calc Af Amer: 23 mL/min/{1.73_m2} — ABNORMAL LOW (ref >59)
GFR calc non Af Amer: 20 mL/min/{1.73_m2} — ABNORMAL LOW (ref >59)
Glucose: 165 mg/dL — ABNORMAL HIGH (ref 65–99)
Potassium: 3.9 mmol/L (ref 3.5–5.2)
Sodium: 137 mmol/L (ref 134–144)

## 2019-12-10 ENCOUNTER — Other Ambulatory Visit: Payer: Self-pay | Admitting: Family Medicine

## 2019-12-13 ENCOUNTER — Other Ambulatory Visit: Payer: Self-pay | Admitting: Family Medicine

## 2019-12-13 DIAGNOSIS — N1832 Chronic kidney disease, stage 3b: Secondary | ICD-10-CM

## 2019-12-20 ENCOUNTER — Other Ambulatory Visit: Payer: Medicare Other

## 2019-12-20 ENCOUNTER — Other Ambulatory Visit: Payer: Self-pay

## 2019-12-20 DIAGNOSIS — N1832 Chronic kidney disease, stage 3b: Secondary | ICD-10-CM | POA: Diagnosis not present

## 2019-12-21 LAB — RENAL FUNCTION PANEL
Albumin: 4.2 g/dL (ref 3.8–4.8)
BUN/Creatinine Ratio: 15 (ref 10–24)
BUN: 46 mg/dL — ABNORMAL HIGH (ref 8–27)
CO2: 21 mmol/L (ref 20–29)
Calcium: 8.7 mg/dL (ref 8.6–10.2)
Chloride: 100 mmol/L (ref 96–106)
Creatinine, Ser: 2.97 mg/dL — ABNORMAL HIGH (ref 0.76–1.27)
GFR calc Af Amer: 24 mL/min/{1.73_m2} — ABNORMAL LOW (ref >59)
GFR calc non Af Amer: 21 mL/min/{1.73_m2} — ABNORMAL LOW (ref >59)
Glucose: 216 mg/dL — ABNORMAL HIGH (ref 65–99)
Phosphorus: 3.2 mg/dL (ref 2.8–4.1)
Potassium: 4.1 mmol/L (ref 3.5–5.2)
Sodium: 137 mmol/L (ref 134–144)

## 2019-12-27 ENCOUNTER — Other Ambulatory Visit: Payer: Self-pay | Admitting: Family Medicine

## 2019-12-27 DIAGNOSIS — N289 Disorder of kidney and ureter, unspecified: Secondary | ICD-10-CM

## 2020-01-04 ENCOUNTER — Other Ambulatory Visit: Payer: Self-pay

## 2020-01-04 ENCOUNTER — Other Ambulatory Visit: Payer: Medicare Other

## 2020-01-04 DIAGNOSIS — N289 Disorder of kidney and ureter, unspecified: Secondary | ICD-10-CM | POA: Diagnosis not present

## 2020-01-05 LAB — RENAL FUNCTION PANEL
Albumin: 4.2 g/dL (ref 3.8–4.8)
BUN/Creatinine Ratio: 14 (ref 10–24)
BUN: 42 mg/dL — ABNORMAL HIGH (ref 8–27)
CO2: 22 mmol/L (ref 20–29)
Calcium: 9.3 mg/dL (ref 8.6–10.2)
Chloride: 100 mmol/L (ref 96–106)
Creatinine, Ser: 2.94 mg/dL — ABNORMAL HIGH (ref 0.76–1.27)
GFR calc Af Amer: 24 mL/min/{1.73_m2} — ABNORMAL LOW (ref >59)
GFR calc non Af Amer: 21 mL/min/{1.73_m2} — ABNORMAL LOW (ref >59)
Glucose: 154 mg/dL — ABNORMAL HIGH (ref 65–99)
Phosphorus: 3.6 mg/dL (ref 2.8–4.1)
Potassium: 4.4 mmol/L (ref 3.5–5.2)
Sodium: 139 mmol/L (ref 134–144)

## 2020-01-08 ENCOUNTER — Other Ambulatory Visit: Payer: Self-pay | Admitting: Family Medicine

## 2020-01-08 DIAGNOSIS — N184 Chronic kidney disease, stage 4 (severe): Secondary | ICD-10-CM | POA: Insufficient documentation

## 2020-02-08 ENCOUNTER — Other Ambulatory Visit: Payer: Self-pay

## 2020-02-08 ENCOUNTER — Other Ambulatory Visit: Payer: Medicare Other

## 2020-02-08 DIAGNOSIS — N184 Chronic kidney disease, stage 4 (severe): Secondary | ICD-10-CM

## 2020-02-09 LAB — BASIC METABOLIC PANEL
BUN/Creatinine Ratio: 13 (ref 10–24)
BUN: 38 mg/dL — ABNORMAL HIGH (ref 8–27)
CO2: 23 mmol/L (ref 20–29)
Calcium: 8.8 mg/dL (ref 8.6–10.2)
Chloride: 99 mmol/L (ref 96–106)
Creatinine, Ser: 3.01 mg/dL — ABNORMAL HIGH (ref 0.76–1.27)
GFR calc Af Amer: 23 mL/min/{1.73_m2} — ABNORMAL LOW (ref >59)
GFR calc non Af Amer: 20 mL/min/{1.73_m2} — ABNORMAL LOW (ref >59)
Glucose: 204 mg/dL — ABNORMAL HIGH (ref 65–99)
Potassium: 4.5 mmol/L (ref 3.5–5.2)
Sodium: 135 mmol/L (ref 134–144)

## 2020-03-11 ENCOUNTER — Ambulatory Visit: Payer: Medicare Other | Admitting: Family Medicine

## 2020-04-18 ENCOUNTER — Other Ambulatory Visit: Payer: Self-pay | Admitting: Family Medicine

## 2020-04-18 DIAGNOSIS — E1165 Type 2 diabetes mellitus with hyperglycemia: Secondary | ICD-10-CM

## 2020-08-18 ENCOUNTER — Other Ambulatory Visit: Payer: Self-pay | Admitting: Family Medicine

## 2020-08-18 DIAGNOSIS — E1165 Type 2 diabetes mellitus with hyperglycemia: Secondary | ICD-10-CM

## 2020-08-18 NOTE — Telephone Encounter (Signed)
Called patient and LVM for patient to schedule an appointment for further refills.  Ozella Almond, [place]

## 2020-09-30 ENCOUNTER — Encounter (HOSPITAL_COMMUNITY): Payer: Self-pay | Admitting: Emergency Medicine

## 2020-09-30 ENCOUNTER — Other Ambulatory Visit: Payer: Self-pay

## 2020-09-30 ENCOUNTER — Other Ambulatory Visit: Payer: Self-pay | Admitting: Family Medicine

## 2020-09-30 ENCOUNTER — Emergency Department (HOSPITAL_COMMUNITY)
Admission: EM | Admit: 2020-09-30 | Discharge: 2020-10-01 | Disposition: A | Discharge status: Home / Self Care | Payer: Medicare (Managed Care) | Attending: Emergency Medicine | Admitting: Emergency Medicine

## 2020-09-30 ENCOUNTER — Emergency Department (HOSPITAL_COMMUNITY): Payer: Medicare (Managed Care)

## 2020-09-30 DIAGNOSIS — I509 Heart failure, unspecified: Secondary | ICD-10-CM

## 2020-09-30 DIAGNOSIS — Z7984 Long term (current) use of oral hypoglycemic drugs: Secondary | ICD-10-CM | POA: Diagnosis not present

## 2020-09-30 DIAGNOSIS — E1165 Type 2 diabetes mellitus with hyperglycemia: Secondary | ICD-10-CM

## 2020-09-30 DIAGNOSIS — N184 Chronic kidney disease, stage 4 (severe): Secondary | ICD-10-CM | POA: Insufficient documentation

## 2020-09-30 DIAGNOSIS — Z79899 Other long term (current) drug therapy: Secondary | ICD-10-CM | POA: Diagnosis not present

## 2020-09-30 DIAGNOSIS — E1122 Type 2 diabetes mellitus with diabetic chronic kidney disease: Secondary | ICD-10-CM | POA: Diagnosis not present

## 2020-09-30 DIAGNOSIS — I130 Hypertensive heart and chronic kidney disease with heart failure and stage 1 through stage 4 chronic kidney disease, or unspecified chronic kidney disease: Secondary | ICD-10-CM | POA: Diagnosis not present

## 2020-09-30 LAB — COMPREHENSIVE METABOLIC PANEL
ALT: 26 U/L (ref 0–44)
AST: 29 U/L (ref 15–41)
Albumin: 3 g/dL — ABNORMAL LOW (ref 3.5–5.0)
Alkaline Phosphatase: 96 U/L (ref 38–126)
Anion gap: 9 (ref 5–15)
BUN: 36 mg/dL — ABNORMAL HIGH (ref 8–23)
CO2: 22 mmol/L (ref 22–32)
Calcium: 8.5 mg/dL — ABNORMAL LOW (ref 8.9–10.3)
Chloride: 105 mmol/L (ref 98–111)
Creatinine, Ser: 2.79 mg/dL — ABNORMAL HIGH (ref 0.61–1.24)
GFR, Estimated: 24 mL/min — ABNORMAL LOW (ref >60)
Glucose, Bld: 128 mg/dL — ABNORMAL HIGH (ref 70–99)
Potassium: 4.4 mmol/L (ref 3.5–5.1)
Sodium: 136 mmol/L (ref 135–145)
Total Bilirubin: 0.6 mg/dL (ref 0.3–1.2)
Total Protein: 6.9 g/dL (ref 6.5–8.1)

## 2020-09-30 LAB — CBC
HCT: 26.9 % — ABNORMAL LOW (ref 39.0–52.0)
Hemoglobin: 8.3 g/dL — ABNORMAL LOW (ref 13.0–17.0)
MCH: 28.2 pg (ref 26.0–34.0)
MCHC: 30.9 g/dL (ref 30.0–36.0)
MCV: 91.5 fL (ref 80.0–100.0)
Platelets: 195 10*3/uL (ref 150–400)
RBC: 2.94 MIL/uL — ABNORMAL LOW (ref 4.22–5.81)
RDW: 15.6 % — ABNORMAL HIGH (ref 11.5–15.5)
WBC: 4.9 10*3/uL (ref 4.0–10.5)
nRBC: 0 % (ref 0.0–0.2)

## 2020-09-30 LAB — TROPONIN I (HIGH SENSITIVITY)
Troponin I (High Sensitivity): 23 ng/L — ABNORMAL HIGH (ref <18)
Troponin I (High Sensitivity): 20 ng/L — ABNORMAL HIGH (ref <18)

## 2020-09-30 LAB — BRAIN NATRIURETIC PEPTIDE: B Natriuretic Peptide: 3879.8 pg/mL — ABNORMAL HIGH (ref 0.0–100.0)
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
## 2020-10-01 ENCOUNTER — Ambulatory Visit: Payer: Medicare Other | Admitting: Family Medicine

## 2020-10-01 MED ORDER — FUROSEMIDE 10 MG/ML IJ SOLN
120.0000 mg | Freq: Once | INTRAVENOUS | Status: AC
  Administered 2020-10-01: 120 mg via INTRAVENOUS
  Filled 2020-10-01: qty 10

## 2020-10-01 MED ORDER — HYDRALAZINE HCL 25 MG PO TABS: 25.0000 mg | ORAL_TABLET | Freq: Three times a day (TID) | ORAL | 2 refills | Status: DC

## 2020-10-01 MED ORDER — ISOSORBIDE MONONITRATE ER 30 MG PO TB24: 30.0000 mg | ORAL_TABLET | Freq: Every day | ORAL | 2 refills | Status: DC

## 2020-10-01 MED ORDER — TORSEMIDE 20 MG PO TABS: 40.0000 mg | ORAL_TABLET | Freq: Every day | ORAL | 2 refills | Status: DC

## 2020-10-01 NOTE — ED Provider Notes (Signed)
[facility] EMERGENCY DEPARTMENT Provider Note   CSN: [id number] Arrival date & time: 09/30/20  1540     History Chief Complaint  Patient presents with  . Congestive Heart Failure  . Foot Burn    [name]. is a 70 y.o. male.  Been out of medications for the last few weeks to include Imdur and torsemide.  Has had progressively worsening lower extreme edema and scrotal edema.  No shortness of breath or chest pain.  No syncope.  No other associated issues.         Past Medical History:  Diagnosis Date  . Diabetes mellitus without complication ([place])   . Heart failure with reduced ejection fraction ([place])   . Hypertension     Patient Active Problem List   Diagnosis Date Noted  . Chronic kidney disease, stage 4 (severe) ([place]) 01/08/2020  . Diabetes mellitus without complication ([place]) [id number]  . Hyponatremia 04/14/2019  . Essential hypertension 04/14/2019  . Renal insufficiency 04/14/2019  . NSVT (nonsustained ventricular tachycardia) ([place]) 04/14/2019  . HFrEF (heart failure with reduced ejection fraction) ([place]) 04/12/2019    Past Surgical History:  Procedure Laterality Date  . RIGHT/LEFT HEART CATH AND CORONARY ANGIOGRAPHY N/A 04/16/2019   Procedure: RIGHT/LEFT HEART CATH AND CORONARY ANGIOGRAPHY;  Surgeon: [name], MD;  Location: [facility];  Service: Cardiovascular;  Laterality: N/A;       Family History  Problem Relation Age of Onset  . Heart failure Mother   . Heart failure Brother   . Heart attack Brother     Social History   Tobacco Use  . Smoking status: Never Smoker  . Smokeless tobacco: Never Used  Substance Use Topics  . Alcohol use: No  . Drug use: No    Home Medications Prior to Admission medications   Medication Sig Start Date End Date Taking? Authorizing Provider  Alfalfa 500 MG TABS Take 1,000 mg by mouth daily.   Yes [provider]  Ascorbic Acid (VITAMIN C PO) Take 1 tablet by  mouth daily.   Yes [provider]  ASHWAGANDHA PO Take 1 tablet by mouth daily.   Yes [provider]  b complex vitamins tablet Take 1 tablet by mouth daily.   Yes [provider]  Biotin 1000 MCG tablet Take 1,000 mcg by mouth daily.   Yes [provider]  carvedilol (COREG) 25 MG tablet TAKE 1 TABLET(25 MG) BY MOUTH TWICE DAILY Patient taking differently: Take 25 mg by mouth 2 (two) times daily with a meal. 11/16/19  Yes Simmons, Brittainy M, PA-C  Cinnamon 500 MG capsule Take 1,000 mg by mouth daily.   Yes [provider]  Docosahexaenoic Acid (DHA PO) Take 1 capsule by mouth daily.   Yes [provider]  Ginkgo Biloba 40 MG TABS Take 40 mg by mouth at bedtime.   Yes [provider]  hydrALAZINE (APRESOLINE) 25 MG tablet Take 1 tablet (25 mg total) by mouth 3 (three) times daily. 10/01/20 12/30/20 Yes Kimberl Vig, Corene Cornea, MD  isosorbide mononitrate (IMDUR) 30 MG 24 hr tablet Take 1 tablet (30 mg total) by mouth daily. 10/01/20 12/30/20 Yes Reyna Lorenzi, Corene Cornea, MD  metFORMIN (GLUCOPHAGE) 1000 MG tablet TAKE 1 TABLET(1000 MG) BY MOUTH TWICE DAILY WITH A MEAL Patient taking differently: Take 1,000 mg by mouth 2 (two) times daily with a meal. 08/18/20  Yes Carollee Leitz, MD  Tetrahydrozoline HCl (VISINE OP) Place 1 drop into both eyes daily as needed (dry  eyes).   Yes [provider]  torsemide (DEMADEX) 20 MG tablet Take 2 tablets (40 mg total) by mouth daily. 10/01/20 12/30/20 Yes Richardo Popoff, Corene Cornea, MD  TURMERIC PO Take 1 capsule by mouth daily.   Yes [provider]  VITAMIN D PO Take 1 capsule by mouth daily.   Yes [provider]  Zinc 50 MG TABS Take 50 mg by mouth daily.   Yes [provider]    Allergies    Lisinopril and Other  Review of Systems   Review of Systems  All other systems reviewed and are negative.   Physical Exam Updated Vital Signs BP (!) 148/81   Pulse 87   Temp 98.2 F (36.8 C)   Resp  (!) 26   Ht '5\' 9"'$  (1.753 m)   Wt 72.6 kg   SpO2 98%   BMI 23.63 kg/m   Physical Exam Vitals and nursing note reviewed.  Constitutional:      Appearance: He is well-developed.  HENT:     Head: Normocephalic and atraumatic.     Mouth/Throat:     Mouth: Mucous membranes are moist.     Pharynx: Oropharynx is clear.  Eyes:     Pupils: Pupils are equal, round, and reactive to light.  Cardiovascular:     Rate and Rhythm: Normal rate.  Pulmonary:     Effort: Pulmonary effort is normal. No respiratory distress.  Abdominal:     General: There is no distension.  Musculoskeletal:        General: Normal range of motion.     Cervical back: Normal range of motion.     Right lower leg: Edema present.     Left lower leg: Edema present.  Skin:    General: Skin is warm and dry.  Neurological:     General: No focal deficit present.     Mental Status: He is alert.     ED Results / Procedures / Treatments   Labs (all labs ordered are listed, but only abnormal results are displayed) Labs Reviewed  CBC - Abnormal; Notable for the following components:      Result Value   RBC 2.94 (*)    Hemoglobin 8.3 (*)    HCT 26.9 (*)    RDW 15.6 (*)    All other components within normal limits  COMPREHENSIVE METABOLIC PANEL - Abnormal; Notable for the following components:   Glucose, Bld 128 (*)    BUN 36 (*)    Creatinine, Ser 2.79 (*)    Calcium 8.5 (*)    Albumin 3.0 (*)    GFR, Estimated 24 (*)    All other components within normal limits  BRAIN NATRIURETIC PEPTIDE - Abnormal; Notable for the following components:   B Natriuretic Peptide 3,879.8 (*)    All other components within normal limits  TROPONIN I (HIGH SENSITIVITY) - Abnormal; Notable for the following components:   Troponin I (High Sensitivity) 23 (*)    All other components within normal limits  TROPONIN I (HIGH SENSITIVITY) - Abnormal; Notable for the following components:   Troponin I (High Sensitivity) 20 (*)    All other  components within normal limits    EKG None  Radiology DG Chest 2 View  Result Date: 09/30/2020 CLINICAL DATA:  Bilateral lower extremity pitting edema. EXAM: CHEST - 2 VIEW COMPARISON:  April 12, 2019 FINDINGS: There is no evidence of acute infiltrate, pleural effusion or pneumothorax. Persistent prominence of the right perihilar pulmonary vasculature is noted.  The cardiac silhouette is borderline in size. The visualized skeletal structures are unremarkable. IMPRESSION: Stable exam without active cardiopulmonary disease. Electronically Signed   By: Virgina Norfolk M.D.   On: 09/30/2020 17:58    Procedures Procedures   Medications Ordered in ED Medications  furosemide (LASIX) 120 mg in dextrose 5 % 50 mL IVPB (0 mg Intravenous Stopped 10/01/20 0334)    ED Course  I have reviewed the triage vital signs and the nursing notes.  Pertinent labs & imaging results that were available during my care of the patient were reviewed by me and considered in my medical decision making (see chart for details).    MDM Rules/Calculators/A&P                          Patient with significant amount of diuresis here.  I discussed with him admission versus discharge he prefers to be discharged at this time.  I feel that is appropriate he is slightly tachypneic but is not hypoxic or any kind of distress.  His lungs overall sound pretty clear.  His BNP is pretty high however once again he has no respiratory issues at this time.  He is not anasarca.  I discussed with the family medicine residents and they Omarius work on getting appointment for follow-up in the next week.  Medication refills provided.  Return precautions provided.  Final Clinical Impression(s) / ED Diagnoses Final diagnoses:  Acute on chronic congestive heart failure, unspecified heart failure type [facility])    Rx / DC Orders ED Discharge Orders         Ordered    torsemide (DEMADEX) 20 MG tablet  Daily        10/01/20 0542     hydrALAZINE (APRESOLINE) 25 MG tablet  3 times daily        10/01/20 0542    isosorbide mononitrate (IMDUR) 30 MG 24 hr tablet  Daily        10/01/20 0542           Kanaya Gunnarson, Corene Cornea, MD 10/01/20 0600

## 2020-10-06 ENCOUNTER — Telehealth: Payer: Self-pay

## 2020-10-06 ENCOUNTER — Encounter: Payer: Self-pay | Admitting: Family Medicine

## 2020-10-06 ENCOUNTER — Ambulatory Visit (INDEPENDENT_AMBULATORY_CARE_PROVIDER_SITE_OTHER): Payer: Medicare (Managed Care) | Admitting: Family Medicine

## 2020-10-06 ENCOUNTER — Other Ambulatory Visit: Payer: Self-pay

## 2020-10-06 VITALS — BP 159/90 | HR 93 | Wt 217.2 lb

## 2020-10-06 DIAGNOSIS — N184 Chronic kidney disease, stage 4 (severe): Secondary | ICD-10-CM | POA: Diagnosis not present

## 2020-10-06 DIAGNOSIS — S99921A Unspecified injury of right foot, initial encounter: Secondary | ICD-10-CM | POA: Diagnosis not present

## 2020-10-06 DIAGNOSIS — D649 Anemia, unspecified: Secondary | ICD-10-CM | POA: Insufficient documentation

## 2020-10-06 DIAGNOSIS — I5020 Unspecified systolic (congestive) heart failure: Secondary | ICD-10-CM | POA: Diagnosis not present

## 2020-10-06 DIAGNOSIS — S99929A Unspecified injury of unspecified foot, initial encounter: Secondary | ICD-10-CM | POA: Insufficient documentation

## 2020-10-06 DIAGNOSIS — E1165 Type 2 diabetes mellitus with hyperglycemia: Secondary | ICD-10-CM

## 2020-10-06 MED ORDER — METFORMIN HCL 1000 MG PO TABS: 1000.0000 mg | ORAL_TABLET | Freq: Two times a day (BID) | ORAL | 2 refills | Status: DC

## 2020-10-06 MED ORDER — TORSEMIDE 20 MG PO TABS: 40.0000 mg | ORAL_TABLET | Freq: Two times a day (BID) | ORAL | 2 refills | Status: DC

## 2020-10-06 NOTE — Addendum Note (Signed)
Addended by: Grant Ruts on: 10/06/2020 05:45 PM   Modules accepted: Orders

## 2020-10-06 NOTE — Telephone Encounter (Signed)
This [name] see you this afternoon. Please assess if metformin is still appropriate for this patient. Request may be inappropriate due to Cr clearance.

## 2020-10-06 NOTE — Telephone Encounter (Signed)
Patient calls nurse line to reschedule hospital follow up appointment. Patient reports increased swelling "all over body" and having increased pain in legs. Patient denies shortness of breath or chest pain.  Patient is requesting to see provider today. Rescheduled patient to see Dr. Pilar Plate at 3:30 this afternoon.   ED precautions given.   Talbot Grumbling, RN
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
## 2020-10-06 NOTE — Telephone Encounter (Signed)
Thank you for your help with this patient.   Gove, DO 10/06/2020, 2:51 PM PGY-2, [place]

## 2020-10-06 NOTE — Assessment & Plan Note (Signed)
Unclear etiology.  Likely due to iron deficiency from colonic blood loss until proven otherwise. -Follow-up iron studies -[place] likely need referral for colonoscopy
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
## 2020-10-06 NOTE — Assessment & Plan Note (Addendum)
His scrotal swelling and lower extremity edema in addition to his rales on lung exam and elevated JVD are all consistent with an acute decompensated heart failure.  He does not have any oxygen requirement at this time.  Although he is not hypoxic, there may have been some benefit to diuresing him in the inpatient setting to closely monitor his renal function.  We [place] make every effort to diurese him in the outpatient setting in a safe manner.  I do think that he needs to be seen in clinic twice a week until he shows significant improvement in his volume status.  We reviewed his medication and he reports that he has all the medication he needs right now. -Increase torsemide to 40 mg twice daily -Follow-up kidney function -Return to clinic in 2 days for follow-up assessment of volume status and repeat labs to assess kidney function -Echo complete ordered -CMA's to schedule echo, he can be given his ultrasound appointment at the next visit

## 2020-10-06 NOTE — Patient Instructions (Signed)
Congestive heart failure: The reason they have all this fluid building up in your legs and your scrotum is that you experiencing an episode of heart failure.  This can improve with appropriate medications.  I would like you to increase your torsemide to twice a day.  Take 1 dose of torsemide early in the morning and your second dose around 2 PM in the afternoon.  This Lonny make you pee much more frequently but it can be difficult for your kidneys.  Jaqwan be important for you to come back to clinic in 2 days on Wednesday so that we can take a look at you and check your kidneys again.  We are going to get some additional lab work today to follow-up on your kidney function and some other labs that were taken the emergency room.  Chronic kidney disease: I have placed a referral to nephrology you should get a call in the next 1-2 weeks to set up your appointment.  Please let me know if you have not received a call in the next 2 weeks.

## 2020-10-07 ENCOUNTER — Ambulatory Visit: Payer: Medicare (Managed Care) | Admitting: Family Medicine

## 2020-10-07 LAB — CBC WITH DIFFERENTIAL/PLATELET
Basophils Absolute: 0 10*3/uL (ref 0.0–0.2)
Basos: 0 %
EOS (ABSOLUTE): 0 10*3/uL (ref 0.0–0.4)
Eos: 1 %
Hematocrit: 25.6 % — ABNORMAL LOW (ref 37.5–51.0)
Hemoglobin: 8.2 g/dL — ABNORMAL LOW (ref 13.0–17.7)
Immature Grans (Abs): 0 10*3/uL (ref 0.0–0.1)
Immature Granulocytes: 0 %
Lymphocytes Absolute: 1.2 10*3/uL (ref 0.7–3.1)
Lymphs: 22 %
MCH: 27.9 pg (ref 26.6–33.0)
MCHC: 32 g/dL (ref 31.5–35.7)
MCV: 87 fL (ref 79–97)
Monocytes Absolute: 0.7 10*3/uL (ref 0.1–0.9)
Monocytes: 14 %
Neutrophils Absolute: 3.4 10*3/uL (ref 1.4–7.0)
Neutrophils: 63 %
Platelets: 169 10*3/uL (ref 150–450)
RBC: 2.94 x10E6/uL — ABNORMAL LOW (ref 4.14–5.80)
RDW: 14.5 % (ref 11.6–15.4)
WBC: 5.3 10*3/uL (ref 3.4–10.8)

## 2020-10-07 LAB — BASIC METABOLIC PANEL
BUN/Creatinine Ratio: 15 (ref 10–24)
BUN: 42 mg/dL — ABNORMAL HIGH (ref 8–27)
CO2: 22 mmol/L (ref 20–29)
Calcium: 9 mg/dL (ref 8.6–10.2)
Chloride: 101 mmol/L (ref 96–106)
Creatinine, Ser: 2.75 mg/dL — ABNORMAL HIGH (ref 0.76–1.27)
Glucose: 215 mg/dL — ABNORMAL HIGH (ref 65–99)
Potassium: 3.8 mmol/L (ref 3.5–5.2)
Sodium: 138 mmol/L (ref 134–144)
eGFR: 24 mL/min/{1.73_m2} — ABNORMAL LOW (ref >59)

## 2020-10-07 LAB — FERRITIN: Ferritin: 116 ng/mL (ref 30–400)

## 2020-10-07 LAB — IRON: Iron: 21 ug/dL — ABNORMAL LOW (ref 38–169)

## 2020-10-08 ENCOUNTER — Ambulatory Visit (INDEPENDENT_AMBULATORY_CARE_PROVIDER_SITE_OTHER): Payer: Medicare (Managed Care) | Admitting: Family Medicine

## 2020-10-08 ENCOUNTER — Other Ambulatory Visit: Payer: Self-pay

## 2020-10-08 VITALS — BP 149/94 | HR 89 | Wt 212.2 lb

## 2020-10-08 DIAGNOSIS — D649 Anemia, unspecified: Secondary | ICD-10-CM

## 2020-10-08 DIAGNOSIS — I5020 Unspecified systolic (congestive) heart failure: Secondary | ICD-10-CM

## 2020-10-08 DIAGNOSIS — Z1211 Encounter for screening for malignant neoplasm of colon: Secondary | ICD-10-CM

## 2020-10-08 DIAGNOSIS — N184 Chronic kidney disease, stage 4 (severe): Secondary | ICD-10-CM | POA: Diagnosis not present

## 2020-10-08 NOTE — Progress Notes (Signed)
    SUBJECTIVE:   CHIEF COMPLAINT / HPI:    HFrEF  CKD4 Patient seen 2 days ago by Dr. [name] who was concerned that patient was experiencing scrotal swelling, lower extremity edema and rales on lung exam, as well as elevated JVD, all consistent with acute decompensated heart failure.  Dr. Pilar Plate encourage fluids.  Shows diuresis from torsemide 20 mg twice daily up to torsemide 40 mg twice daily.  He asked the patient to follow-up in 2 days to see how he was doing and to recheck his kidney function as well as volume status.  Patient has lost about 5 pounds according to her scale from his most recent visit to the clinic.  Reports he is feeling much better than he was 2 days ago.  Says that swelling in his lower extremities is still there but is overall improving.  Dr. Pilar Plate also wanted patient to have repeat echo, ordered by CMA's at today's visit.  Anemia Patient with anemia history, iron on recent blood work was low.  He is due for colonoscopy.  Patient agreeable to referral to GI for colonoscopy.  Also plan to recheck his CBC today.   PERTINENT  PMH / PSH:  Patient Active Problem List   Diagnosis Date Noted  . Anemia 10/06/2020  . Foot injury 10/06/2020  . Chronic kidney disease, stage 4 (severe) ([place]) 01/08/2020  . Diabetes mellitus without complication ([place]) [id number]  . Hyponatremia 04/14/2019  . Essential hypertension 04/14/2019  . Renal insufficiency 04/14/2019  . NSVT (nonsustained ventricular tachycardia) ([place]) 04/14/2019  . HFrEF (heart failure with reduced ejection fraction) ([place]) 04/12/2019     OBJECTIVE:   BP (!) 149/94   Pulse 89   Wt 212 lb 4 oz (96.3 kg)   SpO2 99%   BMI 31.34 kg/m    Physical exam: General: Well-appearing, great energy, no acute distress Neck: JVD appreciated to about 1 cm below the ramus of patient's jaw Respiratory: CTA bilaterally, comfortable work of breathing, patient moving air well Cardio: RRR, S1-S2 present, no murmurs  appreciated Extremities: Patient with bilateral 2+ lower extremity edema, patient reports this is improved from prior exam   ASSESSMENT/PLAN:   Chronic kidney disease, stage 4 (severe) (HCC) -BMP repeated at today's visit as patient is still on torsemide 40 mg twice daily  Anemia Recent iron level low. -Referral placed to GI for colonoscopy -Recheck CBC  HFrEF (heart failure with reduced ejection fraction) ([place]) Patient is overall fluid status improved at today's visit, weight down about 5 pounds since starting torsemide 40 mg twice daily. -Continue torsemide 40 mg twice daily -Recheck BMP, BNP -Echocardiogram ordered at today's visit -Recommended patient buys and wears compression stockings     Daisy Floro, [place]

## 2020-10-08 NOTE — Assessment & Plan Note (Signed)
Patient is overall fluid status improved at today's visit, weight down about 5 pounds since starting torsemide 40 mg twice daily. -Continue torsemide 40 mg twice daily -Recheck BMP, BNP -Echocardiogram ordered at today's visit -Recommended patient buys and wears compression stockings

## 2020-10-08 NOTE — Assessment & Plan Note (Signed)
Recent iron level low. -Referral placed to GI for colonoscopy -Recheck CBC

## 2020-10-08 NOTE — Assessment & Plan Note (Signed)
-  BMP repeated at today's visit as patient is still on torsemide 40 mg twice daily

## 2020-10-08 NOTE — Patient Instructions (Signed)
Thank you for coming in to see [place] today! Please see below to review our plan for today's visit:  1. We are rechecking your kidney function today. Please take all of your meds as prescribed.  2. Go to [facility] or the local drug store to get some compression stockings. Compression stockings help to decrease swelling in your legs and decrease wounds in your legs/feet. 3. I have referred you for colonoscopy to look into other causes of your anemia.   Please call the clinic at [phone] if your symptoms worsen or you have any concerns. It was our pleasure to serve you!   Dr. Milus Banister [facility]

## 2020-10-09 ENCOUNTER — Telehealth: Payer: Self-pay

## 2020-10-09 LAB — CBC
Hematocrit: 26.1 % — ABNORMAL LOW (ref 37.5–51.0)
Hemoglobin: 8.5 g/dL — ABNORMAL LOW (ref 13.0–17.7)
MCH: 28 pg (ref 26.6–33.0)
MCHC: 32.6 g/dL (ref 31.5–35.7)
MCV: 86 fL (ref 79–97)
Platelets: 168 10*3/uL (ref 150–450)
RBC: 3.04 x10E6/uL — ABNORMAL LOW (ref 4.14–5.80)
RDW: 14.5 % (ref 11.6–15.4)
WBC: 5.9 10*3/uL (ref 3.4–10.8)

## 2020-10-09 LAB — BASIC METABOLIC PANEL
BUN/Creatinine Ratio: 15 (ref 10–24)
BUN: 42 mg/dL — ABNORMAL HIGH (ref 8–27)
CO2: 20 mmol/L (ref 20–29)
Calcium: 9.2 mg/dL (ref 8.6–10.2)
Chloride: 101 mmol/L (ref 96–106)
Creatinine, Ser: 2.74 mg/dL — ABNORMAL HIGH (ref 0.76–1.27)
Glucose: 179 mg/dL — ABNORMAL HIGH (ref 65–99)
Potassium: 4 mmol/L (ref 3.5–5.2)
Sodium: 139 mmol/L (ref 134–144)
eGFR: 24 mL/min/{1.73_m2} — ABNORMAL LOW (ref >59)

## 2020-10-09 LAB — BRAIN NATRIURETIC PEPTIDE: BNP: 2837.9 pg/mL — ABNORMAL HIGH (ref 0.0–100.0)

## 2020-10-09 NOTE — Progress Notes (Signed)
Appt made for 6/24. Salvatore Marvel, CMA

## 2020-10-09 NOTE — Telephone Encounter (Signed)
Patient calls nurse line stating he is returning phone call to provider. Please return call to patient at [phone].   Patient also has questions as to if he is supposed to schedule appointment for follow up labs.   Please advise.   Talbot Grumbling, RN

## 2020-10-11 NOTE — Patient Instructions (Addendum)
It was wonderful to meet you today. Thank you for allowing me to be a part of your care. Below is a short summary of what we discussed at your visit today:  Heart failure, water pill and weight Your weight is down today. You were 212 lb on 5/25 and are now 196 today.  - I Jose repeat your blood work to check your electrolytes.  - If the results are normal, I Gokul send you a letter or MyChart message. If the results are abnormal, I Zhamir give you a call.   Colonoscopy I have referred you to a GI office today for colonoscopy. It is with [facility]. Please call them soon to schedule a colonoscopy.     Please bring all of your medications to every appointment!  If you have any questions or concerns, please do not hesitate to contact us via phone or MyChart message.   Ezequiel Essex, MD

## 2020-10-16 ENCOUNTER — Ambulatory Visit (INDEPENDENT_AMBULATORY_CARE_PROVIDER_SITE_OTHER): Payer: Medicare (Managed Care) | Admitting: Family Medicine

## 2020-10-16 ENCOUNTER — Other Ambulatory Visit: Payer: Self-pay

## 2020-10-16 ENCOUNTER — Encounter: Payer: Self-pay | Admitting: Family Medicine

## 2020-10-16 VITALS — BP 124/78 | HR 88 | Ht 69.0 in | Wt 196.0 lb

## 2020-10-16 DIAGNOSIS — Z1211 Encounter for screening for malignant neoplasm of colon: Secondary | ICD-10-CM

## 2020-10-16 DIAGNOSIS — E119 Type 2 diabetes mellitus without complications: Secondary | ICD-10-CM

## 2020-10-16 DIAGNOSIS — I5020 Unspecified systolic (congestive) heart failure: Secondary | ICD-10-CM | POA: Diagnosis not present

## 2020-10-16 DIAGNOSIS — I10 Essential (primary) hypertension: Secondary | ICD-10-CM | POA: Diagnosis not present

## 2020-10-16 DIAGNOSIS — N184 Chronic kidney disease, stage 4 (severe): Secondary | ICD-10-CM | POA: Diagnosis not present

## 2020-10-16 DIAGNOSIS — N289 Disorder of kidney and ureter, unspecified: Secondary | ICD-10-CM | POA: Diagnosis not present

## 2020-10-16 DIAGNOSIS — E871 Hypo-osmolality and hyponatremia: Secondary | ICD-10-CM

## 2020-10-16 LAB — POCT GLYCOSYLATED HEMOGLOBIN (HGB A1C): HbA1c, POC (controlled diabetic range): 6.5 % (ref 0.0–7.0)

## 2020-10-16 NOTE — Progress Notes (Signed)
    SUBJECTIVE:   CHIEF COMPLAINT / HPI:   Follow up on heart failure exacerbation - initially seen 5/23 for scrotal swelling, BLE edema found to be in heart failure exacerbation - on 5/23, increased torsemide from 20 mg BID to 40 mg BID - BMP 5/23 with K+ 3.8 - followed up on 5/25, was found to be 5 lb down and feeling much better - lab from 5/25 demonstrate BNP 2,837.9, K+ 4.0 - 5/25 was continued on torsemide 40 mg BID - weight down today, 196 lb, compared to 212.4 on 5/25 - at today's visit, patient reports all swelling improved and back to baseline, feels much better, no complaints - repeat ECHO scheduled for 11/07/20  PERTINENT  PMH / PSH: HFrEF, HTN, T2DM, NSVT, CKD stage 4  OBJECTIVE:   BP 124/78   Pulse 88   Ht '5\' 9"'$  (1.753 m)   Wt 196 lb (88.9 kg)   SpO2 97%   BMI 28.94 kg/m    PHQ-9:  Depression screen [facility] 2/9 10/16/2020 10/08/2020 11/29/2019  Decreased Interest 0 0 0  Down, Depressed, Hopeless 0 0 0  PHQ - 2 Score 0 0 0  Altered sleeping 0 1 -  Tired, decreased energy 0 0 -  Change in appetite 0 0 -  Feeling bad or failure about yourself  0 0 -  Trouble concentrating 0 0 -  Moving slowly or fidgety/restless 0 0 -  Suicidal thoughts 0 0 -  PHQ-9 Score 0 1 -  Difficult doing work/chores Not difficult at all - -     GAD-7: No flowsheet data found.   Physical Exam General: Awake, alert, oriented Cardiovascular: Regular rate and rhythm, S1 and S2 present, no murmurs auscultated, brisk cap refill Respiratory: Lung fields clear to auscultation bilaterally Extremities: Trace BLE edema, palpable pedal and pretibial pulses bilaterally Neuro: Cranial nerves II through X grossly intact, able to move all extremities spontaneously   ASSESSMENT/PLAN:   HFrEF (heart failure with reduced ejection fraction) (HCC) Weight down from 212.4 on 5/25 to 196 today (6/2). Tolerating increased torsemide well. Only trace BLE edema today with clear lungs. Appears euvolemic. No  clear inciting event to exacerbation, may be due to progressively worsening heart function. May need continued daily torsemide 40 mg. Graysyn collect BMP to check creatinine and potassium. Repeat ECHO scheduled for 11/07/20.   Diabetes mellitus without complication (HCC) 123456 today 6.5, down from 7.0 last check. Continue metformin 1,000 mg BID.      Ezequiel Essex, MD [place]

## 2020-10-17 ENCOUNTER — Encounter: Payer: Self-pay | Admitting: Family Medicine

## 2020-10-17 LAB — BASIC METABOLIC PANEL
BUN/Creatinine Ratio: 14 (ref 10–24)
BUN: 38 mg/dL — ABNORMAL HIGH (ref 8–27)
CO2: 26 mmol/L (ref 20–29)
Calcium: 8.7 mg/dL (ref 8.6–10.2)
Chloride: 96 mmol/L (ref 96–106)
Creatinine, Ser: 2.75 mg/dL — ABNORMAL HIGH (ref 0.76–1.27)
Glucose: 179 mg/dL — ABNORMAL HIGH (ref 65–99)
Potassium: 3.7 mmol/L (ref 3.5–5.2)
Sodium: 140 mmol/L (ref 134–144)
eGFR: 24 mL/min/{1.73_m2} — ABNORMAL LOW (ref >59)

## 2020-10-19 ENCOUNTER — Encounter: Payer: Self-pay | Admitting: Family Medicine

## 2020-10-19 NOTE — Assessment & Plan Note (Signed)
A1c today 6.5, down from 7.0 last check. Continue metformin 1,000 mg BID.

## 2020-10-19 NOTE — Assessment & Plan Note (Addendum)
Weight down from 212.4 on 5/25 to 196 today (6/2). Tolerating increased torsemide well. Only trace BLE edema today with clear lungs. Appears euvolemic. No clear inciting event to exacerbation, may be due to progressively worsening heart function. May need continued daily torsemide 40 mg. [name] collect BMP to check creatinine and potassium. Repeat ECHO scheduled for 11/07/20.

## 2020-11-07 ENCOUNTER — Other Ambulatory Visit (HOSPITAL_COMMUNITY): Payer: Medicare (Managed Care)

## 2020-11-11 ENCOUNTER — Telehealth: Payer: Self-pay

## 2020-11-11 NOTE — Telephone Encounter (Signed)
Patient calls nurse line to report medication changes after seeing nephrologist yesterday. He is a patient at [facility]. Patient reports that specialist has discontinued metformin and has decreased torsemide to 2 tablets in the AM and 1 tablet in the PM.   Advised patient that he may need an appointment to further discuss medication management. However, patient states that he is unable to come in for appointment at this time due to high gas prices.   Please advise.   Talbot Grumbling, RN

## 2020-11-13 ENCOUNTER — Other Ambulatory Visit: Payer: Self-pay | Admitting: Family Medicine

## 2020-11-13 MED ORDER — TORSEMIDE 20 MG PO TABS: ORAL_TABLET | ORAL | 2 refills | Status: DC

## 2020-11-21 ENCOUNTER — Other Ambulatory Visit: Payer: Self-pay | Admitting: Internal Medicine

## 2020-11-21 DIAGNOSIS — N184 Chronic kidney disease, stage 4 (severe): Secondary | ICD-10-CM

## 2020-12-02 ENCOUNTER — Other Ambulatory Visit (HOSPITAL_COMMUNITY): Payer: Medicare (Managed Care)

## 2020-12-11 ENCOUNTER — Other Ambulatory Visit: Payer: Self-pay | Admitting: Cardiology

## 2021-01-08 ENCOUNTER — Other Ambulatory Visit: Payer: Self-pay

## 2021-01-08 MED ORDER — ISOSORBIDE MONONITRATE ER 30 MG PO TB24: 30.0000 mg | ORAL_TABLET | Freq: Every day | ORAL | 2 refills | Status: DC

## 2021-01-08 MED ORDER — HYDRALAZINE HCL 25 MG PO TABS: 25.0000 mg | ORAL_TABLET | Freq: Three times a day (TID) | ORAL | 2 refills | Status: DC

## 2021-01-20 ENCOUNTER — Telehealth: Payer: Self-pay

## 2021-01-20 NOTE — Telephone Encounter (Signed)
LVM to have pt call back to schedule AWV.   RE: confirm insurance and schedule AWV on my schedule if times are convenient for patient or other AWV schedule as template permits.   

## 2021-04-24 ENCOUNTER — Ambulatory Visit (INDEPENDENT_AMBULATORY_CARE_PROVIDER_SITE_OTHER): Payer: Medicare (Managed Care) | Admitting: Family Medicine

## 2021-04-24 ENCOUNTER — Other Ambulatory Visit (HOSPITAL_COMMUNITY)
Admission: RE | Admit: 2021-04-24 | Discharge: 2021-04-24 | Disposition: A | Discharge status: Home / Self Care | Payer: Medicare (Managed Care) | Source: Ambulatory Visit | Attending: Family Medicine | Admitting: Family Medicine

## 2021-04-24 ENCOUNTER — Other Ambulatory Visit: Payer: Self-pay

## 2021-04-24 ENCOUNTER — Encounter: Payer: Self-pay | Admitting: Family Medicine

## 2021-04-24 VITALS — BP 139/75 | HR 90 | Ht 69.0 in | Wt 199.6 lb

## 2021-04-24 DIAGNOSIS — Z23 Encounter for immunization: Secondary | ICD-10-CM

## 2021-04-24 DIAGNOSIS — I10 Essential (primary) hypertension: Secondary | ICD-10-CM

## 2021-04-24 DIAGNOSIS — R300 Dysuria: Secondary | ICD-10-CM

## 2021-04-24 DIAGNOSIS — N184 Chronic kidney disease, stage 4 (severe): Secondary | ICD-10-CM | POA: Diagnosis not present

## 2021-04-24 DIAGNOSIS — D649 Anemia, unspecified: Secondary | ICD-10-CM

## 2021-04-24 DIAGNOSIS — E119 Type 2 diabetes mellitus without complications: Secondary | ICD-10-CM

## 2021-04-24 DIAGNOSIS — I5020 Unspecified systolic (congestive) heart failure: Secondary | ICD-10-CM

## 2021-04-24 LAB — POCT UA - MICROSCOPIC ONLY: WBC, Ur, HPF, POC: >20 (ref 0–5)

## 2021-04-24 MED ORDER — ISOSORBIDE MONONITRATE ER 30 MG PO TB24: 30.0000 mg | ORAL_TABLET | Freq: Every day | ORAL | 2 refills | Status: DC

## 2021-04-24 MED ORDER — HYDRALAZINE HCL 25 MG PO TABS: 25.0000 mg | ORAL_TABLET | Freq: Three times a day (TID) | ORAL | 2 refills | Status: DC

## 2021-04-24 NOTE — Patient Instructions (Addendum)
Please call [phone] to make an appointment with the heart doctor.   Please call  [phone] to make an appointment with the kidney doctor.   I have refilled your imdur and hydralazine.   I Gottlieb notify you of results when available. Follow up in 1 month.   Dr. Janus Molder

## 2021-04-24 NOTE — Progress Notes (Signed)
    SUBJECTIVE:   CHIEF COMPLAINT / HPI:   Mr. [name] is a 70 yo M who present for follow up and medication refill; no concerns today. He states he wants to get his health back on track. He has been out of his blood pressure medication for 2-3 months now. He has not seen his cardiologist in a while he says. His nephrologist took him off of metformin 6 months ago. He denies fever, headache, chest pain, and palpitations.   PERTINENT  PMH / PSH: HFrEF, HTN, T2DM, CKD stage 4, anemia  OBJECTIVE:   BP 139/75   Pulse 90   Ht 5\' 9"  (1.753 m)   Wt 199 lb 9.6 oz (90.5 kg)   SpO2 98%   BMI 29.48 kg/m   General: Appears well, no acute distress. Age appropriate. Cardiac: RRR, normal heart sounds, no murmurs Respiratory: CTAB, normal effort Extremities: No edema or cyanosis. Neuro: alert and oriented Psych: normal affect  ASSESSMENT/PLAN:   HFrEF (heart failure with reduced ejection fraction) (HCC)  Essential hypertension  Chronic kidney disease, stage 4 (severe) (HCC)  Diabetes mellitus without complication ([place]) No concerns today. Need medication refill. VSS. Out of imdur and hydral for several months. BP is at goal. Reviewed prior nephrology note and decreased torsemide and discontinued metformin. Reviewed cardiology appointment 06/05/2019 instructed to follow-up 2 weeks following that visit to titrate heart failure medications; patient failed to follow-up reason unknown.  Does have medications here today discussed restarting medications with Dr. McDiarmid we Reda restart Imdur and hydralazine and encourage cardiology follow-up.  Office number given.  We Nicolus also refer patient to CCM for further assistance.  His care is affected secondary to lack of finances.  He has missed several appointments due to transportation, not having money for co-pay. - AMB Referral to [place] - hydrALAZINE (APRESOLINE) 25 MG tablet; Take 1 tablet (25 mg total) by mouth 3 (three) times daily.   Dispense: 90 tablet; Refill: 2 - isosorbide mononitrate (IMDUR) 30 MG 24 hr tablet; Take 1 tablet (30 mg total) by mouth daily.  Dispense: 30 tablet; Refill: 2 - Basic Metabolic Panel - Follow-up in 1 month or sooner if needed; address need for colonoscopy at this time.  Anemia, unspecified type - CBC with Differential - Colonoscopy needed [place] address at follow-up visit  Need for immunization against influenza - Flu Vaccine QUAD High Dose(Fluad)  Dysuria Mentioned after visit in the hallway.  Zakkary obtain labs and further assess at follow-up visit if needed. - POCT UA - Microscopic Only - Urine cytology ancillary only     Gerlene Fee, DO [place]

## 2021-04-25 LAB — CBC WITH DIFFERENTIAL/PLATELET
Basophils Absolute: 0 10*3/uL (ref 0.0–0.2)
Basos: 1 %
EOS (ABSOLUTE): 0.1 10*3/uL (ref 0.0–0.4)
Eos: 1 %
Hematocrit: 29.2 % — ABNORMAL LOW (ref 37.5–51.0)
Hemoglobin: 10 g/dL — ABNORMAL LOW (ref 13.0–17.7)
Immature Grans (Abs): 0 10*3/uL (ref 0.0–0.1)
Immature Granulocytes: 0 %
Lymphocytes Absolute: 1.7 10*3/uL (ref 0.7–3.1)
Lymphs: 32 %
MCH: 28.9 pg (ref 26.6–33.0)
MCHC: 34.2 g/dL (ref 31.5–35.7)
MCV: 84 fL (ref 79–97)
Monocytes Absolute: 0.9 10*3/uL (ref 0.1–0.9)
Monocytes: 16 %
Neutrophils Absolute: 2.7 10*3/uL (ref 1.4–7.0)
Neutrophils: 50 %
Platelets: 223 10*3/uL (ref 150–450)
RBC: 3.46 x10E6/uL — ABNORMAL LOW (ref 4.14–5.80)
RDW: 13.1 % (ref 11.6–15.4)
WBC: 5.3 10*3/uL (ref 3.4–10.8)

## 2021-04-25 LAB — BASIC METABOLIC PANEL
BUN/Creatinine Ratio: 14 (ref 10–24)
BUN: 43 mg/dL — ABNORMAL HIGH (ref 8–27)
CO2: 23 mmol/L (ref 20–29)
Calcium: 8 mg/dL — ABNORMAL LOW (ref 8.6–10.2)
Chloride: 98 mmol/L (ref 96–106)
Creatinine, Ser: 3.11 mg/dL — ABNORMAL HIGH (ref 0.76–1.27)
Glucose: 196 mg/dL — ABNORMAL HIGH (ref 70–99)
Potassium: 4.2 mmol/L (ref 3.5–5.2)
Sodium: 137 mmol/L (ref 134–144)
eGFR: 21 mL/min/{1.73_m2} — ABNORMAL LOW (ref >59)

## 2021-04-28 ENCOUNTER — Other Ambulatory Visit: Payer: Self-pay | Admitting: Family Medicine

## 2021-04-28 DIAGNOSIS — I10 Essential (primary) hypertension: Secondary | ICD-10-CM

## 2021-04-28 DIAGNOSIS — N184 Chronic kidney disease, stage 4 (severe): Secondary | ICD-10-CM

## 2021-04-28 DIAGNOSIS — I5020 Unspecified systolic (congestive) heart failure: Secondary | ICD-10-CM

## 2021-04-28 DIAGNOSIS — E119 Type 2 diabetes mellitus without complications: Secondary | ICD-10-CM

## 2021-04-28 DIAGNOSIS — D649 Anemia, unspecified: Secondary | ICD-10-CM

## 2021-04-28 LAB — URINE CYTOLOGY ANCILLARY ONLY
Chlamydia: NEGATIVE
Comment: NEGATIVE
Comment: NEGATIVE
Comment: NORMAL
Neisseria Gonorrhea: NEGATIVE
Trichomonas: NEGATIVE

## 2021-05-04 ENCOUNTER — Telehealth: Payer: Self-pay | Admitting: Family Medicine

## 2021-05-04 DIAGNOSIS — R300 Dysuria: Secondary | ICD-10-CM

## 2021-05-04 DIAGNOSIS — D649 Anemia, unspecified: Secondary | ICD-10-CM

## 2021-05-04 NOTE — Telephone Encounter (Signed)
Discussed results. Sees nephrology 1/28. [name] place future order for urine culture. Patient informed of times he can come in to give urine sample. [name] place order for colonoscopy 2/2 continued anemia with unidentified bleeding source.   [place], DO 05/04/2021, 7:50 PM PGY-3, [place]

## 2021-05-13 ENCOUNTER — Ambulatory Visit
Admission: RE | Admit: 2021-05-13 | Discharge: 2021-05-13 | Disposition: A | Discharge status: Home / Self Care | Payer: Medicare (Managed Care) | Source: Ambulatory Visit | Attending: Internal Medicine | Admitting: Internal Medicine

## 2021-05-13 DIAGNOSIS — N184 Chronic kidney disease, stage 4 (severe): Secondary | ICD-10-CM

## 2021-05-27 ENCOUNTER — Ambulatory Visit: Payer: Medicare (Managed Care) | Admitting: Pharmacist

## 2021-06-01 ENCOUNTER — Other Ambulatory Visit: Payer: Self-pay

## 2021-06-01 MED ORDER — TORSEMIDE 20 MG PO TABS: ORAL_TABLET | ORAL | 0 refills | Status: DC

## 2021-06-01 NOTE — Telephone Encounter (Signed)
Patient needs follow up appointment with me and cardiology.   Wynter Grave [name], DO 06/01/2021, 6:52 PM PGY-3, [place]

## 2021-06-05 ENCOUNTER — Other Ambulatory Visit: Payer: Self-pay | Admitting: Family Medicine

## 2021-06-05 MED ORDER — TORSEMIDE 20 MG PO TABS: ORAL_TABLET | ORAL | 0 refills | Status: DC

## 2021-06-05 NOTE — Addendum Note (Signed)
Addended by: Talbot Grumbling on: 06/05/2021 02:56 PM   Modules accepted: Orders

## 2021-06-05 NOTE — Telephone Encounter (Signed)
Patient calls nurse line to check on status of refill.   Medication was approved on 06/01/2021, however, was sent to "No print" and was not received by pharmacy. Resent rx to patient's preferred pharmacy.   Scheduled patient with PCP for 06/17/21. Advised patient to also schedule appointment with cardiology.   Patient verbalizes understanding.   Talbot Grumbling, RN

## 2021-06-12 ENCOUNTER — Encounter: Payer: Self-pay | Admitting: Family Medicine

## 2021-06-17 ENCOUNTER — Ambulatory Visit (INDEPENDENT_AMBULATORY_CARE_PROVIDER_SITE_OTHER): Payer: Medicare PPO | Admitting: Family Medicine

## 2021-06-17 ENCOUNTER — Encounter: Payer: Self-pay | Admitting: Family Medicine

## 2021-06-17 ENCOUNTER — Other Ambulatory Visit: Payer: Self-pay

## 2021-06-17 VITALS — BP 169/108 | HR 109 | Wt 203.6 lb

## 2021-06-17 DIAGNOSIS — I499 Cardiac arrhythmia, unspecified: Secondary | ICD-10-CM

## 2021-06-17 DIAGNOSIS — I5020 Unspecified systolic (congestive) heart failure: Secondary | ICD-10-CM | POA: Diagnosis not present

## 2021-06-17 DIAGNOSIS — I10 Essential (primary) hypertension: Secondary | ICD-10-CM | POA: Diagnosis not present

## 2021-06-17 DIAGNOSIS — N184 Chronic kidney disease, stage 4 (severe): Secondary | ICD-10-CM

## 2021-06-17 DIAGNOSIS — I4891 Unspecified atrial fibrillation: Secondary | ICD-10-CM | POA: Diagnosis not present

## 2021-06-17 MED ORDER — CARVEDILOL 25 MG PO TABS: ORAL_TABLET | ORAL | 3 refills | Status: DC

## 2021-06-17 MED ORDER — APIXABAN 5 MG PO TABS: 5.0000 mg | ORAL_TABLET | Freq: Two times a day (BID) | ORAL | 0 refills | Status: DC

## 2021-06-17 NOTE — Progress Notes (Deleted)
° ° °  SUBJECTIVE:   CHIEF COMPLAINT / HPI:   ***  PERTINENT  PMH / PSH: ***  OBJECTIVE:   BP (!) 173/108    Pulse (!) 109    Wt 203 lb 9.6 oz (92.4 kg)    SpO2 100%    BMI 30.07 kg/m   ***  ASSESSMENT/PLAN:   No problem-specific Assessment & Plan notes found for this encounter.     Gerlene Fee, [place]

## 2021-06-17 NOTE — Patient Instructions (Addendum)
Thank you for coming in today.  Today we found out that you have atrial fibrillation this is where your heart beats abnormally.  I Mansfield have to start you on a new medication and this is called Eliquis.  I Manny need for you to follow-up in 2 days for a hemoglobin check.  I Carmello also get labs today.  I have put in a referral to the cardiologist.  The heart doctor they Nima give you a call to schedule a visit.  We discussed setting timers for your medication.  Please go to pharmacy and pick up Coreg this is a medication for your blood pressure.  Follow-up in 1 week for blood pressure recheck.

## 2021-06-17 NOTE — Progress Notes (Addendum)
° ° °  SUBJECTIVE:   CHIEF COMPLAINT / HPI:   Mr. Wellbrock is a 71 yo M who presents for follow up.   Hypertension - Medications: imdur, hydral, coreg - Compliance: Out of coreg for 1 month, missing doses - Checking BP at home: Yes, SBP 135-140 - Denies any SOB, CP, vision changes, LE edema, medication SEs, or symptoms of hypotension - Exercise: Walks everyday and night  PERTINENT  PMH / PSH: HFrEF, DM, CKD stage IV, anemia  OBJECTIVE:   BP (!) 169/108    Pulse (!) 109    Wt 203 lb 9.6 oz (92.4 kg)    SpO2 100%    BMI 30.07 kg/m    EKG: Atrial fibrillation, significant change from last  Physical Exam Vitals reviewed.  Constitutional:      General: He is not in acute distress.    Appearance: He is not ill-appearing, toxic-appearing or diaphoretic.  Cardiovascular:     Rate and Rhythm: Tachycardia present. Rhythm irregular.     Heart sounds: Normal heart sounds.  Pulmonary:     Effort: Pulmonary effort is normal.     Breath sounds: Normal breath sounds.  Neurological:     Mental Status: He is alert and oriented to person, place, and time.  Psychiatric:        Mood and Affect: Mood normal.        Behavior: Behavior normal.   ASSESSMENT/PLAN:   Essential hypertension Significant elevated today.  Patient out of Coreg for 1 month in missing doses of other blood pressure medication.  Discussed setting timer on phone to correlate when he needs to take his medication.  For continued concern with medication adherence we Jefte have follow-up with pharmacy team.  Patient instructed to bring all medications.  Follow-up in 1 week for blood pressure recheck.  New onset a-fib ([place]) Irregularly irregular rhythm heard on auscultation today.  EKG findings significant for atrial fibrillation.  No prior diagnosis.  We [place] need to obtain basic labs as well as echo.  We Alfonsa also start Eliquis today.  CHA2DS2-VASc score 3 indicating high risk of embolic stroke, HAS-BLED score 3 indicating high risk  of bleeding.  At this time we believe benefit outweighs the risk of bleeding and should be started on anticoagulation to minimize the risk of embolic stroke..  Seen by cardiology 1 year prior we Jarod place a new referral today.  Plan to follow-up with hemoglobin in 2 days due to patient's known anemia. - CBC - Basic Metabolic Panel - ECHOCARDIOGRAM COMPLETE BUBBLE STUDY; Future - TSH - Hemoglobin; Future - apixaban (ELIQUIS) 5 MG TABS tablet; Take 1 tablet (5 mg total) by mouth 2 (two) times daily.  Dispense: 180 tablet; Refill: 0 - Ambulatory referral to Cardiology - ECHOCARDIOGRAM COMPLETE; Future  Gerlene Fee, [place]

## 2021-06-18 ENCOUNTER — Telehealth: Payer: Self-pay | Admitting: Family Medicine

## 2021-06-18 ENCOUNTER — Telehealth: Payer: Self-pay

## 2021-06-18 LAB — BASIC METABOLIC PANEL
BUN/Creatinine Ratio: 12 (ref 10–24)
BUN: 34 mg/dL — ABNORMAL HIGH (ref 8–27)
CO2: 26 mmol/L (ref 20–29)
Calcium: 8.8 mg/dL (ref 8.6–10.2)
Chloride: 96 mmol/L (ref 96–106)
Creatinine, Ser: 2.85 mg/dL — ABNORMAL HIGH (ref 0.76–1.27)
Glucose: 188 mg/dL — ABNORMAL HIGH (ref 70–99)
Potassium: 4 mmol/L (ref 3.5–5.2)
Sodium: 139 mmol/L (ref 134–144)
eGFR: 23 mL/min/{1.73_m2} — ABNORMAL LOW (ref >59)

## 2021-06-18 LAB — CBC
Hematocrit: 30 % — ABNORMAL LOW (ref 37.5–51.0)
Hemoglobin: 9.4 g/dL — ABNORMAL LOW (ref 13.0–17.7)
MCH: 27.7 pg (ref 26.6–33.0)
MCHC: 31.3 g/dL — ABNORMAL LOW (ref 31.5–35.7)
MCV: 89 fL (ref 79–97)
Platelets: 192 10*3/uL (ref 150–450)
RBC: 3.39 x10E6/uL — ABNORMAL LOW (ref 4.14–5.80)
RDW: 14.6 % (ref 11.6–15.4)
WBC: 5.5 10*3/uL (ref 3.4–10.8)

## 2021-06-18 LAB — TSH: TSH: 2.96 u[IU]/mL (ref 0.450–4.500)

## 2021-06-18 NOTE — Telephone Encounter (Signed)
Called patient to inform about appointment with Dr. Claiborne Billings 2/8 at 9 AM.  No answer left voicemail and callback number.  Gerlene Fee, DO 06/18/2021, 5:02 PM PGY-3, [place]

## 2021-06-18 NOTE — Telephone Encounter (Signed)
Patient calls nurse line reporting Eliquis is ~400 dollars. Patient reports he can not afford this. Patient reports this is a new medication for him.   Jerick forward to pharmacy team for assistance.

## 2021-06-19 ENCOUNTER — Other Ambulatory Visit (INDEPENDENT_AMBULATORY_CARE_PROVIDER_SITE_OTHER): Payer: Medicare PPO

## 2021-06-19 ENCOUNTER — Other Ambulatory Visit (HOSPITAL_COMMUNITY): Payer: Self-pay

## 2021-06-19 ENCOUNTER — Other Ambulatory Visit: Payer: Self-pay

## 2021-06-19 ENCOUNTER — Encounter: Payer: Self-pay | Admitting: Family Medicine

## 2021-06-19 DIAGNOSIS — I4891 Unspecified atrial fibrillation: Secondary | ICD-10-CM | POA: Diagnosis not present

## 2021-06-19 LAB — POCT HEMOGLOBIN: Hemoglobin: 9.7 g/dL — AB (ref 11–14.6)

## 2021-06-19 NOTE — Progress Notes (Signed)
Medication Samples have been provided to the patient.  Drug name: Eliquis       Strength: 5mg         Qty: 28 tabs  LOT: [id number]  Exp.Date: 01/15/2023  Dosing instructions: Take 1 tab (5mg ) twice daily  The patient has been instructed regarding the correct time, dose, and frequency of taking this medication, including desired effects and most common side effects.   [name] 2:44 PM 06/19/2021

## 2021-06-20 DIAGNOSIS — I4891 Unspecified atrial fibrillation: Secondary | ICD-10-CM | POA: Insufficient documentation

## 2021-06-22 ENCOUNTER — Other Ambulatory Visit: Payer: Self-pay

## 2021-06-22 ENCOUNTER — Encounter: Payer: Self-pay | Admitting: Family Medicine

## 2021-06-22 ENCOUNTER — Ambulatory Visit (HOSPITAL_COMMUNITY)
Admission: RE | Admit: 2021-06-22 | Discharge: 2021-06-22 | Disposition: A | Discharge status: Home / Self Care | Payer: Medicare PPO | Source: Ambulatory Visit | Attending: Family Medicine | Admitting: Family Medicine

## 2021-06-22 DIAGNOSIS — I4891 Unspecified atrial fibrillation: Secondary | ICD-10-CM | POA: Insufficient documentation

## 2021-06-22 DIAGNOSIS — I10 Essential (primary) hypertension: Secondary | ICD-10-CM | POA: Insufficient documentation

## 2021-06-22 DIAGNOSIS — I083 Combined rheumatic disorders of mitral, aortic and tricuspid valves: Secondary | ICD-10-CM | POA: Diagnosis not present

## 2021-06-22 DIAGNOSIS — E119 Type 2 diabetes mellitus without complications: Secondary | ICD-10-CM | POA: Diagnosis not present

## 2021-06-22 DIAGNOSIS — I493 Ventricular premature depolarization: Secondary | ICD-10-CM | POA: Diagnosis not present

## 2021-06-22 LAB — ECHOCARDIOGRAM COMPLETE
AV Vena cont: 0.2 cm
Area-P 1/2: 3.72 cm2
Calc EF: 38.5 %
MV M vel: 5.49 m/s
MV Peak grad: 120.6 mmHg
Radius: 0.4 cm
S' Lateral: 5.1 cm
Single Plane A2C EF: 41 %
Single Plane A4C EF: 31.7 %

## 2021-06-22 NOTE — Progress Notes (Signed)
°  Echocardiogram 2D Echocardiogram has been performed.  [name] 06/22/2021, 1:40 PM

## 2021-06-22 NOTE — Progress Notes (Signed)
Error.   Gerlene Fee, DO 06/22/2021, 10:43 AM PGY-3, [place]

## 2021-06-23 ENCOUNTER — Telehealth: Payer: Self-pay | Admitting: Family Medicine

## 2021-06-23 NOTE — Telephone Encounter (Signed)
Attempted to call home and cell phone. No answer. LVM to call back to discuss results. Result notes written. Needs cardiology follow up.   [place], DO 06/23/2021, 8:53 AM PGY-3, [place]

## 2021-06-24 ENCOUNTER — Ambulatory Visit (INDEPENDENT_AMBULATORY_CARE_PROVIDER_SITE_OTHER): Payer: Medicare PPO | Admitting: Pharmacist

## 2021-06-24 ENCOUNTER — Other Ambulatory Visit: Payer: Self-pay

## 2021-06-24 DIAGNOSIS — I10 Essential (primary) hypertension: Secondary | ICD-10-CM

## 2021-06-24 NOTE — Patient Instructions (Signed)
Mr. [name]. it was a pleasure seeing you today.   Your blood pressure today is improving  Continue taking blood pressure medications as prescribed.   Limiting salt intake and caffeine.  Exercising as able for at least 30 minutes for 5 days out of the week, can also help you lower your blood pressure.  Take your blood pressure at home if you are able. Please write down these numbers and bring them to your visits.  If you have any questions please call the clinic

## 2021-06-24 NOTE — Progress Notes (Signed)
° °  Subjective:    Patient ID: Lucas Albee., male    DOB: Mar 16, 1951, 71 y.o.   MRN: [id number]  HPI Patient is a 71 y.o. male who presents for hypertension management. He is in good spirits and presents without assistance. Patient was referred and last seen by Primary Care Provider on 06/17/21.  Hypertension ROS: taking medications as instructed, no medication side effects noted, home BP monitoring in range of 101-751'W systolic over 25'E diastolic, no chest pain on exertion, no dyspnea on exertion, no swelling of ankles, and no orthostatic dizziness or lightheadedness.  New concerns: none.   Dietary habits include: deli meat, salad, wheat bread Exercise habits include: treadmill  Objective:   Last 3 Office BP readings: BP Readings from Last 3 Encounters:  06/24/21 131/89  06/17/21 (!) 169/108  04/24/21 139/75    BMET    Component Value Date/Time   NA 139 06/17/2021 1429   K 4.0 06/17/2021 1429   CL 96 06/17/2021 1429   CO2 26 06/17/2021 1429   GLUCOSE 188 (H) 06/17/2021 1429   GLUCOSE 128 (H) 09/30/2020 1554   BUN 34 (H) 06/17/2021 1429   CREATININE 2.85 (H) 06/17/2021 1429   CALCIUM 8.8 06/17/2021 1429   GFRNONAA 24 (L) 09/30/2020 1554   GFRAA 23 (L) 02/08/2020 0845    Renal function: Estimated Creatinine Clearance: 26.7 mL/min (A) (by C-G formula based on SCr of 2.85 mg/dL (H)).  Clinical ASCVD: Yes  The 10-year ASCVD risk score (Arnett DK, et al., 2019) is: 35.6%   Values used to calculate the score:     Age: 48 years     Sex: Male     Is Non-Hispanic African American: Yes     Diabetic: Yes     Tobacco smoker: No     Systolic Blood Pressure: 527 mmHg     Is BP treated: Yes     HDL Cholesterol: 40 mg/dL     Total Cholesterol: 167 mg/dL  BP 131/89   Appearance alert, well appearing, and in no distress and oriented to person, place, and time. General exam BP noted to be borderline elevated today in office.   Assessment/Plan:  Hypertension improved,  asymptomatic, borderline controlled, and needs further observation.  Current treatment plan is effective, no change in therapy. Follow up: 2 months and as needed..  -[name] continue to check blood pressure readings at home -Counseled on lifestyle modifications for blood pressure control including reduced dietary sodium, increased exercise, adequate sleep.  Results reviewed and written information provided.   Total time in face-to-face counseling 20 minutes.    Medication Samples have been provided to the patient.  Drug name: Eliquis       Strength: 5mg         Qty: 56 tablets  LOT: [id number]  Exp.Date: 8/24  Dosing instructions: take 5mg  by mouth twice daily  The patient has been instructed regarding the correct time, dose, and frequency of taking this medication, including desired effects and most common side effects.   Hughes Better 10:57 AM 06/24/2021

## 2021-06-24 NOTE — Assessment & Plan Note (Signed)
Hypertension improved, asymptomatic, borderline controlled, and needs further observation.  Current treatment plan is effective, no change in therapy. Follow up: 2 months and as needed..  -[name] continue to check blood pressure readings at home -Counseled on lifestyle modifications for blood pressure control including reduced dietary sodium, increased exercise, adequate sleep.

## 2021-07-15 DIAGNOSIS — J189 Pneumonia, unspecified organism: Secondary | ICD-10-CM

## 2021-07-15 HISTORY — DX: Pneumonia, unspecified organism: J18.9

## 2021-07-20 ENCOUNTER — Other Ambulatory Visit: Payer: Self-pay

## 2021-07-20 ENCOUNTER — Other Ambulatory Visit: Payer: Self-pay | Admitting: Family Medicine

## 2021-07-20 DIAGNOSIS — I5020 Unspecified systolic (congestive) heart failure: Secondary | ICD-10-CM

## 2021-07-20 MED ORDER — TORSEMIDE 20 MG PO TABS: ORAL_TABLET | ORAL | 0 refills | Status: DC

## 2021-07-20 MED ORDER — HYDRALAZINE HCL 25 MG PO TABS: 25.0000 mg | ORAL_TABLET | Freq: Three times a day (TID) | ORAL | 2 refills | Status: DC

## 2021-07-20 MED ORDER — ISOSORBIDE MONONITRATE ER 30 MG PO TB24: 30.0000 mg | ORAL_TABLET | Freq: Every day | ORAL | 2 refills | Status: DC

## 2021-07-24 IMAGING — CR DG CHEST 2V
2 series · 2 of 2 positions shown · non-contrast
Comparison: April 12, 2019

CLINICAL DATA: Bilateral lower extremity pitting edema.

EXAM:
CHEST - 2 VIEW

[chest pa]
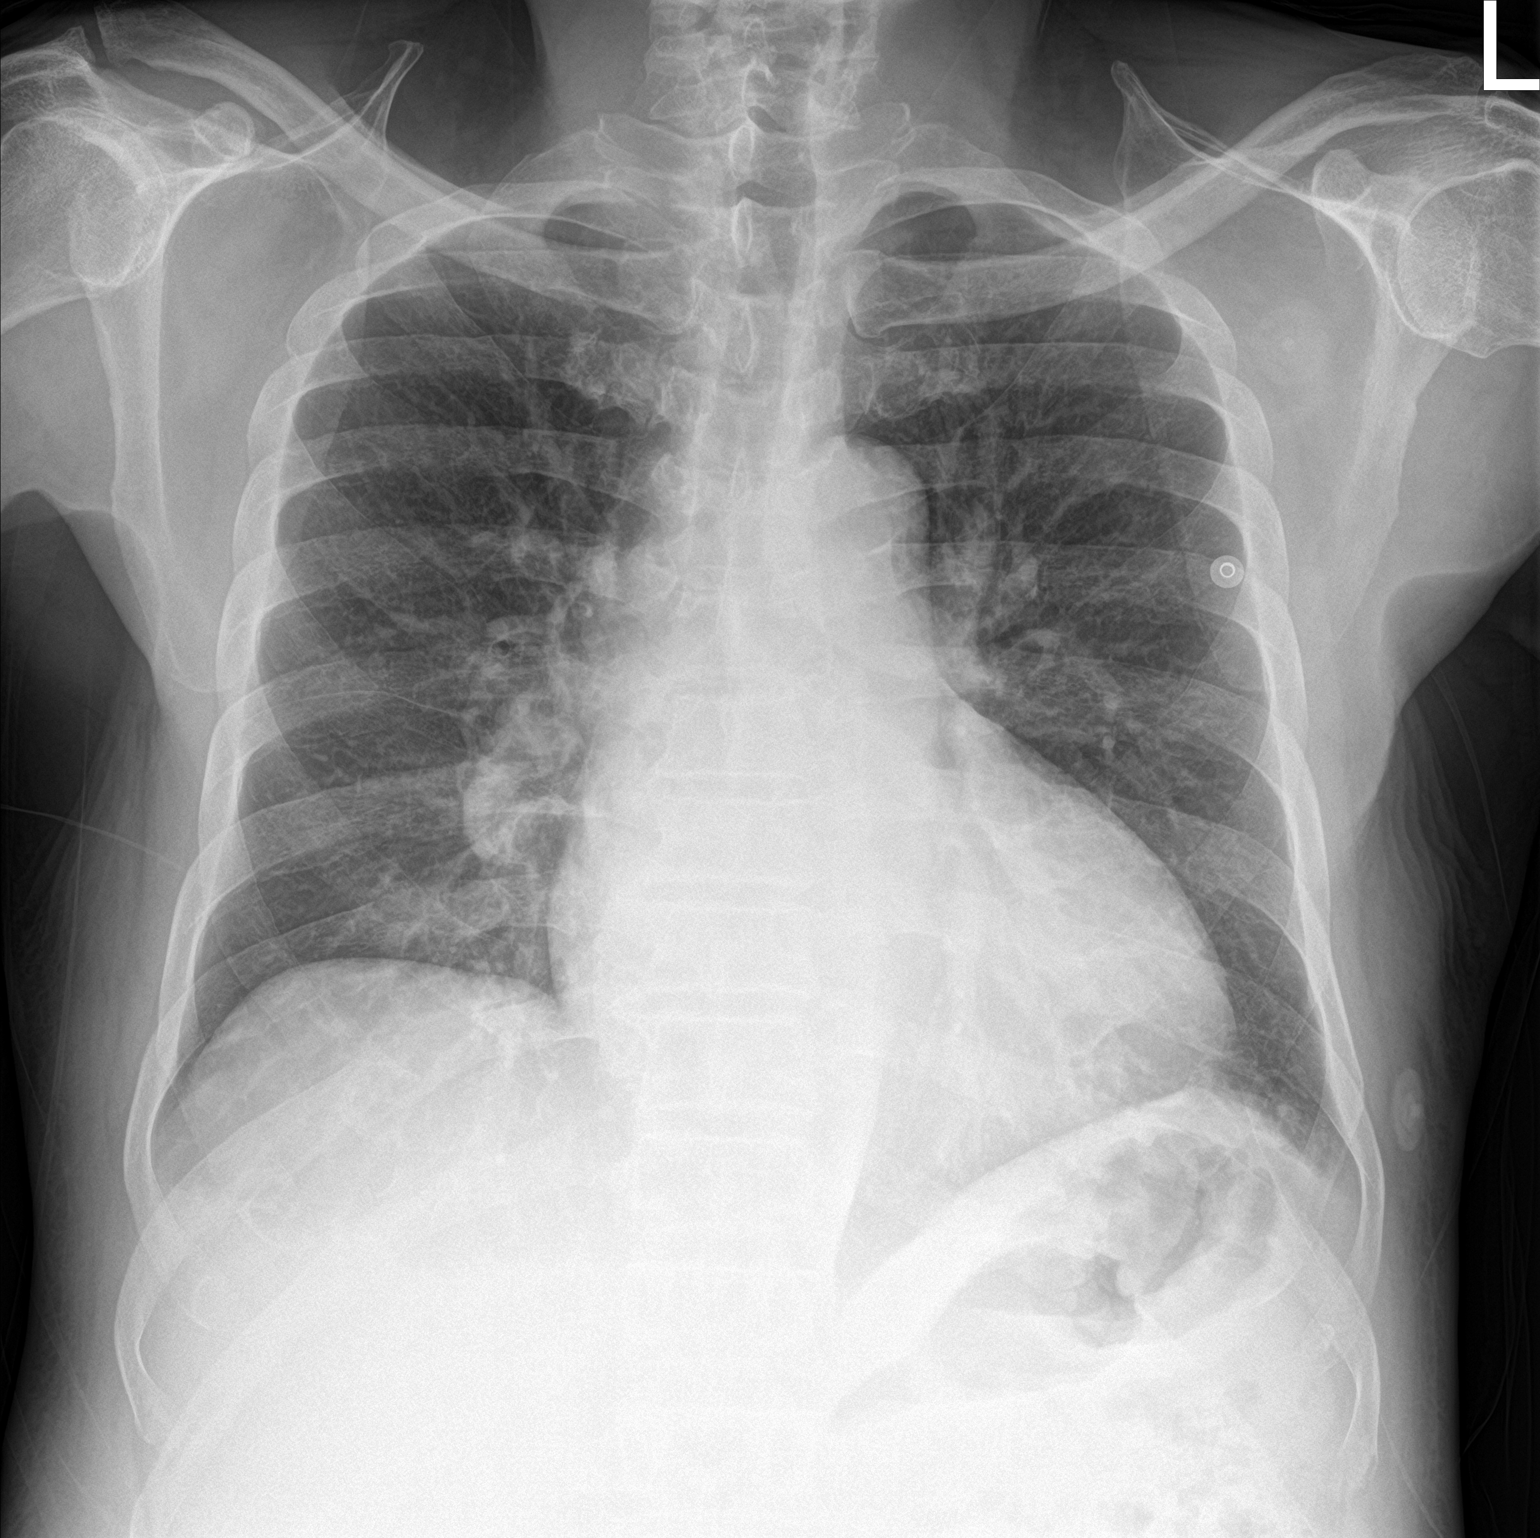

[chest lat]
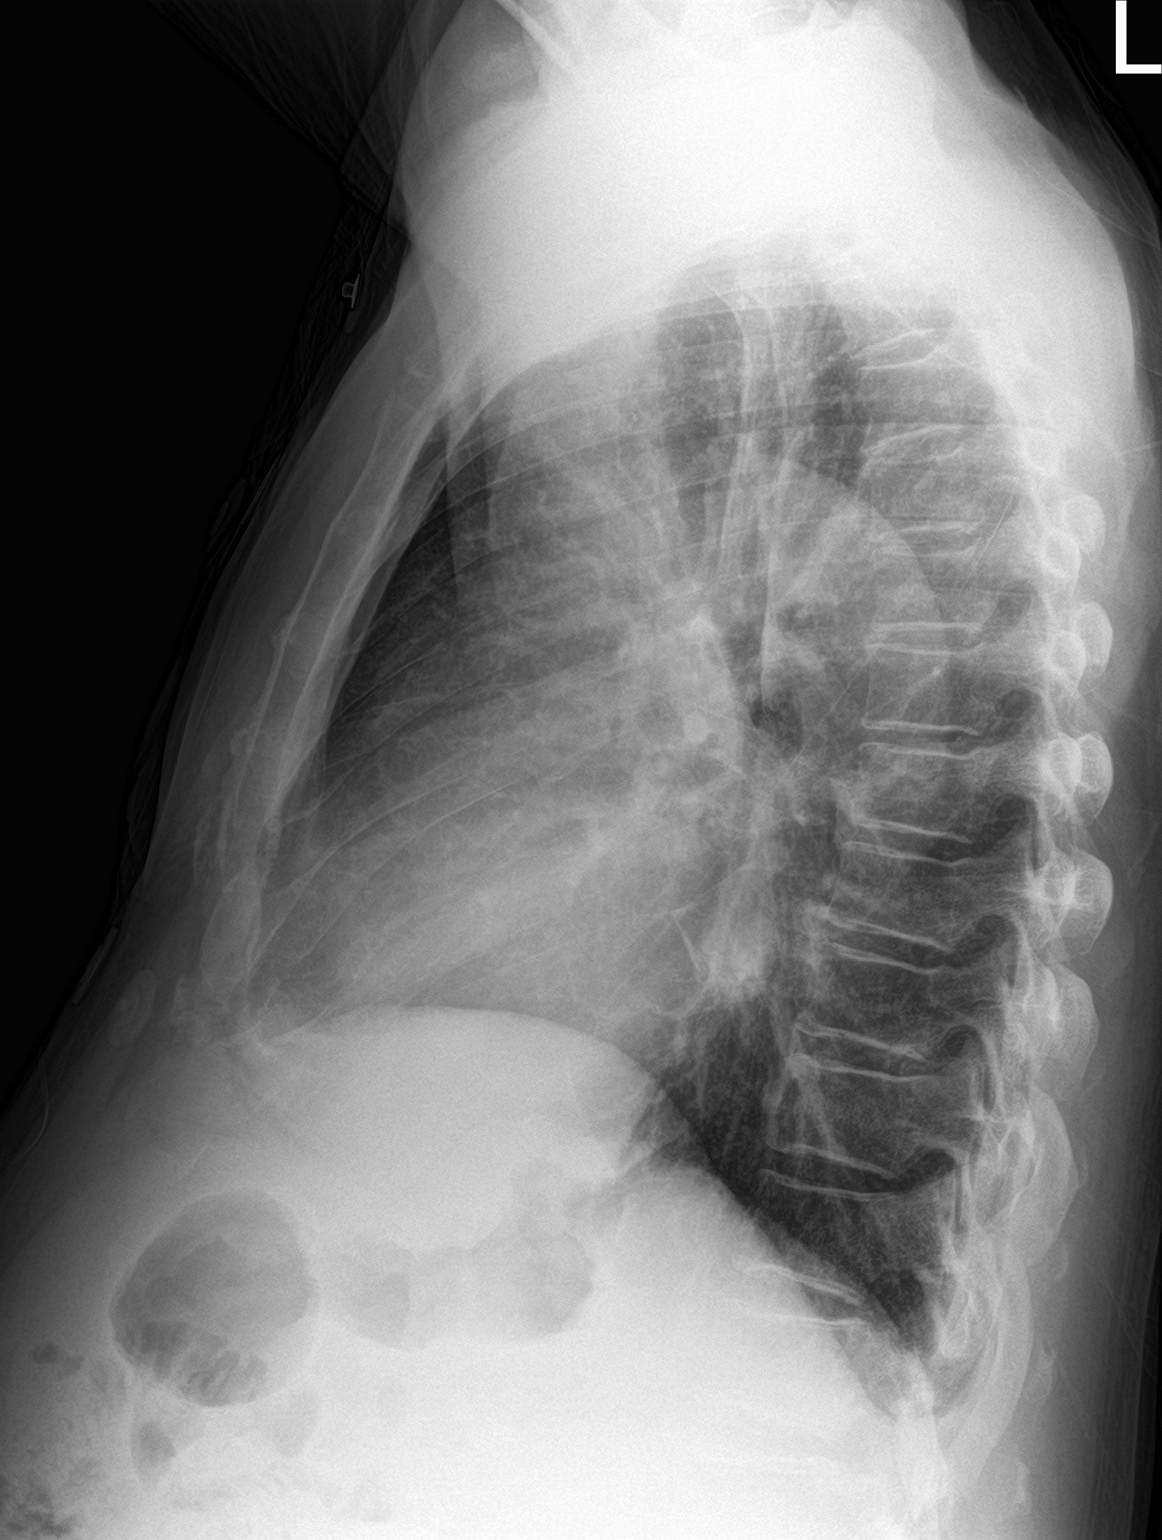

[2 of 2 positions shown; findings below may reference images not displayed]

FINDINGS: There is no evidence of acute infiltrate, pleural effusion or
pneumothorax. Persistent prominence of the right perihilar pulmonary
vasculature is noted. The cardiac silhouette is borderline in size.
The visualized skeletal structures are unremarkable.
IMPRESSION: Stable exam without active cardiopulmonary disease.

## 2021-07-27 ENCOUNTER — Inpatient Hospital Stay (HOSPITAL_COMMUNITY)
Admission: EM | Admit: 2021-07-27 | Discharge: 2021-08-14 | DRG: 308 | Disposition: A | Discharge status: Rehabilitation Facility | Payer: Medicare PPO | Attending: Family Medicine | Admitting: Family Medicine

## 2021-07-27 ENCOUNTER — Emergency Department (HOSPITAL_COMMUNITY): Payer: Medicare PPO

## 2021-07-27 ENCOUNTER — Encounter (HOSPITAL_COMMUNITY): Payer: Self-pay

## 2021-07-27 DIAGNOSIS — I480 Paroxysmal atrial fibrillation: Secondary | ICD-10-CM | POA: Diagnosis not present

## 2021-07-27 DIAGNOSIS — E872 Acidosis, unspecified: Secondary | ICD-10-CM | POA: Diagnosis not present

## 2021-07-27 DIAGNOSIS — I5020 Unspecified systolic (congestive) heart failure: Secondary | ICD-10-CM | POA: Diagnosis not present

## 2021-07-27 DIAGNOSIS — M96A1 Fracture of sternum associated with chest compression and cardiopulmonary resuscitation: Secondary | ICD-10-CM | POA: Diagnosis present

## 2021-07-27 DIAGNOSIS — R9389 Abnormal findings on diagnostic imaging of other specified body structures: Secondary | ICD-10-CM

## 2021-07-27 DIAGNOSIS — E875 Hyperkalemia: Secondary | ICD-10-CM | POA: Diagnosis not present

## 2021-07-27 DIAGNOSIS — I5043 Acute on chronic combined systolic (congestive) and diastolic (congestive) heart failure: Secondary | ICD-10-CM | POA: Diagnosis not present

## 2021-07-27 DIAGNOSIS — I469 Cardiac arrest, cause unspecified: Principal | ICD-10-CM

## 2021-07-27 DIAGNOSIS — Z91199 Patient's noncompliance with other medical treatment and regimen due to unspecified reason: Secondary | ICD-10-CM

## 2021-07-27 DIAGNOSIS — U071 COVID-19: Secondary | ICD-10-CM | POA: Diagnosis present

## 2021-07-27 DIAGNOSIS — E785 Hyperlipidemia, unspecified: Secondary | ICD-10-CM | POA: Diagnosis present

## 2021-07-27 DIAGNOSIS — J9601 Acute respiratory failure with hypoxia: Secondary | ICD-10-CM | POA: Diagnosis present

## 2021-07-27 DIAGNOSIS — N185 Chronic kidney disease, stage 5: Secondary | ICD-10-CM | POA: Diagnosis not present

## 2021-07-27 DIAGNOSIS — G9340 Encephalopathy, unspecified: Secondary | ICD-10-CM | POA: Diagnosis not present

## 2021-07-27 DIAGNOSIS — R946 Abnormal results of thyroid function studies: Secondary | ICD-10-CM | POA: Diagnosis not present

## 2021-07-27 DIAGNOSIS — E440 Moderate protein-calorie malnutrition: Secondary | ICD-10-CM | POA: Diagnosis not present

## 2021-07-27 DIAGNOSIS — M96A3 Multiple fractures of ribs associated with chest compression and cardiopulmonary resuscitation: Secondary | ICD-10-CM | POA: Diagnosis present

## 2021-07-27 DIAGNOSIS — R4701 Aphasia: Secondary | ICD-10-CM | POA: Diagnosis present

## 2021-07-27 DIAGNOSIS — D72829 Elevated white blood cell count, unspecified: Secondary | ICD-10-CM

## 2021-07-27 DIAGNOSIS — J15211 Pneumonia due to Methicillin susceptible Staphylococcus aureus: Secondary | ICD-10-CM | POA: Diagnosis not present

## 2021-07-27 DIAGNOSIS — R0602 Shortness of breath: Secondary | ICD-10-CM

## 2021-07-27 DIAGNOSIS — E1122 Type 2 diabetes mellitus with diabetic chronic kidney disease: Secondary | ICD-10-CM | POA: Diagnosis present

## 2021-07-27 DIAGNOSIS — I428 Other cardiomyopathies: Secondary | ICD-10-CM | POA: Diagnosis present

## 2021-07-27 DIAGNOSIS — R7989 Other specified abnormal findings of blood chemistry: Secondary | ICD-10-CM | POA: Diagnosis not present

## 2021-07-27 DIAGNOSIS — S2249XA Multiple fractures of ribs, unspecified side, initial encounter for closed fracture: Secondary | ICD-10-CM

## 2021-07-27 DIAGNOSIS — Z7901 Long term (current) use of anticoagulants: Secondary | ICD-10-CM

## 2021-07-27 DIAGNOSIS — Z8616 Personal history of COVID-19: Secondary | ICD-10-CM | POA: Diagnosis not present

## 2021-07-27 DIAGNOSIS — I132 Hypertensive heart and chronic kidney disease with heart failure and with stage 5 chronic kidney disease, or end stage renal disease: Secondary | ICD-10-CM | POA: Diagnosis present

## 2021-07-27 DIAGNOSIS — J690 Pneumonitis due to inhalation of food and vomit: Secondary | ICD-10-CM | POA: Diagnosis not present

## 2021-07-27 DIAGNOSIS — Z781 Physical restraint status: Secondary | ICD-10-CM

## 2021-07-27 DIAGNOSIS — I083 Combined rheumatic disorders of mitral, aortic and tricuspid valves: Secondary | ICD-10-CM | POA: Diagnosis present

## 2021-07-27 DIAGNOSIS — N170 Acute kidney failure with tubular necrosis: Secondary | ICD-10-CM | POA: Diagnosis present

## 2021-07-27 DIAGNOSIS — E878 Other disorders of electrolyte and fluid balance, not elsewhere classified: Secondary | ICD-10-CM

## 2021-07-27 DIAGNOSIS — I509 Heart failure, unspecified: Secondary | ICD-10-CM | POA: Diagnosis not present

## 2021-07-27 DIAGNOSIS — N179 Acute kidney failure, unspecified: Secondary | ICD-10-CM | POA: Diagnosis not present

## 2021-07-27 DIAGNOSIS — G931 Anoxic brain damage, not elsewhere classified: Secondary | ICD-10-CM | POA: Diagnosis present

## 2021-07-27 DIAGNOSIS — J189 Pneumonia, unspecified organism: Secondary | ICD-10-CM | POA: Diagnosis not present

## 2021-07-27 DIAGNOSIS — T1490XA Injury, unspecified, initial encounter: Secondary | ICD-10-CM

## 2021-07-27 DIAGNOSIS — E119 Type 2 diabetes mellitus without complications: Secondary | ICD-10-CM | POA: Diagnosis not present

## 2021-07-27 DIAGNOSIS — I5023 Acute on chronic systolic (congestive) heart failure: Secondary | ICD-10-CM | POA: Diagnosis present

## 2021-07-27 DIAGNOSIS — I482 Chronic atrial fibrillation, unspecified: Secondary | ICD-10-CM | POA: Diagnosis not present

## 2021-07-27 DIAGNOSIS — R739 Hyperglycemia, unspecified: Secondary | ICD-10-CM | POA: Diagnosis not present

## 2021-07-27 DIAGNOSIS — Z8249 Family history of ischemic heart disease and other diseases of the circulatory system: Secondary | ICD-10-CM

## 2021-07-27 DIAGNOSIS — R7881 Bacteremia: Secondary | ICD-10-CM

## 2021-07-27 DIAGNOSIS — J9600 Acute respiratory failure, unspecified whether with hypoxia or hypercapnia: Secondary | ICD-10-CM | POA: Diagnosis not present

## 2021-07-27 DIAGNOSIS — Y92410 Unspecified street and highway as the place of occurrence of the external cause: Secondary | ICD-10-CM | POA: Diagnosis not present

## 2021-07-27 DIAGNOSIS — A4101 Sepsis due to Methicillin susceptible Staphylococcus aureus: Secondary | ICD-10-CM | POA: Diagnosis not present

## 2021-07-27 DIAGNOSIS — I7121 Aneurysm of the ascending aorta, without rupture: Secondary | ICD-10-CM | POA: Diagnosis present

## 2021-07-27 DIAGNOSIS — Z6828 Body mass index (BMI) 28.0-28.9, adult: Secondary | ICD-10-CM

## 2021-07-27 DIAGNOSIS — Z7189 Other specified counseling: Secondary | ICD-10-CM | POA: Diagnosis not present

## 2021-07-27 DIAGNOSIS — I4892 Unspecified atrial flutter: Secondary | ICD-10-CM | POA: Diagnosis not present

## 2021-07-27 DIAGNOSIS — Z888 Allergy status to other drugs, medicaments and biological substances status: Secondary | ICD-10-CM

## 2021-07-27 DIAGNOSIS — Y92239 Unspecified place in hospital as the place of occurrence of the external cause: Secondary | ICD-10-CM | POA: Diagnosis not present

## 2021-07-27 DIAGNOSIS — E871 Hypo-osmolality and hyponatremia: Secondary | ICD-10-CM | POA: Diagnosis not present

## 2021-07-27 DIAGNOSIS — R34 Anuria and oliguria: Secondary | ICD-10-CM | POA: Diagnosis present

## 2021-07-27 DIAGNOSIS — S2222XA Fracture of body of sternum, initial encounter for closed fracture: Secondary | ICD-10-CM | POA: Diagnosis not present

## 2021-07-27 DIAGNOSIS — Z811 Family history of alcohol abuse and dependence: Secondary | ICD-10-CM

## 2021-07-27 DIAGNOSIS — I872 Venous insufficiency (chronic) (peripheral): Secondary | ICD-10-CM | POA: Diagnosis present

## 2021-07-27 DIAGNOSIS — I272 Pulmonary hypertension, unspecified: Secondary | ICD-10-CM | POA: Diagnosis present

## 2021-07-27 DIAGNOSIS — N184 Chronic kidney disease, stage 4 (severe): Secondary | ICD-10-CM | POA: Diagnosis not present

## 2021-07-27 DIAGNOSIS — R443 Hallucinations, unspecified: Secondary | ICD-10-CM | POA: Diagnosis not present

## 2021-07-27 DIAGNOSIS — Z515 Encounter for palliative care: Secondary | ICD-10-CM

## 2021-07-27 DIAGNOSIS — S2241XA Multiple fractures of ribs, right side, initial encounter for closed fracture: Secondary | ICD-10-CM | POA: Diagnosis not present

## 2021-07-27 DIAGNOSIS — Z992 Dependence on renal dialysis: Secondary | ICD-10-CM | POA: Diagnosis not present

## 2021-07-27 DIAGNOSIS — I462 Cardiac arrest due to underlying cardiac condition: Secondary | ICD-10-CM | POA: Diagnosis present

## 2021-07-27 DIAGNOSIS — Z9114 Patient's other noncompliance with medication regimen: Secondary | ICD-10-CM

## 2021-07-27 DIAGNOSIS — E113299 Type 2 diabetes mellitus with mild nonproliferative diabetic retinopathy without macular edema, unspecified eye: Secondary | ICD-10-CM | POA: Diagnosis not present

## 2021-07-27 DIAGNOSIS — I4729 Other ventricular tachycardia: Secondary | ICD-10-CM | POA: Diagnosis not present

## 2021-07-27 DIAGNOSIS — R569 Unspecified convulsions: Secondary | ICD-10-CM | POA: Diagnosis not present

## 2021-07-27 DIAGNOSIS — N186 End stage renal disease: Secondary | ICD-10-CM | POA: Diagnosis not present

## 2021-07-27 DIAGNOSIS — I4901 Ventricular fibrillation: Principal | ICD-10-CM | POA: Diagnosis present

## 2021-07-27 DIAGNOSIS — D631 Anemia in chronic kidney disease: Secondary | ICD-10-CM | POA: Diagnosis present

## 2021-07-27 DIAGNOSIS — G9341 Metabolic encephalopathy: Secondary | ICD-10-CM | POA: Diagnosis present

## 2021-07-27 DIAGNOSIS — R471 Dysarthria and anarthria: Secondary | ICD-10-CM | POA: Diagnosis present

## 2021-07-27 DIAGNOSIS — E1165 Type 2 diabetes mellitus with hyperglycemia: Secondary | ICD-10-CM | POA: Diagnosis present

## 2021-07-27 DIAGNOSIS — R1310 Dysphagia, unspecified: Secondary | ICD-10-CM | POA: Diagnosis present

## 2021-07-27 DIAGNOSIS — Z79899 Other long term (current) drug therapy: Secondary | ICD-10-CM

## 2021-07-27 DIAGNOSIS — B9561 Methicillin susceptible Staphylococcus aureus infection as the cause of diseases classified elsewhere: Secondary | ICD-10-CM

## 2021-07-27 DIAGNOSIS — R413 Other amnesia: Secondary | ICD-10-CM | POA: Diagnosis present

## 2021-07-27 DIAGNOSIS — N2581 Secondary hyperparathyroidism of renal origin: Secondary | ICD-10-CM | POA: Diagnosis present

## 2021-07-27 DIAGNOSIS — Z833 Family history of diabetes mellitus: Secondary | ICD-10-CM

## 2021-07-27 DIAGNOSIS — I4510 Unspecified right bundle-branch block: Secondary | ICD-10-CM | POA: Diagnosis present

## 2021-07-27 DIAGNOSIS — T380X5A Adverse effect of glucocorticoids and synthetic analogues, initial encounter: Secondary | ICD-10-CM | POA: Diagnosis not present

## 2021-07-27 DIAGNOSIS — I472 Ventricular tachycardia, unspecified: Secondary | ICD-10-CM | POA: Diagnosis present

## 2021-07-27 HISTORY — DX: Cardiac arrest, cause unspecified: I46.9

## 2021-07-27 LAB — I-STAT CHEM 8, ED
BUN: 57 mg/dL — ABNORMAL HIGH (ref 8–23)
Calcium, Ion: 1.07 mmol/L — ABNORMAL LOW (ref 1.15–1.40)
Chloride: 104 mmol/L (ref 98–111)
Creatinine, Ser: 3.7 mg/dL — ABNORMAL HIGH (ref 0.61–1.24)
Glucose, Bld: 203 mg/dL — ABNORMAL HIGH (ref 70–99)
HCT: 32 % — ABNORMAL LOW (ref 39.0–52.0)
Hemoglobin: 10.9 g/dL — ABNORMAL LOW (ref 13.0–17.0)
Potassium: 4.6 mmol/L (ref 3.5–5.1)
Sodium: 140 mmol/L (ref 135–145)
TCO2: 25 mmol/L (ref 22–32)

## 2021-07-27 LAB — COMPREHENSIVE METABOLIC PANEL
ALT: 69 U/L — ABNORMAL HIGH (ref 0–44)
AST: 92 U/L — ABNORMAL HIGH (ref 15–41)
Albumin: 3.1 g/dL — ABNORMAL LOW (ref 3.5–5.0)
Alkaline Phosphatase: 99 U/L (ref 38–126)
Anion gap: 10 (ref 5–15)
BUN: 51 mg/dL — ABNORMAL HIGH (ref 8–23)
CO2: 22 mmol/L (ref 22–32)
Calcium: 8.1 mg/dL — ABNORMAL LOW (ref 8.9–10.3)
Chloride: 105 mmol/L (ref 98–111)
Creatinine, Ser: 3.71 mg/dL — ABNORMAL HIGH (ref 0.61–1.24)
GFR, Estimated: 17 mL/min — ABNORMAL LOW (ref >60)
Glucose, Bld: 211 mg/dL — ABNORMAL HIGH (ref 70–99)
Potassium: 4.4 mmol/L (ref 3.5–5.1)
Sodium: 137 mmol/L (ref 135–145)
Total Bilirubin: 0.7 mg/dL (ref 0.3–1.2)
Total Protein: 7.3 g/dL (ref 6.5–8.1)

## 2021-07-27 LAB — SAMPLE TO BLOOD BANK

## 2021-07-27 LAB — CBC
HCT: 28.8 % — ABNORMAL LOW (ref 39.0–52.0)
Hemoglobin: 8.9 g/dL — ABNORMAL LOW (ref 13.0–17.0)
MCH: 28.4 pg (ref 26.0–34.0)
MCHC: 30.9 g/dL (ref 30.0–36.0)
MCV: 92 fL (ref 80.0–100.0)
Platelets: 169 10*3/uL (ref 150–400)
RBC: 3.13 MIL/uL — ABNORMAL LOW (ref 4.22–5.81)
RDW: 15.9 % — ABNORMAL HIGH (ref 11.5–15.5)
WBC: 7.2 10*3/uL (ref 4.0–10.5)
nRBC: 0 % (ref 0.0–0.2)

## 2021-07-27 LAB — RESP PANEL BY RT-PCR (FLU A&B, COVID) ARPGX2
Influenza A by PCR: NEGATIVE
Influenza B by PCR: NEGATIVE
SARS Coronavirus 2 by RT PCR: POSITIVE — AB

## 2021-07-27 LAB — I-STAT ARTERIAL BLOOD GAS, ED
Acid-Base Excess: 1 mmol/L (ref 0.0–2.0)
Bicarbonate: 25.7 mmol/L (ref 20.0–28.0)
Calcium, Ion: 1.15 mmol/L (ref 1.15–1.40)
HCT: 25 % — ABNORMAL LOW (ref 39.0–52.0)
Hemoglobin: 8.5 g/dL — ABNORMAL LOW (ref 13.0–17.0)
O2 Saturation: 100 %
Patient temperature: 97.4
Potassium: 4 mmol/L (ref 3.5–5.1)
Sodium: 140 mmol/L (ref 135–145)
TCO2: 27 mmol/L (ref 22–32)
pCO2 arterial: 38.7 mmHg (ref 32–48)
pH, Arterial: 7.427 (ref 7.35–7.45)
pO2, Arterial: 378 mmHg — ABNORMAL HIGH (ref 83–108)

## 2021-07-27 LAB — PROTIME-INR
INR: 1.5 — ABNORMAL HIGH (ref 0.8–1.2)
Prothrombin Time: 18.4 seconds — ABNORMAL HIGH (ref 11.4–15.2)

## 2021-07-27 LAB — LACTIC ACID, PLASMA: Lactic Acid, Venous: 1.7 mmol/L (ref 0.5–1.9)

## 2021-07-27 LAB — ETHANOL: Alcohol, Ethyl (B): <10 mg/dL (ref <10)

## 2021-07-27 MED ORDER — PROPOFOL 1000 MG/100ML IV EMUL: 5.0000 ug/kg/min | INTRAVENOUS | Status: DC

## 2021-07-27 MED ORDER — HEPARIN SODIUM (PORCINE) 5000 UNIT/ML IJ SOLN: 5000.0000 [IU] | Freq: Three times a day (TID) | INTRAMUSCULAR | Status: DC

## 2021-07-27 MED ORDER — MIDAZOLAM HCL 2 MG/2ML IJ SOLN: 1.0000 mg | INTRAMUSCULAR | Status: DC | PRN

## 2021-07-27 MED ORDER — IOHEXOL 350 MG/ML SOLN
100.0000 mL | Freq: Once | INTRAVENOUS | Status: AC | PRN
Start: 2021-07-27 — End: 2021-07-27
  Administered 2021-07-27: 100 mL via INTRAVENOUS

## 2021-07-27 MED ORDER — PANTOPRAZOLE 2 MG/ML SUSPENSION: 40.0000 mg | Freq: Every day | ORAL | Status: DC

## 2021-07-27 MED ORDER — FENTANYL 2500MCG IN NS 250ML (10MCG/ML) PREMIX INFUSION
0.0000 ug/h | INTRAVENOUS | Status: DC
  Administered 2021-07-27: 100 ug/h via INTRAVENOUS
  Filled 2021-07-27: qty 250

## 2021-07-27 MED ORDER — POLYETHYLENE GLYCOL 3350 17 G PO PACK
17.0000 g | PACK | Freq: Every day | ORAL | Status: DC | PRN
  Administered 2021-07-29 – 2021-07-31 (×2): 17 g
  Filled 2021-07-27 (×2): qty 1

## 2021-07-27 MED ORDER — DOCUSATE SODIUM 50 MG/5ML PO LIQD
100.0000 mg | Freq: Two times a day (BID) | ORAL | Status: DC | PRN
  Administered 2021-07-30 – 2021-07-31 (×2): 100 mg
  Filled 2021-07-27 (×3): qty 10

## 2021-07-27 MED ORDER — PROPOFOL 1000 MG/100ML IV EMUL
INTRAVENOUS | Status: AC
  Administered 2021-07-27: 10 ug/kg/min via INTRAVENOUS
  Filled 2021-07-27: qty 100

## 2021-07-28 ENCOUNTER — Inpatient Hospital Stay (HOSPITAL_COMMUNITY): Payer: Medicare PPO

## 2021-07-28 DIAGNOSIS — I469 Cardiac arrest, cause unspecified: Secondary | ICD-10-CM

## 2021-07-28 DIAGNOSIS — E878 Other disorders of electrolyte and fluid balance, not elsewhere classified: Secondary | ICD-10-CM

## 2021-07-28 DIAGNOSIS — I4729 Other ventricular tachycardia: Secondary | ICD-10-CM

## 2021-07-28 DIAGNOSIS — I5020 Unspecified systolic (congestive) heart failure: Secondary | ICD-10-CM

## 2021-07-28 DIAGNOSIS — G9340 Encephalopathy, unspecified: Secondary | ICD-10-CM | POA: Diagnosis present

## 2021-07-28 DIAGNOSIS — U071 COVID-19: Secondary | ICD-10-CM | POA: Insufficient documentation

## 2021-07-28 DIAGNOSIS — R739 Hyperglycemia, unspecified: Secondary | ICD-10-CM

## 2021-07-28 DIAGNOSIS — R569 Unspecified convulsions: Secondary | ICD-10-CM

## 2021-07-28 DIAGNOSIS — T1490XA Injury, unspecified, initial encounter: Secondary | ICD-10-CM

## 2021-07-28 DIAGNOSIS — J9600 Acute respiratory failure, unspecified whether with hypoxia or hypercapnia: Secondary | ICD-10-CM | POA: Insufficient documentation

## 2021-07-28 DIAGNOSIS — R9389 Abnormal findings on diagnostic imaging of other specified body structures: Secondary | ICD-10-CM

## 2021-07-28 DIAGNOSIS — R7989 Other specified abnormal findings of blood chemistry: Secondary | ICD-10-CM

## 2021-07-28 DIAGNOSIS — N184 Chronic kidney disease, stage 4 (severe): Secondary | ICD-10-CM

## 2021-07-28 DIAGNOSIS — I509 Heart failure, unspecified: Secondary | ICD-10-CM

## 2021-07-28 DIAGNOSIS — J9601 Acute respiratory failure with hypoxia: Secondary | ICD-10-CM | POA: Diagnosis not present

## 2021-07-28 DIAGNOSIS — S2249XA Multiple fractures of ribs, unspecified side, initial encounter for closed fracture: Secondary | ICD-10-CM

## 2021-07-28 DIAGNOSIS — I482 Chronic atrial fibrillation, unspecified: Secondary | ICD-10-CM

## 2021-07-28 HISTORY — DX: Acute respiratory failure with hypoxia: J96.01

## 2021-07-28 LAB — GLUCOSE, CAPILLARY
Glucose-Capillary: 213 mg/dL — ABNORMAL HIGH (ref 70–99)
Glucose-Capillary: 173 mg/dL — ABNORMAL HIGH (ref 70–99)
Glucose-Capillary: 138 mg/dL — ABNORMAL HIGH (ref 70–99)
Glucose-Capillary: 166 mg/dL — ABNORMAL HIGH (ref 70–99)
Glucose-Capillary: 197 mg/dL — ABNORMAL HIGH (ref 70–99)
Glucose-Capillary: 188 mg/dL — ABNORMAL HIGH (ref 70–99)
Glucose-Capillary: 191 mg/dL — ABNORMAL HIGH (ref 70–99)

## 2021-07-28 LAB — CBC
HCT: 28.5 % — ABNORMAL LOW (ref 39.0–52.0)
Hemoglobin: 9.2 g/dL — ABNORMAL LOW (ref 13.0–17.0)
MCH: 28.2 pg (ref 26.0–34.0)
MCHC: 32.3 g/dL (ref 30.0–36.0)
MCV: 87.4 fL (ref 80.0–100.0)
Platelets: 142 10*3/uL — ABNORMAL LOW (ref 150–400)
RBC: 3.26 MIL/uL — ABNORMAL LOW (ref 4.22–5.81)
RDW: 15.8 % — ABNORMAL HIGH (ref 11.5–15.5)
WBC: 10.5 10*3/uL (ref 4.0–10.5)
nRBC: 0 % (ref 0.0–0.2)

## 2021-07-28 LAB — BASIC METABOLIC PANEL
Anion gap: 13 (ref 5–15)
BUN: 53 mg/dL — ABNORMAL HIGH (ref 8–23)
CO2: 21 mmol/L — ABNORMAL LOW (ref 22–32)
Calcium: 8.3 mg/dL — ABNORMAL LOW (ref 8.9–10.3)
Chloride: 104 mmol/L (ref 98–111)
Creatinine, Ser: 3.4 mg/dL — ABNORMAL HIGH (ref 0.61–1.24)
GFR, Estimated: 19 mL/min — ABNORMAL LOW (ref >60)
Glucose, Bld: 174 mg/dL — ABNORMAL HIGH (ref 70–99)
Potassium: 4.2 mmol/L (ref 3.5–5.1)
Sodium: 138 mmol/L (ref 135–145)

## 2021-07-28 LAB — ECHOCARDIOGRAM COMPLETE
Area-P 1/2: 4.48 cm2
MV M vel: 4.33 m/s
MV Peak grad: 75 mmHg
Radius: 0.4 cm
S' Lateral: 5 cm
Weight: 3065.28 oz

## 2021-07-28 LAB — URINALYSIS, ROUTINE W REFLEX MICROSCOPIC
Bacteria, UA: NONE SEEN
Bilirubin Urine: NEGATIVE
Glucose, UA: NEGATIVE mg/dL
Ketones, ur: NEGATIVE mg/dL
Leukocytes,Ua: NEGATIVE
Nitrite: NEGATIVE
Protein, ur: 100 mg/dL — AB
Specific Gravity, Urine: 1.013 (ref 1.005–1.030)
pH: 5 (ref 5.0–8.0)

## 2021-07-28 LAB — TYPE AND SCREEN
ABO/RH(D): B POS
Antibody Screen: NEGATIVE

## 2021-07-28 LAB — PROCALCITONIN: Procalcitonin: <0.1 ng/mL

## 2021-07-28 LAB — PHOSPHORUS: Phosphorus: 3.6 mg/dL (ref 2.5–4.6)

## 2021-07-28 LAB — HEMOGLOBIN A1C
Hgb A1c MFr Bld: 7.5 % — ABNORMAL HIGH (ref 4.8–5.6)
Mean Plasma Glucose: 169 mg/dL

## 2021-07-28 LAB — TRIGLYCERIDES: Triglycerides: 40 mg/dL (ref <150)

## 2021-07-28 LAB — MAGNESIUM: Magnesium: 1.6 mg/dL — ABNORMAL LOW (ref 1.7–2.4)

## 2021-07-28 LAB — FERRITIN: Ferritin: 176 ng/mL (ref 24–336)

## 2021-07-28 LAB — TSH: TSH: 6.089 u[IU]/mL — ABNORMAL HIGH (ref 0.350–4.500)

## 2021-07-28 LAB — HEMOGLOBIN AND HEMATOCRIT, BLOOD
HCT: 30.4 % — ABNORMAL LOW (ref 39.0–52.0)
HCT: 27.6 % — ABNORMAL LOW (ref 39.0–52.0)
Hemoglobin: 9.4 g/dL — ABNORMAL LOW (ref 13.0–17.0)
Hemoglobin: 8.7 g/dL — ABNORMAL LOW (ref 13.0–17.0)

## 2021-07-28 LAB — MRSA NEXT GEN BY PCR, NASAL: MRSA by PCR Next Gen: NOT DETECTED

## 2021-07-28 LAB — C-REACTIVE PROTEIN: CRP: 1.2 mg/dL — ABNORMAL HIGH (ref <1.0)

## 2021-07-28 LAB — LACTATE DEHYDROGENASE: LDH: 402 U/L — ABNORMAL HIGH (ref 98–192)

## 2021-07-28 LAB — TROPONIN I (HIGH SENSITIVITY)
Troponin I (High Sensitivity): 117 ng/L (ref <18)
Troponin I (High Sensitivity): 308 ng/L (ref <18)

## 2021-07-28 LAB — ABO/RH: ABO/RH(D): B POS

## 2021-07-28 LAB — BRAIN NATRIURETIC PEPTIDE: B Natriuretic Peptide: 2049.8 pg/mL — ABNORMAL HIGH (ref 0.0–100.0)

## 2021-07-28 LAB — D-DIMER, QUANTITATIVE: D-Dimer, Quant: 14.4 ug/mL-FEU — ABNORMAL HIGH (ref 0.00–0.50)

## 2021-07-28 LAB — FIBRINOGEN: Fibrinogen: 350 mg/dL (ref 210–475)

## 2021-07-28 MED ORDER — MOLNUPIRAVIR EUA 200MG CAPSULE
4.0000 | ORAL_CAPSULE | Freq: Two times a day (BID) | ORAL | Status: AC
  Administered 2021-07-28 – 2021-08-01 (×10): 800 mg via ORAL
  Filled 2021-07-28 (×3): qty 4

## 2021-07-28 MED ORDER — ORAL CARE MOUTH RINSE
15.0000 mL | OROMUCOSAL | Status: DC
  Administered 2021-07-28 (×5): 15 mL via OROMUCOSAL

## 2021-07-28 MED ORDER — ONDANSETRON HCL 4 MG/2ML IJ SOLN: 4.0000 mg | Freq: Four times a day (QID) | INTRAMUSCULAR | Status: DC

## 2021-07-28 MED ORDER — HYDRALAZINE HCL 25 MG PO TABS
25.0000 mg | ORAL_TABLET | Freq: Three times a day (TID) | ORAL | Status: DC
  Administered 2021-07-28 – 2021-07-29 (×3): 25 mg via ORAL
  Filled 2021-07-28 (×3): qty 1

## 2021-07-28 MED ORDER — AMIODARONE HCL IN DEXTROSE 360-4.14 MG/200ML-% IV SOLN
60.0000 mg/h | INTRAVENOUS | Status: AC
  Administered 2021-07-28 (×2): 60 mg/h via INTRAVENOUS
  Filled 2021-07-28 (×2): qty 200

## 2021-07-28 MED ORDER — CHLORHEXIDINE GLUCONATE 0.12% ORAL RINSE (MEDLINE KIT)
15.0000 mL | Freq: Two times a day (BID) | OROMUCOSAL | Status: DC
  Administered 2021-07-28: 15 mL via OROMUCOSAL

## 2021-07-28 MED ORDER — ONDANSETRON HCL 4 MG/2ML IJ SOLN
4.0000 mg | Freq: Four times a day (QID) | INTRAMUSCULAR | Status: DC | PRN
  Administered 2021-07-30: 4 mg via INTRAVENOUS
  Filled 2021-07-28: qty 2

## 2021-07-28 MED ORDER — FUROSEMIDE 10 MG/ML IJ SOLN
60.0000 mg | Freq: Once | INTRAMUSCULAR | Status: AC
  Administered 2021-07-28: 60 mg via INTRAVENOUS
  Filled 2021-07-28: qty 6

## 2021-07-28 MED ORDER — AMIODARONE HCL IN DEXTROSE 360-4.14 MG/200ML-% IV SOLN
30.0000 mg/h | INTRAVENOUS | Status: DC
  Administered 2021-07-28 – 2021-08-02 (×9): 30 mg/h via INTRAVENOUS
  Filled 2021-07-28 (×9): qty 200

## 2021-07-28 MED ORDER — LABETALOL HCL 5 MG/ML IV SOLN
5.0000 mg | Freq: Once | INTRAVENOUS | Status: DC
Start: 2021-07-28 — End: 2021-07-28

## 2021-07-28 MED ORDER — AMIODARONE LOAD VIA INFUSION
150.0000 mg | Freq: Once | INTRAVENOUS | Status: AC
  Administered 2021-07-28: 150 mg via INTRAVENOUS
  Filled 2021-07-28: qty 83.34

## 2021-07-28 MED ORDER — ORAL CARE MOUTH RINSE
15.0000 mL | Freq: Two times a day (BID) | OROMUCOSAL | Status: DC
  Administered 2021-07-28 (×2): 15 mL via OROMUCOSAL

## 2021-07-28 MED ORDER — ISOSORBIDE MONONITRATE ER 30 MG PO TB24
30.0000 mg | ORAL_TABLET | Freq: Every day | ORAL | Status: DC
  Administered 2021-07-28 – 2021-07-29 (×2): 30 mg via ORAL
  Filled 2021-07-28 (×2): qty 1

## 2021-07-28 MED ORDER — PANTOPRAZOLE SODIUM 40 MG IV SOLR
40.0000 mg | Freq: Two times a day (BID) | INTRAVENOUS | Status: DC
  Administered 2021-07-28: 40 mg via INTRAVENOUS
  Filled 2021-07-28: qty 10

## 2021-07-28 MED ORDER — DEXAMETHASONE SODIUM PHOSPHATE 10 MG/ML IJ SOLN
6.0000 mg | Freq: Every day | INTRAMUSCULAR | Status: DC
  Administered 2021-07-28 – 2021-07-29 (×3): 6 mg via INTRAVENOUS
  Filled 2021-07-28 (×5): qty 0.6

## 2021-07-28 MED ORDER — HYDRALAZINE HCL 20 MG/ML IJ SOLN: 10.0000 mg | Freq: Four times a day (QID) | INTRAMUSCULAR | Status: DC | PRN

## 2021-07-28 MED ORDER — INSULIN ASPART 100 UNIT/ML IJ SOLN
0.0000 [IU] | INTRAMUSCULAR | Status: DC
  Administered 2021-07-28 (×3): 2 [IU] via SUBCUTANEOUS
  Administered 2021-07-28: 3 [IU] via SUBCUTANEOUS
  Administered 2021-07-28 (×2): 2 [IU] via SUBCUTANEOUS
  Administered 2021-07-28: 1 [IU] via SUBCUTANEOUS
  Administered 2021-07-29: 2 [IU] via SUBCUTANEOUS

## 2021-07-28 MED ORDER — MAGNESIUM SULFATE 2 GM/50ML IV SOLN
2.0000 g | Freq: Once | INTRAVENOUS | Status: AC
  Administered 2021-07-28: 2 g via INTRAVENOUS
  Filled 2021-07-28: qty 50

## 2021-07-28 MED ORDER — LABETALOL HCL 5 MG/ML IV SOLN
10.0000 mg | INTRAVENOUS | Status: AC | PRN
  Administered 2021-07-28: 10 mg via INTRAVENOUS
  Filled 2021-07-28: qty 4

## 2021-07-28 MED ORDER — APIXABAN 5 MG PO TABS
5.0000 mg | ORAL_TABLET | Freq: Two times a day (BID) | ORAL | Status: DC
  Administered 2021-07-28 – 2021-08-02 (×11): 5 mg
  Filled 2021-07-28 (×11): qty 1

## 2021-07-28 MED ORDER — LIDOCAINE 5 % EX PTCH
2.0000 | MEDICATED_PATCH | CUTANEOUS | Status: DC
Start: 2021-07-28 — End: 2021-07-31
  Administered 2021-07-28 – 2021-07-31 (×4): 2 via TRANSDERMAL
  Filled 2021-07-28 (×4): qty 2

## 2021-07-28 MED ORDER — FUROSEMIDE 10 MG/ML IJ SOLN
40.0000 mg | Freq: Once | INTRAMUSCULAR | Status: AC
  Administered 2021-07-28: 40 mg via INTRAVENOUS
  Filled 2021-07-28: qty 4

## 2021-07-28 MED ORDER — CHLORHEXIDINE GLUCONATE CLOTH 2 % EX PADS
6.0000 | MEDICATED_PAD | Freq: Every day | CUTANEOUS | Status: DC
  Administered 2021-07-28 – 2021-07-30 (×3): 6 via TOPICAL

## 2021-07-28 NOTE — Procedures (Signed)
Extubation Procedure Note ? ?Patient Details:   ?Name: [name]. ?DOB: 09-26-1950 ?MRN: [id number] ?  ?Airway Documentation:  ?  ?Vent end date: 07/28/21 Vent end time: 1000  ? ?Evaluation ? O2 sats: stable throughout ?Complications: No apparent complications ?Patient did tolerate procedure well. ?Bilateral Breath Sounds: Clear, Diminished ?  ?Yes ? ?Extubated by PCCM, no complications. Pt tolerated procedure well. ? ?Esperanza Sheets T ?07/28/2021, 10:15 AM ? ?
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
## 2021-07-28 NOTE — Plan of Care (Signed)
Spoke with patient's son and significant other via phone and updated them on patient's condition and communicated guarded prognosis.  They would like to continue full code right now.  ? ?Otilio Carpen Greogry Goodwyn, PA-C ? ?

## 2021-07-28 NOTE — Progress Notes (Signed)
Attempted to wean fentanyl gtt to 75 mcg/hr, when mouth care done pt opened eyes (did not turn to voice or track), did not follow commands attempted to sit up in bed, pulling against restraints, reaching for ETT and moving bilat LE. Emotional support given, fentanyl back up to 100 mcg/hr. Calmed after a few minutes.  ?
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
## 2021-07-28 NOTE — Progress Notes (Signed)
EEG complete - results pending 
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
## 2021-07-28 NOTE — Progress Notes (Signed)
?  Echocardiogram ?2D Echocardiogram has been performed. ? [name] ?07/28/2021, 3:05 PM ?
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
## 2021-07-29 ENCOUNTER — Encounter (HOSPITAL_COMMUNITY): Payer: Self-pay | Admitting: Internal Medicine

## 2021-07-29 ENCOUNTER — Other Ambulatory Visit (HOSPITAL_COMMUNITY): Payer: Self-pay

## 2021-07-29 DIAGNOSIS — S2249XA Multiple fractures of ribs, unspecified side, initial encounter for closed fracture: Secondary | ICD-10-CM

## 2021-07-29 DIAGNOSIS — D72829 Elevated white blood cell count, unspecified: Secondary | ICD-10-CM

## 2021-07-29 DIAGNOSIS — I469 Cardiac arrest, cause unspecified: Secondary | ICD-10-CM | POA: Diagnosis not present

## 2021-07-29 DIAGNOSIS — S2241XA Multiple fractures of ribs, right side, initial encounter for closed fracture: Secondary | ICD-10-CM | POA: Diagnosis not present

## 2021-07-29 DIAGNOSIS — Z515 Encounter for palliative care: Secondary | ICD-10-CM

## 2021-07-29 DIAGNOSIS — S2222XA Fracture of body of sternum, initial encounter for closed fracture: Secondary | ICD-10-CM

## 2021-07-29 DIAGNOSIS — J9601 Acute respiratory failure with hypoxia: Secondary | ICD-10-CM | POA: Diagnosis not present

## 2021-07-29 DIAGNOSIS — R9389 Abnormal findings on diagnostic imaging of other specified body structures: Secondary | ICD-10-CM | POA: Diagnosis not present

## 2021-07-29 DIAGNOSIS — Z7189 Other specified counseling: Secondary | ICD-10-CM

## 2021-07-29 DIAGNOSIS — I5023 Acute on chronic systolic (congestive) heart failure: Secondary | ICD-10-CM

## 2021-07-29 DIAGNOSIS — I5043 Acute on chronic combined systolic (congestive) and diastolic (congestive) heart failure: Secondary | ICD-10-CM

## 2021-07-29 LAB — CBC WITH DIFFERENTIAL/PLATELET
Abs Immature Granulocytes: 0.07 10*3/uL (ref 0.00–0.07)
Basophils Absolute: 0 10*3/uL (ref 0.0–0.1)
Basophils Relative: 0 %
Eosinophils Absolute: 0 10*3/uL (ref 0.0–0.5)
Eosinophils Relative: 0 %
HCT: 28.9 % — ABNORMAL LOW (ref 39.0–52.0)
Hemoglobin: 9.3 g/dL — ABNORMAL LOW (ref 13.0–17.0)
Immature Granulocytes: 1 %
Lymphocytes Relative: 6 %
Lymphs Abs: 0.9 10*3/uL (ref 0.7–4.0)
MCH: 28.1 pg (ref 26.0–34.0)
MCHC: 32.2 g/dL (ref 30.0–36.0)
MCV: 87.3 fL (ref 80.0–100.0)
Monocytes Absolute: 1.3 10*3/uL — ABNORMAL HIGH (ref 0.1–1.0)
Monocytes Relative: 9 %
Neutro Abs: 12 10*3/uL — ABNORMAL HIGH (ref 1.7–7.7)
Neutrophils Relative %: 84 %
Platelets: 164 10*3/uL (ref 150–400)
RBC: 3.31 MIL/uL — ABNORMAL LOW (ref 4.22–5.81)
RDW: 15.9 % — ABNORMAL HIGH (ref 11.5–15.5)
WBC: 14.2 10*3/uL — ABNORMAL HIGH (ref 4.0–10.5)
nRBC: 0 % (ref 0.0–0.2)

## 2021-07-29 LAB — BASIC METABOLIC PANEL
Anion gap: 13 (ref 5–15)
Anion gap: 13 (ref 5–15)
BUN: 55 mg/dL — ABNORMAL HIGH (ref 8–23)
BUN: 62 mg/dL — ABNORMAL HIGH (ref 8–23)
CO2: 21 mmol/L — ABNORMAL LOW (ref 22–32)
CO2: 20 mmol/L — ABNORMAL LOW (ref 22–32)
Calcium: 8.5 mg/dL — ABNORMAL LOW (ref 8.9–10.3)
Calcium: 8.5 mg/dL — ABNORMAL LOW (ref 8.9–10.3)
Chloride: 104 mmol/L (ref 98–111)
Chloride: 101 mmol/L (ref 98–111)
Creatinine, Ser: 3.48 mg/dL — ABNORMAL HIGH (ref 0.61–1.24)
Creatinine, Ser: 3.75 mg/dL — ABNORMAL HIGH (ref 0.61–1.24)
GFR, Estimated: 18 mL/min — ABNORMAL LOW (ref >60)
GFR, Estimated: 16 mL/min — ABNORMAL LOW (ref >60)
Glucose, Bld: 183 mg/dL — ABNORMAL HIGH (ref 70–99)
Glucose, Bld: 251 mg/dL — ABNORMAL HIGH (ref 70–99)
Potassium: 5.4 mmol/L — ABNORMAL HIGH (ref 3.5–5.1)
Potassium: 3.8 mmol/L (ref 3.5–5.1)
Sodium: 138 mmol/L (ref 135–145)
Sodium: 134 mmol/L — ABNORMAL LOW (ref 135–145)

## 2021-07-29 LAB — GLUCOSE, CAPILLARY
Glucose-Capillary: 187 mg/dL — ABNORMAL HIGH (ref 70–99)
Glucose-Capillary: 149 mg/dL — ABNORMAL HIGH (ref 70–99)
Glucose-Capillary: 252 mg/dL — ABNORMAL HIGH (ref 70–99)
Glucose-Capillary: 164 mg/dL — ABNORMAL HIGH (ref 70–99)
Glucose-Capillary: 174 mg/dL — ABNORMAL HIGH (ref 70–99)

## 2021-07-29 LAB — MAGNESIUM: Magnesium: 2.3 mg/dL (ref 1.7–2.4)

## 2021-07-29 MED ORDER — OXYCODONE HCL 5 MG PO TABS
5.0000 mg | ORAL_TABLET | ORAL | Status: AC | PRN
  Administered 2021-07-29 – 2021-07-30 (×10): 5 mg via ORAL
  Filled 2021-07-29 (×10): qty 1

## 2021-07-29 MED ORDER — TORSEMIDE 20 MG PO TABS
40.0000 mg | ORAL_TABLET | Freq: Every morning | ORAL | Status: DC
  Administered 2021-07-29 – 2021-07-30 (×2): 40 mg via ORAL
  Filled 2021-07-29: qty 2

## 2021-07-29 MED ORDER — ACETAMINOPHEN 500 MG PO TABS
1000.0000 mg | ORAL_TABLET | Freq: Three times a day (TID) | ORAL | Status: DC
Start: 2021-07-29 — End: 2021-08-14
  Administered 2021-07-29 – 2021-08-14 (×40): 1000 mg via ORAL
  Filled 2021-07-29 (×43): qty 2

## 2021-07-29 MED ORDER — HYDRALAZINE HCL 50 MG PO TABS
50.0000 mg | ORAL_TABLET | Freq: Three times a day (TID) | ORAL | Status: DC
Start: 2021-07-29 — End: 2021-07-29

## 2021-07-29 MED ORDER — SODIUM ZIRCONIUM CYCLOSILICATE 5 G PO PACK
5.0000 g | PACK | Freq: Once | ORAL | Status: AC
  Administered 2021-07-29: 5 g via ORAL
  Filled 2021-07-29: qty 1

## 2021-07-29 MED ORDER — HYDRALAZINE HCL 50 MG PO TABS
75.0000 mg | ORAL_TABLET | Freq: Three times a day (TID) | ORAL | Status: DC
  Administered 2021-07-29 – 2021-07-30 (×3): 75 mg via ORAL
  Filled 2021-07-29 (×3): qty 1

## 2021-07-29 MED ORDER — INSULIN ASPART 100 UNIT/ML IJ SOLN
0.0000 [IU] | Freq: Three times a day (TID) | INTRAMUSCULAR | Status: DC
  Administered 2021-07-29: 3 [IU] via SUBCUTANEOUS
  Administered 2021-07-29: 8 [IU] via SUBCUTANEOUS
  Administered 2021-07-30 (×3): 3 [IU] via SUBCUTANEOUS
  Administered 2021-07-31: 2 [IU] via SUBCUTANEOUS
  Administered 2021-07-31 – 2021-08-01 (×2): 3 [IU] via SUBCUTANEOUS
  Administered 2021-08-01: 5 [IU] via SUBCUTANEOUS
  Administered 2021-08-01: 3 [IU] via SUBCUTANEOUS
  Administered 2021-08-02: 8 [IU] via SUBCUTANEOUS
  Administered 2021-08-03: 5 [IU] via SUBCUTANEOUS
  Administered 2021-08-03 – 2021-08-05 (×3): 3 [IU] via SUBCUTANEOUS
  Administered 2021-08-05: 2 [IU] via SUBCUTANEOUS
  Administered 2021-08-06: 3 [IU] via SUBCUTANEOUS
  Administered 2021-08-06 (×2): 5 [IU] via SUBCUTANEOUS
  Administered 2021-08-07: 11 [IU] via SUBCUTANEOUS
  Administered 2021-08-07: 2 [IU] via SUBCUTANEOUS
  Administered 2021-08-07: 8 [IU] via SUBCUTANEOUS
  Administered 2021-08-08: 11 [IU] via SUBCUTANEOUS
  Administered 2021-08-08: 8 [IU] via SUBCUTANEOUS
  Administered 2021-08-09: 15 [IU] via SUBCUTANEOUS
  Administered 2021-08-09: 8 [IU] via SUBCUTANEOUS
  Administered 2021-08-09: 5 [IU] via SUBCUTANEOUS
  Administered 2021-08-10: 11 [IU] via SUBCUTANEOUS
  Administered 2021-08-10: 5 [IU] via SUBCUTANEOUS
  Administered 2021-08-10: 15 [IU] via SUBCUTANEOUS
  Administered 2021-08-11: 11 [IU] via SUBCUTANEOUS
  Administered 2021-08-11: 2 [IU] via SUBCUTANEOUS
  Administered 2021-08-12: 5 [IU] via SUBCUTANEOUS
  Administered 2021-08-12: 3 [IU] via SUBCUTANEOUS

## 2021-07-29 MED ORDER — TORSEMIDE 20 MG PO TABS
20.0000 mg | ORAL_TABLET | Freq: Every evening | ORAL | Status: DC
  Administered 2021-07-29: 20 mg via ORAL
  Filled 2021-07-29 (×3): qty 1

## 2021-07-29 MED ORDER — POTASSIUM CHLORIDE CRYS ER 20 MEQ PO TBCR: 20.0000 meq | EXTENDED_RELEASE_TABLET | Freq: Once | ORAL | Status: DC

## 2021-07-29 MED ORDER — ACETAMINOPHEN 325 MG PO TABS
650.0000 mg | ORAL_TABLET | Freq: Four times a day (QID) | ORAL | Status: DC | PRN
  Administered 2021-07-29: 650 mg via ORAL
  Filled 2021-07-29: qty 2

## 2021-07-29 MED ORDER — CARVEDILOL 25 MG PO TABS
25.0000 mg | ORAL_TABLET | Freq: Two times a day (BID) | ORAL | Status: DC
  Administered 2021-07-29 – 2021-07-31 (×5): 25 mg via ORAL
  Filled 2021-07-29 (×5): qty 1

## 2021-07-29 MED ORDER — POTASSIUM CHLORIDE CRYS ER 10 MEQ PO TBCR
10.0000 meq | EXTENDED_RELEASE_TABLET | Freq: Once | ORAL | Status: AC
  Administered 2021-07-29: 10 meq via ORAL
  Filled 2021-07-29: qty 1

## 2021-07-29 MED ORDER — ISOSORBIDE MONONITRATE ER 60 MG PO TB24
60.0000 mg | ORAL_TABLET | Freq: Every day | ORAL | Status: DC
Start: 2021-07-30 — End: 2021-08-02
  Administered 2021-07-30 – 2021-08-02 (×4): 60 mg via ORAL
  Filled 2021-07-29 (×4): qty 1
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
## 2021-07-29 NOTE — TOC Benefit Eligibility Note (Signed)
Patient Advocate Encounter ? ?Insurance verification completed.   ? ?The patient is currently admitted and upon discharge could be taking Entresto 24-26 mg. ? ?The current 30 day co-pay is, $312.00 due to a $265.00 deductible remaining.  Sally be $47.00 once deductible is met.  ? ?The patient is currently admitted and upon discharge could be taking Jardiance 10 mg. ? ?The current 30 day co-pay is, $312.00 due to a $265.00 deductible remaining.  Jahmil be $47.00 once deductible is met.  ? ?The patient is currently admitted and upon discharge could be taking Farxiga 10 mg. ? ?The current 30 day co-pay is, $364.00 due to a $265.00 deductible remaining.  Dawaun be $99.00 once deductible is met.  ? ?The patient is insured through [employer] Part D  ? ? ? ?Lyndel Safe, CPhT ?Pharmacy Patient Advocate Specialist ?[facility] Patient Advocate Team ?Direct Number: [phone]  Fax: [phone] ? ? ? ? ? ?  ?

## 2021-07-29 NOTE — Progress Notes (Addendum)
eLink Physician-Brief Progress Note ?Patient Name: [name]. ?DOB: [birth year] ?MRN: [id number] ? ? ?Date of Service ? 07/29/2021  ?HPI/Events of Note ? K+ 5.4, Cr 3.48, GFR 18.  ?eICU Interventions ? Lokelma 5 gm po x 1, BMP at 11 AM.  ? ? ? ?  ? ?Frederik Pear ?07/29/2021, 4:53 AM ?
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
## 2021-07-29 NOTE — Progress Notes (Addendum)
Inpatient Rehab Admissions Coordinator:   Per therapy recommendations,  patient was screened for CIR candidacy by Hasna Stefanik, MS, CCC-SLP . At this time, Pt. Appears to be a a potential candidate for CIR. I Petra request   order for rehab consult per protocol for full assessment. Please contact me any with questions.  Ixel Boehning, MS, CCC-SLP Rehab Admissions Coordinator  [phone] (celll) [phone] (office)  
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
## 2021-07-29 NOTE — TOC CAGE-AID Note (Signed)
Transition of Care (TOC) - CAGE-AID Screening ? ? ?Patient Details  ?Name: [name]. ?MRN: [id number] ?Date of Birth: 1950/12/24 ? ?Transition of Care (TOC) CM/SW Contact:    ?Arva Slaugh C Tarpley-Carter, LCSWA ?Phone Number: ?07/29/2021, 12:24 PM ? ? ?Clinical Narrative: ?Pt participated in [place].  Pt stated he does not use substance or ETOH.  Pt was not offered resources, due to no usage of substance or ETOH.   ? ?Passenger transport manager, MSW, LCSW-A ?Pronouns:  She/Her/Hers ?[facility] of [facility] ?Clinical Social Worker ?Direct Number:  [phone] ?[email or web address]  ? ?CAGE-AID Screening: ?  ? ?Have You Ever Felt You Ought to Cut Down on Your Drinking or Drug Use?: No ?Have People Annoyed You By Critizing Your Drinking Or Drug Use?: No ?Have You Felt Bad Or Guilty About Your Drinking Or Drug Use?: No ?Have You Ever Had a Drink or Used Drugs First Thing In The Morning to Steady Your Nerves or to Get Rid of a Hangover?: No ?CAGE-AID Score: 0 ? ?Substance Abuse Education Offered: No ? ?  ? ? ? ? ? ? ?

## 2021-07-30 DIAGNOSIS — T1490XA Injury, unspecified, initial encounter: Secondary | ICD-10-CM

## 2021-07-30 DIAGNOSIS — R9389 Abnormal findings on diagnostic imaging of other specified body structures: Secondary | ICD-10-CM | POA: Diagnosis not present

## 2021-07-30 DIAGNOSIS — Z515 Encounter for palliative care: Secondary | ICD-10-CM | POA: Diagnosis not present

## 2021-07-30 DIAGNOSIS — J9601 Acute respiratory failure with hypoxia: Secondary | ICD-10-CM | POA: Diagnosis not present

## 2021-07-30 DIAGNOSIS — N184 Chronic kidney disease, stage 4 (severe): Secondary | ICD-10-CM | POA: Diagnosis not present

## 2021-07-30 DIAGNOSIS — R7989 Other specified abnormal findings of blood chemistry: Secondary | ICD-10-CM | POA: Diagnosis not present

## 2021-07-30 DIAGNOSIS — Z7189 Other specified counseling: Secondary | ICD-10-CM | POA: Diagnosis not present

## 2021-07-30 DIAGNOSIS — I469 Cardiac arrest, cause unspecified: Secondary | ICD-10-CM | POA: Diagnosis not present

## 2021-07-30 LAB — HEMOGLOBIN A1C
Hgb A1c MFr Bld: 7.2 % — ABNORMAL HIGH (ref 4.8–5.6)
Mean Plasma Glucose: 160 mg/dL

## 2021-07-30 LAB — CBC
HCT: 28 % — ABNORMAL LOW (ref 39.0–52.0)
Hemoglobin: 8.7 g/dL — ABNORMAL LOW (ref 13.0–17.0)
MCH: 27.6 pg (ref 26.0–34.0)
MCHC: 31.1 g/dL (ref 30.0–36.0)
MCV: 88.9 fL (ref 80.0–100.0)
Platelets: 147 10*3/uL — ABNORMAL LOW (ref 150–400)
RBC: 3.15 MIL/uL — ABNORMAL LOW (ref 4.22–5.81)
RDW: 16.1 % — ABNORMAL HIGH (ref 11.5–15.5)
WBC: 11 10*3/uL — ABNORMAL HIGH (ref 4.0–10.5)
nRBC: 0 % (ref 0.0–0.2)

## 2021-07-30 LAB — COMPREHENSIVE METABOLIC PANEL
ALT: 49 U/L — ABNORMAL HIGH (ref 0–44)
AST: 49 U/L — ABNORMAL HIGH (ref 15–41)
Albumin: 2.8 g/dL — ABNORMAL LOW (ref 3.5–5.0)
Alkaline Phosphatase: 70 U/L (ref 38–126)
Anion gap: 17 — ABNORMAL HIGH (ref 5–15)
BUN: 70 mg/dL — ABNORMAL HIGH (ref 8–23)
CO2: 18 mmol/L — ABNORMAL LOW (ref 22–32)
Calcium: 8.3 mg/dL — ABNORMAL LOW (ref 8.9–10.3)
Chloride: 98 mmol/L (ref 98–111)
Creatinine, Ser: 4.29 mg/dL — ABNORMAL HIGH (ref 0.61–1.24)
GFR, Estimated: 14 mL/min — ABNORMAL LOW (ref >60)
Glucose, Bld: 197 mg/dL — ABNORMAL HIGH (ref 70–99)
Potassium: 4.3 mmol/L (ref 3.5–5.1)
Sodium: 133 mmol/L — ABNORMAL LOW (ref 135–145)
Total Bilirubin: 1 mg/dL (ref 0.3–1.2)
Total Protein: 6.9 g/dL (ref 6.5–8.1)

## 2021-07-30 LAB — GLUCOSE, CAPILLARY
Glucose-Capillary: 180 mg/dL — ABNORMAL HIGH (ref 70–99)
Glucose-Capillary: 199 mg/dL — ABNORMAL HIGH (ref 70–99)
Glucose-Capillary: 176 mg/dL — ABNORMAL HIGH (ref 70–99)

## 2021-07-30 MED ORDER — HYDRALAZINE HCL 50 MG PO TABS
100.0000 mg | ORAL_TABLET | Freq: Three times a day (TID) | ORAL | Status: DC
Start: 2021-07-30 — End: 2021-08-02
  Administered 2021-07-30 – 2021-08-02 (×9): 100 mg via ORAL
  Filled 2021-07-30 (×9): qty 2
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
## 2021-07-30 NOTE — Progress Notes (Signed)
Inpatient Rehab Admissions Coordinator:  ? ?Consult received.  Note GOC discussion today and Vic review prior to eval.  Also note Covid + on 3/13 and plans for 10 day isolation.  Patients are eligible to be considered for admit to the [facility] when cleared from airborne precautions by Acute MD, or Infectious Disease MD.  Vignesh f/u closer to removal of precautions.   ? ?Shann Medal, PT, DPT ?Admissions Coordinator ?[phone] ?07/30/21  ?4:39 PM ? ?

## 2021-07-31 ENCOUNTER — Inpatient Hospital Stay (HOSPITAL_COMMUNITY): Payer: Medicare PPO

## 2021-07-31 DIAGNOSIS — J690 Pneumonitis due to inhalation of food and vomit: Secondary | ICD-10-CM | POA: Diagnosis not present

## 2021-07-31 DIAGNOSIS — Z7189 Other specified counseling: Secondary | ICD-10-CM | POA: Diagnosis not present

## 2021-07-31 DIAGNOSIS — G931 Anoxic brain damage, not elsewhere classified: Secondary | ICD-10-CM | POA: Diagnosis not present

## 2021-07-31 DIAGNOSIS — J9600 Acute respiratory failure, unspecified whether with hypoxia or hypercapnia: Secondary | ICD-10-CM | POA: Diagnosis not present

## 2021-07-31 DIAGNOSIS — Z515 Encounter for palliative care: Secondary | ICD-10-CM | POA: Diagnosis not present

## 2021-07-31 DIAGNOSIS — J9601 Acute respiratory failure with hypoxia: Secondary | ICD-10-CM | POA: Diagnosis not present

## 2021-07-31 DIAGNOSIS — J189 Pneumonia, unspecified organism: Secondary | ICD-10-CM

## 2021-07-31 DIAGNOSIS — N179 Acute kidney failure, unspecified: Secondary | ICD-10-CM | POA: Diagnosis not present

## 2021-07-31 DIAGNOSIS — S2249XA Multiple fractures of ribs, unspecified side, initial encounter for closed fracture: Secondary | ICD-10-CM | POA: Diagnosis not present

## 2021-07-31 DIAGNOSIS — I469 Cardiac arrest, cause unspecified: Secondary | ICD-10-CM | POA: Diagnosis not present

## 2021-07-31 DIAGNOSIS — R9389 Abnormal findings on diagnostic imaging of other specified body structures: Secondary | ICD-10-CM | POA: Diagnosis not present

## 2021-07-31 DIAGNOSIS — G9341 Metabolic encephalopathy: Secondary | ICD-10-CM | POA: Diagnosis not present

## 2021-07-31 DIAGNOSIS — N184 Chronic kidney disease, stage 4 (severe): Secondary | ICD-10-CM | POA: Diagnosis not present

## 2021-07-31 DIAGNOSIS — R7989 Other specified abnormal findings of blood chemistry: Secondary | ICD-10-CM | POA: Diagnosis not present

## 2021-07-31 LAB — BASIC METABOLIC PANEL
Anion gap: 14 (ref 5–15)
BUN: 80 mg/dL — ABNORMAL HIGH (ref 8–23)
CO2: 19 mmol/L — ABNORMAL LOW (ref 22–32)
Calcium: 8.2 mg/dL — ABNORMAL LOW (ref 8.9–10.3)
Chloride: 96 mmol/L — ABNORMAL LOW (ref 98–111)
Creatinine, Ser: 5.55 mg/dL — ABNORMAL HIGH (ref 0.61–1.24)
GFR, Estimated: 10 mL/min — ABNORMAL LOW (ref >60)
Glucose, Bld: 138 mg/dL — ABNORMAL HIGH (ref 70–99)
Potassium: 4.1 mmol/L (ref 3.5–5.1)
Sodium: 129 mmol/L — ABNORMAL LOW (ref 135–145)

## 2021-07-31 LAB — URINALYSIS, COMPLETE (UACMP) WITH MICROSCOPIC
Bilirubin Urine: NEGATIVE
Glucose, UA: NEGATIVE mg/dL
Hgb urine dipstick: NEGATIVE
Ketones, ur: NEGATIVE mg/dL
Nitrite: NEGATIVE
Protein, ur: NEGATIVE mg/dL
Specific Gravity, Urine: 1.018 (ref 1.005–1.030)
pH: 5 (ref 5.0–8.0)

## 2021-07-31 LAB — GLUCOSE, CAPILLARY
Glucose-Capillary: 112 mg/dL — ABNORMAL HIGH (ref 70–99)
Glucose-Capillary: 153 mg/dL — ABNORMAL HIGH (ref 70–99)
Glucose-Capillary: 123 mg/dL — ABNORMAL HIGH (ref 70–99)
Glucose-Capillary: 92 mg/dL (ref 70–99)
Glucose-Capillary: 105 mg/dL — ABNORMAL HIGH (ref 70–99)

## 2021-07-31 LAB — CBC
HCT: 24.1 % — ABNORMAL LOW (ref 39.0–52.0)
Hemoglobin: 7.8 g/dL — ABNORMAL LOW (ref 13.0–17.0)
MCH: 27.5 pg (ref 26.0–34.0)
MCHC: 32.4 g/dL (ref 30.0–36.0)
MCV: 84.9 fL (ref 80.0–100.0)
Platelets: 132 10*3/uL — ABNORMAL LOW (ref 150–400)
RBC: 2.84 MIL/uL — ABNORMAL LOW (ref 4.22–5.81)
RDW: 15.9 % — ABNORMAL HIGH (ref 11.5–15.5)
WBC: 20.6 10*3/uL — ABNORMAL HIGH (ref 4.0–10.5)
nRBC: 0 % (ref 0.0–0.2)

## 2021-07-31 LAB — PROCALCITONIN: Procalcitonin: 2.01 ng/mL

## 2021-07-31 LAB — BLOOD GAS, ARTERIAL
Acid-base deficit: 0.7 mmol/L (ref 0.0–2.0)
Bicarbonate: 23.4 mmol/L (ref 20.0–28.0)
Drawn by: 54887
O2 Saturation: 95.1 %
Patient temperature: 37
pCO2 arterial: 36 mmHg (ref 32–48)
pH, Arterial: 7.42 (ref 7.35–7.45)
pO2, Arterial: 70 mmHg — ABNORMAL LOW (ref 83–108)

## 2021-07-31 LAB — MRSA NEXT GEN BY PCR, NASAL: MRSA by PCR Next Gen: NOT DETECTED

## 2021-07-31 LAB — MAGNESIUM: Magnesium: 2 mg/dL (ref 1.7–2.4)

## 2021-07-31 LAB — TROPONIN I (HIGH SENSITIVITY): Troponin I (High Sensitivity): 70 ng/L — ABNORMAL HIGH (ref <18)

## 2021-07-31 MED ORDER — LINEZOLID 600 MG/300ML IV SOLN
600.0000 mg | Freq: Two times a day (BID) | INTRAVENOUS | Status: DC
  Administered 2021-07-31: 600 mg via INTRAVENOUS
  Filled 2021-07-31: qty 300

## 2021-07-31 MED ORDER — LIDOCAINE 5 % EX PTCH
3.0000 | MEDICATED_PATCH | CUTANEOUS | Status: DC
  Administered 2021-08-01 – 2021-08-14 (×13): 3 via TRANSDERMAL
  Filled 2021-07-31 (×14): qty 3

## 2021-07-31 MED ORDER — PIPERACILLIN-TAZOBACTAM IN DEX 2-0.25 GM/50ML IV SOLN
2.2500 g | Freq: Three times a day (TID) | INTRAVENOUS | Status: DC
  Filled 2021-07-31 (×2): qty 50

## 2021-07-31 MED ORDER — OXYCODONE HCL 5 MG PO TABS
5.0000 mg | ORAL_TABLET | Freq: Four times a day (QID) | ORAL | Status: DC | PRN
  Administered 2021-07-31: 5 mg via ORAL
  Filled 2021-07-31: qty 1

## 2021-07-31 MED ORDER — FUROSEMIDE 10 MG/ML IJ SOLN
120.0000 mg | Freq: Once | INTRAVENOUS | Status: AC
  Administered 2021-07-31: 120 mg via INTRAVENOUS
  Filled 2021-07-31: qty 10

## 2021-07-31 MED ORDER — SODIUM CHLORIDE 0.9 % IV SOLN
2.0000 g | Freq: Once | INTRAVENOUS | Status: AC
  Administered 2021-07-31: 2 g via INTRAVENOUS
  Filled 2021-07-31: qty 2

## 2021-07-31 MED ORDER — MUSCLE RUB 10-15 % EX CREA
TOPICAL_CREAM | CUTANEOUS | Status: DC | PRN
  Filled 2021-07-31: qty 85

## 2021-07-31 MED ORDER — METOLAZONE 5 MG PO TABS
5.0000 mg | ORAL_TABLET | Freq: Once | ORAL | Status: AC
  Administered 2021-07-31: 5 mg via ORAL
  Filled 2021-07-31: qty 1

## 2021-07-31 MED ORDER — SODIUM CHLORIDE 0.9 % IV SOLN: 2.0000 g | INTRAVENOUS | Status: DC

## 2021-07-31 MED ORDER — HYDROMORPHONE HCL 2 MG PO TABS
1.0000 mg | ORAL_TABLET | ORAL | Status: DC | PRN
  Administered 2021-07-31 – 2021-08-01 (×4): 2 mg via ORAL
  Filled 2021-07-31 (×4): qty 1

## 2021-07-31 MED ORDER — GUAIFENESIN ER 600 MG PO TB12
600.0000 mg | ORAL_TABLET | Freq: Two times a day (BID) | ORAL | Status: DC
Start: 2021-07-31 — End: 2021-08-14
  Administered 2021-07-31 – 2021-08-14 (×25): 600 mg via ORAL
  Filled 2021-07-31 (×28): qty 1
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
## 2021-07-31 NOTE — Progress Notes (Signed)
Overnight event ? ?Notified by RN that patient is in respiratory distress.  Reportedly suddenly became tachypneic to the 40s and desatted to mid 70s on room air.  He was initially placed on 15 L/NRB and sats improved to 100%.  Now weaned down to 6 L nasal cannula and maintaining sats in the mid 90s. ? ?Patient seen and examined at bedside.  Awake and alert, resting comfortably watching television.  Satting in the mid 90s on 5 L supplemental oxygen.  Not tachycardic.  Slightly tachypneic with respiratory rate in the mid 20s.  Able to speak clearly in full sentences.  Denies shortness of breath.  Reporting chest soreness and pointing to his substernal region.  Equal air entry upon auscultation of the lungs bilaterally, no wheezing or rales appreciated. ? ?-Stat EKG done and showing T wave abnormality in lateral leads, seen on prior tracing from 3/14 as well. ?-High-sensitivity troponin 70, improved compared to 3 days ago ?-Stat chest x-ray ordered to rule out pneumothorax given known rib fractures and sternum fracture. ? ? ? ? ? ?
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
## 2021-07-31 NOTE — Care Management Important Message (Signed)
Important Message ? ?Patient Details  ?Name: [name]. ?MRN: [id number] ?Date of Birth: [birth year] ? ? ?Medicare Important Message Given:  Yes ? ? ? ? ?[name] ?07/31/2021, 9:32 AM ?
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
## 2021-07-31 NOTE — Progress Notes (Addendum)
Inpatient Rehab Admissions Coordinator:  ? ?GOC clear at this time.  Kallin f/u with pt/family next week to discuss CIR recommendations if he remains appropriate.  ? ?Shann Medal, PT, DPT ?Admissions Coordinator ?[phone] ?07/31/21  ?1:04 PM ? ?

## 2021-07-31 NOTE — Progress Notes (Signed)
ABG sample was collected and sent to Lab. Lab was called and notified.  

## 2021-07-31 NOTE — Progress Notes (Signed)
Patient's oxygen level has gone down to 70 %. Patient was working hard breathing and stated that his chest is in pain. Patient rated the pain 10/10. Nurse gave patient ordered prn oxycodone and put him on oxygen 3L via nasal cannula. Patient's oxygen was still at the 70's. Increased oxygen to 5 L and patient's oxygen was still at 79-80's. Nurse called charge nurse to bedside. MD was paged, rapid response and respiratory was called. Charge nurse came in with a non rebreather and started patient on it. Respiratory shortly came and placed patient on 15 L nasal cannula and 15 L non rebreather. Patient oxygen saturation has gone up to 90%. Nurse took full set of vital signs. Respiratory rate is 40. Weaned patient to the nasal cannula 5 L. Patient has been in the 90's and resting. ?
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
## 2021-08-01 DIAGNOSIS — B9561 Methicillin susceptible Staphylococcus aureus infection as the cause of diseases classified elsewhere: Secondary | ICD-10-CM

## 2021-08-01 DIAGNOSIS — I469 Cardiac arrest, cause unspecified: Secondary | ICD-10-CM | POA: Diagnosis not present

## 2021-08-01 DIAGNOSIS — R9389 Abnormal findings on diagnostic imaging of other specified body structures: Secondary | ICD-10-CM | POA: Diagnosis not present

## 2021-08-01 DIAGNOSIS — R7989 Other specified abnormal findings of blood chemistry: Secondary | ICD-10-CM | POA: Diagnosis not present

## 2021-08-01 DIAGNOSIS — R7881 Bacteremia: Secondary | ICD-10-CM

## 2021-08-01 DIAGNOSIS — J9600 Acute respiratory failure, unspecified whether with hypoxia or hypercapnia: Secondary | ICD-10-CM | POA: Diagnosis not present

## 2021-08-01 DIAGNOSIS — I5020 Unspecified systolic (congestive) heart failure: Secondary | ICD-10-CM | POA: Diagnosis not present

## 2021-08-01 DIAGNOSIS — N179 Acute kidney failure, unspecified: Secondary | ICD-10-CM | POA: Diagnosis not present

## 2021-08-01 DIAGNOSIS — G9340 Encephalopathy, unspecified: Secondary | ICD-10-CM | POA: Diagnosis not present

## 2021-08-01 DIAGNOSIS — J9601 Acute respiratory failure with hypoxia: Secondary | ICD-10-CM | POA: Diagnosis not present

## 2021-08-01 DIAGNOSIS — N184 Chronic kidney disease, stage 4 (severe): Secondary | ICD-10-CM | POA: Diagnosis not present

## 2021-08-01 LAB — BLOOD CULTURE ID PANEL (REFLEXED) - BCID2

## 2021-08-01 LAB — EXPECTORATED SPUTUM ASSESSMENT W GRAM STAIN, RFLX TO RESP C

## 2021-08-01 LAB — HEPATIC FUNCTION PANEL
ALT: 34 U/L (ref 0–44)
AST: 26 U/L (ref 15–41)
Albumin: 2.4 g/dL — ABNORMAL LOW (ref 3.5–5.0)
Alkaline Phosphatase: 76 U/L (ref 38–126)
Bilirubin, Direct: 0.3 mg/dL — ABNORMAL HIGH (ref 0.0–0.2)
Indirect Bilirubin: 0.6 mg/dL (ref 0.3–0.9)
Total Bilirubin: 0.9 mg/dL (ref 0.3–1.2)
Total Protein: 6.3 g/dL — ABNORMAL LOW (ref 6.5–8.1)

## 2021-08-01 LAB — CBC
HCT: 24.5 % — ABNORMAL LOW (ref 39.0–52.0)
Hemoglobin: 8.1 g/dL — ABNORMAL LOW (ref 13.0–17.0)
MCH: 27.8 pg (ref 26.0–34.0)
MCHC: 33.1 g/dL (ref 30.0–36.0)
MCV: 84.2 fL (ref 80.0–100.0)
Platelets: 114 10*3/uL — ABNORMAL LOW (ref 150–400)
RBC: 2.91 MIL/uL — ABNORMAL LOW (ref 4.22–5.81)
RDW: 15.7 % — ABNORMAL HIGH (ref 11.5–15.5)
WBC: 21.9 10*3/uL — ABNORMAL HIGH (ref 4.0–10.5)
nRBC: 0 % (ref 0.0–0.2)

## 2021-08-01 LAB — GLUCOSE, CAPILLARY
Glucose-Capillary: 161 mg/dL — ABNORMAL HIGH (ref 70–99)
Glucose-Capillary: 180 mg/dL — ABNORMAL HIGH (ref 70–99)
Glucose-Capillary: 229 mg/dL — ABNORMAL HIGH (ref 70–99)

## 2021-08-01 LAB — BASIC METABOLIC PANEL
Anion gap: 16 — ABNORMAL HIGH (ref 5–15)
BUN: 95 mg/dL — ABNORMAL HIGH (ref 8–23)
CO2: 19 mmol/L — ABNORMAL LOW (ref 22–32)
Calcium: 8.1 mg/dL — ABNORMAL LOW (ref 8.9–10.3)
Chloride: 95 mmol/L — ABNORMAL LOW (ref 98–111)
Creatinine, Ser: 6.79 mg/dL — ABNORMAL HIGH (ref 0.61–1.24)
GFR, Estimated: 8 mL/min — ABNORMAL LOW (ref >60)
Glucose, Bld: 139 mg/dL — ABNORMAL HIGH (ref 70–99)
Potassium: 4.5 mmol/L (ref 3.5–5.1)
Sodium: 130 mmol/L — ABNORMAL LOW (ref 135–145)

## 2021-08-01 LAB — MAGNESIUM: Magnesium: 2 mg/dL (ref 1.7–2.4)

## 2021-08-01 MED ORDER — HYDROMORPHONE HCL 2 MG PO TABS
1.0000 mg | ORAL_TABLET | ORAL | Status: DC | PRN
  Administered 2021-08-02: 1 mg via ORAL
  Filled 2021-08-01: qty 1

## 2021-08-01 MED ORDER — CEFAZOLIN SODIUM-DEXTROSE 1-4 GM/50ML-% IV SOLN
1.0000 g | Freq: Two times a day (BID) | INTRAVENOUS | Status: DC
Start: 2021-08-01 — End: 2021-08-04
  Administered 2021-08-01 – 2021-08-03 (×6): 1 g via INTRAVENOUS
  Filled 2021-08-01 (×8): qty 50
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
## 2021-08-01 NOTE — Progress Notes (Signed)
O2 Sats dropped to 70's with good pleth on monitor after coughing while eating supper. Increased 02 to 4 L [place]. Sats improved to mid to 80's on 4 L. IS given to pt with verbal instructions. Demonstrates good ability to use IS with encouragement. Dr Bonner Puna notified. Order received to keep NPO until seen by SLP tomorrow. Informed pt. Verbalized understanding.  ?
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
## 2021-08-01 NOTE — Progress Notes (Signed)
18 bts VT on Cardiac Monitor. Asymptomatic. Oxygen sats noted to be 70 - 80 's  on RA after eating lunch. Pt did have some coughing with eating lunch. Assisted with repositioning up with HOB 45 degrees. O2 placed at 2 L via [place]. O2 sats = 85 - 89% on 2 L. No wheezing noted per auscultation. Denies SOB. Dr Bonner Puna updated.  ?
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
## 2021-08-02 DIAGNOSIS — I509 Heart failure, unspecified: Secondary | ICD-10-CM

## 2021-08-02 DIAGNOSIS — I469 Cardiac arrest, cause unspecified: Secondary | ICD-10-CM | POA: Diagnosis not present

## 2021-08-02 DIAGNOSIS — R7989 Other specified abnormal findings of blood chemistry: Secondary | ICD-10-CM | POA: Diagnosis not present

## 2021-08-02 DIAGNOSIS — R9389 Abnormal findings on diagnostic imaging of other specified body structures: Secondary | ICD-10-CM | POA: Diagnosis not present

## 2021-08-02 DIAGNOSIS — J9601 Acute respiratory failure with hypoxia: Secondary | ICD-10-CM | POA: Diagnosis not present

## 2021-08-02 DIAGNOSIS — B9561 Methicillin susceptible Staphylococcus aureus infection as the cause of diseases classified elsewhere: Secondary | ICD-10-CM | POA: Diagnosis not present

## 2021-08-02 DIAGNOSIS — R7881 Bacteremia: Secondary | ICD-10-CM | POA: Diagnosis not present

## 2021-08-02 LAB — CBC
HCT: 23.3 % — ABNORMAL LOW (ref 39.0–52.0)
Hemoglobin: 8.2 g/dL — ABNORMAL LOW (ref 13.0–17.0)
MCH: 28.5 pg (ref 26.0–34.0)
MCHC: 35.2 g/dL (ref 30.0–36.0)
MCV: 80.9 fL (ref 80.0–100.0)
Platelets: 131 10*3/uL — ABNORMAL LOW (ref 150–400)
RBC: 2.88 MIL/uL — ABNORMAL LOW (ref 4.22–5.81)
RDW: 15.4 % (ref 11.5–15.5)
WBC: 17.9 10*3/uL — ABNORMAL HIGH (ref 4.0–10.5)
nRBC: 0.1 % (ref 0.0–0.2)

## 2021-08-02 LAB — GLUCOSE, CAPILLARY
Glucose-Capillary: 136 mg/dL — ABNORMAL HIGH (ref 70–99)
Glucose-Capillary: 146 mg/dL — ABNORMAL HIGH (ref 70–99)
Glucose-Capillary: 232 mg/dL — ABNORMAL HIGH (ref 70–99)
Glucose-Capillary: 257 mg/dL — ABNORMAL HIGH (ref 70–99)
Glucose-Capillary: 208 mg/dL — ABNORMAL HIGH (ref 70–99)
Glucose-Capillary: 116 mg/dL — ABNORMAL HIGH (ref 70–99)

## 2021-08-02 LAB — CULTURE, BLOOD (ROUTINE X 2)
Special Requests: ADEQUATE
Special Requests: ADEQUATE

## 2021-08-02 LAB — BASIC METABOLIC PANEL
Anion gap: 18 — ABNORMAL HIGH (ref 5–15)
BUN: 109 mg/dL — ABNORMAL HIGH (ref 8–23)
CO2: 16 mmol/L — ABNORMAL LOW (ref 22–32)
Calcium: 7.7 mg/dL — ABNORMAL LOW (ref 8.9–10.3)
Chloride: 89 mmol/L — ABNORMAL LOW (ref 98–111)
Creatinine, Ser: 7.86 mg/dL — ABNORMAL HIGH (ref 0.61–1.24)
GFR, Estimated: 7 mL/min — ABNORMAL LOW (ref >60)
Glucose, Bld: 144 mg/dL — ABNORMAL HIGH (ref 70–99)
Potassium: 5 mmol/L (ref 3.5–5.1)
Sodium: 123 mmol/L — ABNORMAL LOW (ref 135–145)

## 2021-08-02 LAB — MAGNESIUM: Magnesium: 1.9 mg/dL (ref 1.7–2.4)

## 2021-08-02 MED ORDER — AMIODARONE HCL 200 MG PO TABS
200.0000 mg | ORAL_TABLET | Freq: Two times a day (BID) | ORAL | Status: DC
  Administered 2021-08-02 – 2021-08-14 (×25): 200 mg via ORAL
  Filled 2021-08-02 (×26): qty 1

## 2021-08-02 MED ORDER — SODIUM BICARBONATE 650 MG PO TABS
1300.0000 mg | ORAL_TABLET | Freq: Two times a day (BID) | ORAL | Status: DC
  Administered 2021-08-02 – 2021-08-06 (×8): 1300 mg via ORAL
  Filled 2021-08-02 (×8): qty 2

## 2021-08-02 MED ORDER — CHLORHEXIDINE GLUCONATE CLOTH 2 % EX PADS
6.0000 | MEDICATED_PAD | Freq: Every day | CUTANEOUS | Status: DC
  Administered 2021-08-02 – 2021-08-10 (×9): 6 via TOPICAL

## 2021-08-02 MED ORDER — FUROSEMIDE 10 MG/ML IJ SOLN
120.0000 mg | Freq: Four times a day (QID) | INTRAVENOUS | Status: AC
  Administered 2021-08-02 (×2): 120 mg via INTRAVENOUS
  Filled 2021-08-02 (×2): qty 12
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
## 2021-08-02 NOTE — Progress Notes (Addendum)
Attempted to give PO meds whole with Apple Sauce. Pt unable to swallow. Oral suction used to clear mouth. Tolerated fair with strong cough.  ?
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
## 2021-08-02 NOTE — Progress Notes (Addendum)
Chaplain responded to [facility].  Due to pt being Covid +, RN checked with pt before chaplain entered.  Pt stated he did not want a visit at this time, as he was tired.  Per chaplain, RN advised pt that he could call back for support at any time.   ? ?Please contact when support is needed.   ? ?Minus Liberty, Chaplain ?Pager:  [phone] ? ? ? 08/02/21 1851  ?Clinical Encounter Type  ?Visited With Patient not available  ?Visit Type Initial  ?Referral From Nurse  ?Consult/Referral To Chaplain  ?Stress Factors  ?Patient Stress Factors Not reviewed  ? ? ?

## 2021-08-02 NOTE — Progress Notes (Signed)
No urine output noted this shift. Had to increase oxygen back to 5L but working to wean again. NPO for SLP eval today (coughing noted with swallowing food on dayshift). Able to take sips for meds. A&O 1-3 this shift with continued memory impairment no improved, more alert and aware at beginning but increased confusion as night has gone on. Was fixated on phone and callbell tonight, wanting to make sure both were off but would continue to fidget and press buttons. Very much wanted to speak to family and a friend (reverend). Staff assisted patient in making phone calls. Unsure if he has slept much; states he hasnt and has called out often. Patient continues to be emotional and fearful around what's going on - tearful "I dont know what's happening." Also stating he hears music/songs in his room but reassuring patient no music is playing but there are some sounds from equipment (A/C, oxygen tubing, etc). Minor complaints of pain with activity.  ?

## 2021-08-03 ENCOUNTER — Inpatient Hospital Stay (HOSPITAL_COMMUNITY): Payer: Medicare PPO

## 2021-08-03 DIAGNOSIS — R9389 Abnormal findings on diagnostic imaging of other specified body structures: Secondary | ICD-10-CM | POA: Diagnosis not present

## 2021-08-03 DIAGNOSIS — R7989 Other specified abnormal findings of blood chemistry: Secondary | ICD-10-CM | POA: Diagnosis not present

## 2021-08-03 DIAGNOSIS — B9561 Methicillin susceptible Staphylococcus aureus infection as the cause of diseases classified elsewhere: Secondary | ICD-10-CM | POA: Diagnosis not present

## 2021-08-03 DIAGNOSIS — R7881 Bacteremia: Secondary | ICD-10-CM | POA: Diagnosis not present

## 2021-08-03 DIAGNOSIS — J9601 Acute respiratory failure with hypoxia: Secondary | ICD-10-CM | POA: Diagnosis not present

## 2021-08-03 DIAGNOSIS — I469 Cardiac arrest, cause unspecified: Secondary | ICD-10-CM | POA: Diagnosis not present

## 2021-08-03 LAB — BASIC METABOLIC PANEL
Anion gap: 17 — ABNORMAL HIGH (ref 5–15)
BUN: 133 mg/dL — ABNORMAL HIGH (ref 8–23)
CO2: 17 mmol/L — ABNORMAL LOW (ref 22–32)
Calcium: 7.4 mg/dL — ABNORMAL LOW (ref 8.9–10.3)
Chloride: 88 mmol/L — ABNORMAL LOW (ref 98–111)
Creatinine, Ser: 8.17 mg/dL — ABNORMAL HIGH (ref 0.61–1.24)
GFR, Estimated: 6 mL/min — ABNORMAL LOW (ref >60)
Glucose, Bld: 109 mg/dL — ABNORMAL HIGH (ref 70–99)
Potassium: 5 mmol/L (ref 3.5–5.1)
Sodium: 122 mmol/L — ABNORMAL LOW (ref 135–145)

## 2021-08-03 LAB — GLUCOSE, CAPILLARY
Glucose-Capillary: 138 mg/dL — ABNORMAL HIGH (ref 70–99)
Glucose-Capillary: 156 mg/dL — ABNORMAL HIGH (ref 70–99)
Glucose-Capillary: 201 mg/dL — ABNORMAL HIGH (ref 70–99)
Glucose-Capillary: 244 mg/dL — ABNORMAL HIGH (ref 70–99)

## 2021-08-03 LAB — CBC
HCT: 24.3 % — ABNORMAL LOW (ref 39.0–52.0)
Hemoglobin: 8.3 g/dL — ABNORMAL LOW (ref 13.0–17.0)
MCH: 27.2 pg (ref 26.0–34.0)
MCHC: 34.2 g/dL (ref 30.0–36.0)
MCV: 79.7 fL — ABNORMAL LOW (ref 80.0–100.0)
Platelets: 179 10*3/uL (ref 150–400)
RBC: 3.05 MIL/uL — ABNORMAL LOW (ref 4.22–5.81)
RDW: 15.1 % (ref 11.5–15.5)
WBC: 16.4 10*3/uL — ABNORMAL HIGH (ref 4.0–10.5)
nRBC: 0.3 % — ABNORMAL HIGH (ref 0.0–0.2)

## 2021-08-03 LAB — ECHOCARDIOGRAM LIMITED
Height: 69 in
S' Lateral: 4.6 cm
Weight: 3139.35 oz

## 2021-08-03 LAB — HEPATITIS B SURFACE ANTIGEN: Hepatitis B Surface Ag: NONREACTIVE

## 2021-08-03 LAB — MAGNESIUM: Magnesium: 2.1 mg/dL (ref 1.7–2.4)

## 2021-08-03 LAB — HEPATITIS B SURFACE ANTIBODY,QUALITATIVE: Hep B S Ab: NONREACTIVE

## 2021-08-03 MED ORDER — FOOD THICKENER (SIMPLYTHICK)
10.0000 | ORAL | Status: DC | PRN
  Administered 2021-08-12: 2 via ORAL

## 2021-08-03 MED ORDER — LIDOCAINE HCL (PF) 1 % IJ SOLN: 5.0000 mL | INTRAMUSCULAR | Status: DC | PRN

## 2021-08-03 MED ORDER — PENTAFLUOROPROP-TETRAFLUOROETH EX AERO: 1.0000 "application " | INHALATION_SPRAY | CUTANEOUS | Status: DC | PRN

## 2021-08-03 MED ORDER — ALTEPLASE 2 MG IJ SOLR: 2.0000 mg | Freq: Once | INTRAMUSCULAR | Status: DC | PRN

## 2021-08-03 MED ORDER — LIDOCAINE-PRILOCAINE 2.5-2.5 % EX CREA: 1.0000 "application " | TOPICAL_CREAM | CUTANEOUS | Status: DC | PRN

## 2021-08-03 MED ORDER — ORAL CARE MOUTH RINSE
15.0000 mL | Freq: Two times a day (BID) | OROMUCOSAL | Status: DC
  Administered 2021-08-03 – 2021-08-14 (×20): 15 mL via OROMUCOSAL

## 2021-08-03 MED ORDER — SODIUM CHLORIDE 0.9 % IV SOLN: 100.0000 mL | INTRAVENOUS | Status: DC | PRN

## 2021-08-03 MED ORDER — HEPARIN SODIUM (PORCINE) 1000 UNIT/ML DIALYSIS
1000.0000 [IU] | INTRAMUSCULAR | Status: DC | PRN
  Filled 2021-08-03 (×2): qty 1
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
## 2021-08-03 NOTE — Progress Notes (Addendum)
?[facility] ?Progress Note  ? ? ?Assessment/ Plan:   ? Raghav Verrilli. is a/an 71 y.o. male with a past medical history HFrEF, NICM, DM2, CKD (BL 2.7-3), HTN, and Afib who present after cardiac arrest ?  ?Oliguric AKI: Likely secondary to ATN w/ cardiac arrest. Confusion largely driven by anoxic brain injury but some uremia is possible ?-Consult IR for temp cath (given +Bcx)-pending--w/ plans to do HD#1 once catheter is in place ?-Continue to monitor daily Cr, Dose meds for GFR ?-Monitor Daily I/Os, Daily weight  ?-Maintain MAP>65 for optimal renal perfusion.  ?-Avoid nephrotoxic medications including NSAIDs ?-Use synthetic opioids (Fentanyl/Dilaudid) if needed ?-Renal US w/o obstruction ?  ?Hyponatremia: Sodium 122.  Limit free water by mouth.  Plans for dialysis as above, 137Na bath/UF as tolerated ?  ?Anion gap Metabolic acidosis: 2/2 AKI. Na bicarb 1300mg  BID-can stop this once stable on HD ?  ?Cardiac arrest: LifeVest planned. Primary managing ?  ?Anoxic Brain injury: supportive care. GOC w/ family ?  ?NSVT: cardiology following. Amiodarone ?  ?CHF: likely some volume overload. Lasix 120mg  x2 doses today ?  ?Acute hypoxic respiratory failure: Likely multifactorial with COVID and some volume overload contributing.  Lasix and dialysis as above ?  ?MSSA bacteremia: Antibiotics per primary team.  Temporary catheter ?  ?COVID-19: Continue management per primary team ?  ?Anemia: Management per primary ?  ?Uncontrolled type 2 diabetes with hyperglycemia: Management per primary ?  ?  ? ?Subjective:   ?No acute events, pending temp line placement with IR  ? ?Objective:   ?BP 128/77 (BP Location: Left Arm)   Pulse 68   Temp 98 ?F (36.7 ?C) (Oral)   Resp 20   Ht 5\' 9"  (1.753 m)   Wt 89 kg   SpO2 97%   BMI 28.98 kg/m?  ? ?Intake/Output Summary (Last 24 hours) at 08/03/2021 1117 ?Last data filed at 08/03/2021 1100 ?Gross per 24 hour  ?Intake 326.6 ml  ?Output 1900 ml  ?Net -1573.4 ml  ? ?Weight  change: 2.2 kg ? ?Physical Exam: ?LOV:FIEPPIRJJOA ill appearing, sitting in chair ?CVS:rrr ?Resp:normal wob, bibasilar crackles ?CZY:SAYT, nt/nd ?KZS:WFUXN pitting edema b/l LE's ?Neuro: awake, speech clear and coherent, moves all ext spontaneously ? ?Imaging: ?ECHOCARDIOGRAM LIMITED ? ?Result Date: 08/03/2021 ?   ECHOCARDIOGRAM LIMITED REPORT   Patient Name:   [name]. Date of Exam: 08/03/2021 Medical Rec #:  [id number]         Height:       69.0 in Accession #:    [id number]        Weight:       196.2 lb Date of Birth:  18-Sep-1950        BSA:          2.049 m? Patient Age:    27 years          BP:           138/77 mmHg Patient Gender: M                 HR:           66 bpm. Exam Location:  Inpatient Procedure: Limited Echo Indications:    Limited study for bacteremia  History:        Patient has prior history of Echocardiogram examinations, most                 recent 07/28/2021. Arrythmias:Cardiac Arrest.  Sonographer:    Arlyss Gandy Referring Phys:  [id number] TRUNG T VU IMPRESSIONS  1. Ventricual function similar to previous TTE 07/28/21.  2. Left ventricular ejection fraction, by estimation, is 30 to 35%. The left ventricle has moderately decreased function. The left ventricle demonstrates global hypokinesis. The left ventricular internal cavity size was moderately dilated.  3. Right ventricular systolic function is moderately reduced. The right ventricular size is moderately enlarged.  4. Left atrial size was moderately dilated.  5. Right atrial size was moderately dilated.  6. The aortic valve is tricuspid. FINDINGS  Left Ventricle: Left ventricular ejection fraction, by estimation, is 30 to 35%. The left ventricle has moderately decreased function. The left ventricle demonstrates global hypokinesis. The left ventricular internal cavity size was moderately dilated. Right Ventricle: The right ventricular size is moderately enlarged. Right vetricular wall thickness was not assessed. Right ventricular  systolic function is moderately reduced. Left Atrium: Left atrial size was moderately dilated. Right Atrium: Right atrial size was moderately dilated. Aortic Valve: The aortic valve is tricuspid. Additional Comments: Ventricual function similar to previous TTE 07/28/21. LEFT VENTRICLE PLAX 2D LVIDd:         5.20 cm LVIDs:         4.60 cm LV PW:         1.20 cm LV IVS:        1.30 cm  Jenkins Rouge MD Electronically signed by Jenkins Rouge MD Signature Date/Time: 08/03/2021/9:41:09 AM    Final    ? ?Labs: ?BMET ?Recent Labs  ?Lab 07/28/21 ?0510 07/29/21 ?4540 07/29/21 ?1333 07/30/21 ?9811 07/31/21 ?0241 08/01/21 ?0358 08/02/21 ?9147 08/03/21 ?0155  ?NA 138 138 134* 133* 129* 130* 123* 122*  ?K 4.2 5.4* 3.8 4.3 4.1 4.5 5.0 5.0  ?CL 104 104 101 98 96* 95* 89* 88*  ?CO2 21* 21* 20* 18* 19* 19* 16* 17*  ?GLUCOSE 174* 183* 251* 197* 138* 139* 144* 109*  ?BUN 53* 55* 62* 70* 80* 95* 109* 133*  ?CREATININE 3.40* 3.48* 3.75* 4.29* 5.55* 6.79* 7.86* 8.17*  ?CALCIUM 8.3* 8.5* 8.5* 8.3* 8.2* 8.1* 7.7* 7.4*  ?PHOS 3.6  --   --   --   --   --   --   --   ? ?CBC ?Recent Labs  ?Lab 07/29/21 ?8295 07/30/21 ?6213 07/31/21 ?0241 08/01/21 ?0358 08/02/21 ?0865 08/03/21 ?0155  ?WBC 14.2*   < > 20.6* 21.9* 17.9* 16.4*  ?NEUTROABS 12.0*  --   --   --   --   --   ?HGB 9.3*   < > 7.8* 8.1* 8.2* 8.3*  ?HCT 28.9*   < > 24.1* 24.5* 23.3* 24.3*  ?MCV 87.3   < > 84.9 84.2 80.9 79.7*  ?PLT 164   < > 132* 114* 131* 179  ? < > = values in this interval not displayed.  ? ? ?Medications:   ? ? acetaminophen  1,000 mg Oral TID  ? amiodarone  200 mg Oral BID  ? Chlorhexidine Gluconate Cloth  6 each Topical Q0600  ? guaiFENesin  600 mg Oral BID  ? insulin aspart  0-15 Units Subcutaneous TID WC  ? lidocaine  3 patch Transdermal Q24H  ? mouth rinse  15 mL Mouth Rinse BID  ? sodium bicarbonate  1,300 mg Oral BID  ? ? ? ? ?Gean Quint, MD ?[facility] ?08/03/2021, 11:17 AM  ? ?

## 2021-08-03 NOTE — Progress Notes (Signed)
Echocardiogram ?2D Echocardiogram has been performed. ? ?Arlyss Gandy ?08/03/2021, 9:28 AM ?

## 2021-08-03 NOTE — Progress Notes (Addendum)
Modified Barium Swallow Progress Note ? ?Patient Details  ?Name: [name]. ?MRN: [id number] ?Date of Birth: 1950-12-30 ? ?Today's Date: 08/03/2021 ? ?Modified Barium Swallow completed.  Full report located under Chart Review in the Imaging Section. ? ?Brief recommendations include the following: ? ?Clinical Impression ? Pt presents with a mild oropharyngeal dysphagia marked by: 1) reduced oral manipulation of solids; 2) reduced pharyngeal squeeze and base-of-tongue retraction, leading to retention of solids and liquids in the pyriform sinuses and valleculae; 3) decreased arytenoid to base of epiglottis closure, leaving room for thin liquids to penetrate into larynx. There appeared to be generalized weakness with accumulation of material in the pharynx and eventual trace spillage of liquids into the larynx and eventually into the trachea.  During today's study, pt did NOT exhibit a cough in response to trace aspiration.  When he was cued to cough, he effectively expelled aspirate from trachea.  Nectar-thick liquids did not appear to penetrate as readily, however the difficulty was that over time, residuals in hypopharynx, when thinner, were entering the larynx and reaching the vocal folds.  The quantity of this material was miniscule.  Recommend resuming dysphagia 1/nectars with caution - given cognitive deficits, pt [name] need 1:1 assist during meals,with cues to follow precautions.  ?  ?Swallow Evaluation Recommendations ? ?   ? ? SLP Diet Recommendations: Dysphagia 1 (Puree) solids;Nectar thick liquid ? ? Liquid Administration via: Cup ? ? Medication Administration: Crushed with puree ? ? Supervision: Full supervision/cueing for compensatory strategies ? ? Compensations: Slow rate;Small sips/bites;Multiple dry swallows after each bite/sip ? ?   ? ? Oral Care Recommendations: Oral care BID ? ? Other Recommendations: Order thickener from pharmacy ? ? ?[name] L. Aybree Lanyon, MA CCC/SLP ?Acute Rehabilitation  Services ?Office number [phone] ?Pager [phone] ? ?[name] ?08/03/2021,3:28 PM ?
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
## 2021-08-03 NOTE — Progress Notes (Addendum)
This chaplain responded to PMT consult for prayer and spiritual support.  The Pt. is awake and joyfully greeted the chaplain.  The Pt. preferred name is [name]. ? ?The chaplain listened reflectively to the Pt. tell his story through the balloons and gifts in the Pt. room. The chaplain understands with God's love and Reverend Howard's companionship the Pt. is willing to proceed with HD today. The Pt. is hopeful God Kobe reveal the Pt[place] and time with family and friends. ? ?The Pt. accepted the chaplain's invitation for prayer and F/U spiritual care. ? ?Chaplain Sallyanne Kuster ?[phone] ?

## 2021-08-03 NOTE — Progress Notes (Signed)
Speech Pathology: ? ?Pt continues to have difficulty swallowing and was made NPO yesterday.  He is scheduled for an MBS today at 2:00. ? ?[name] L. [name], MA CCC/SLP ?Acute Rehabilitation Services ?Office number [phone] ?Pager [phone] ? ?
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
## 2021-08-03 NOTE — Care Management Important Message (Signed)
Important Message ? ?Patient Details  ?Name: [name]. ?MRN: [id number] ?Date of Birth: [birth year] ? ? ?Medicare Important Message Given:  Yes ? ? ? ? ?[name] ?08/03/2021, 10:53 AM ?

## 2021-08-03 NOTE — Progress Notes (Signed)
Inpatient Rehab Admissions Coordinator:  ? ?Continue to follow.  Note plans for initiation of HD.   ? ?Shann Medal, PT, DPT ?Admissions Coordinator ?[phone] ?08/03/21  ?1:28 PM ? ?

## 2021-08-04 DIAGNOSIS — N179 Acute kidney failure, unspecified: Secondary | ICD-10-CM | POA: Diagnosis not present

## 2021-08-04 DIAGNOSIS — R9389 Abnormal findings on diagnostic imaging of other specified body structures: Secondary | ICD-10-CM | POA: Diagnosis not present

## 2021-08-04 DIAGNOSIS — R7989 Other specified abnormal findings of blood chemistry: Secondary | ICD-10-CM | POA: Diagnosis not present

## 2021-08-04 DIAGNOSIS — I469 Cardiac arrest, cause unspecified: Secondary | ICD-10-CM | POA: Diagnosis not present

## 2021-08-04 DIAGNOSIS — B9561 Methicillin susceptible Staphylococcus aureus infection as the cause of diseases classified elsewhere: Secondary | ICD-10-CM | POA: Diagnosis not present

## 2021-08-04 DIAGNOSIS — J9601 Acute respiratory failure with hypoxia: Secondary | ICD-10-CM | POA: Diagnosis not present

## 2021-08-04 DIAGNOSIS — R7881 Bacteremia: Secondary | ICD-10-CM | POA: Diagnosis not present

## 2021-08-04 LAB — BASIC METABOLIC PANEL
Anion gap: 18 — ABNORMAL HIGH (ref 5–15)
BUN: 151 mg/dL — ABNORMAL HIGH (ref 8–23)
CO2: 18 mmol/L — ABNORMAL LOW (ref 22–32)
Calcium: 7.7 mg/dL — ABNORMAL LOW (ref 8.9–10.3)
Chloride: 90 mmol/L — ABNORMAL LOW (ref 98–111)
Creatinine, Ser: 8.55 mg/dL — ABNORMAL HIGH (ref 0.61–1.24)
GFR, Estimated: 6 mL/min — ABNORMAL LOW (ref >60)
Glucose, Bld: 178 mg/dL — ABNORMAL HIGH (ref 70–99)
Potassium: 4.2 mmol/L (ref 3.5–5.1)
Sodium: 126 mmol/L — ABNORMAL LOW (ref 135–145)

## 2021-08-04 LAB — CBC
HCT: 24.4 % — ABNORMAL LOW (ref 39.0–52.0)
Hemoglobin: 8.6 g/dL — ABNORMAL LOW (ref 13.0–17.0)
MCH: 27.8 pg (ref 26.0–34.0)
MCHC: 35.2 g/dL (ref 30.0–36.0)
MCV: 79 fL — ABNORMAL LOW (ref 80.0–100.0)
Platelets: 183 10*3/uL (ref 150–400)
RBC: 3.09 MIL/uL — ABNORMAL LOW (ref 4.22–5.81)
RDW: 15.1 % (ref 11.5–15.5)
WBC: 13.6 10*3/uL — ABNORMAL HIGH (ref 4.0–10.5)
nRBC: 0.5 % — ABNORMAL HIGH (ref 0.0–0.2)

## 2021-08-04 LAB — CULTURE, RESPIRATORY W GRAM STAIN

## 2021-08-04 LAB — GLUCOSE, CAPILLARY
Glucose-Capillary: 164 mg/dL — ABNORMAL HIGH (ref 70–99)
Glucose-Capillary: 160 mg/dL — ABNORMAL HIGH (ref 70–99)
Glucose-Capillary: 160 mg/dL — ABNORMAL HIGH (ref 70–99)
Glucose-Capillary: 192 mg/dL — ABNORMAL HIGH (ref 70–99)

## 2021-08-04 LAB — HEPATITIS B SURFACE ANTIBODY, QUANTITATIVE: Hep B S AB Quant (Post): <3.1 m[IU]/mL — ABNORMAL LOW (ref >9.9)

## 2021-08-04 LAB — MAGNESIUM: Magnesium: 2.4 mg/dL (ref 1.7–2.4)

## 2021-08-04 MED ORDER — CEFAZOLIN SODIUM-DEXTROSE 1-4 GM/50ML-% IV SOLN
1.0000 g | INTRAVENOUS | Status: DC
  Administered 2021-08-04 – 2021-08-13 (×10): 1 g via INTRAVENOUS
  Filled 2021-08-04 (×12): qty 50

## 2021-08-04 MED ORDER — RENA-VITE PO TABS
1.0000 | ORAL_TABLET | Freq: Every day | ORAL | Status: DC
  Administered 2021-08-04 – 2021-08-13 (×10): 1 via ORAL
  Filled 2021-08-04 (×10): qty 1

## 2021-08-04 MED ORDER — NEPRO/CARBSTEADY PO LIQD
237.0000 mL | Freq: Two times a day (BID) | ORAL | Status: DC
Start: 2021-08-05 — End: 2021-08-14
  Administered 2021-08-06 – 2021-08-10 (×5): 237 mL via ORAL

## 2021-08-04 NOTE — Progress Notes (Signed)
Patient was unable to have HD catheter inserted because of an emergency case in IR. IR NP notified me and I sent a chat to Dr. Bonner Puna, Dr. Candiss Norse and SLP Juan Quam. HD called and notified that insertion cancelled and [name] be on schedule for tomorrow so they are aware Mr. [name] not need to be on their schedule today.  ?
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
## 2021-08-04 NOTE — Progress Notes (Signed)
Requested to see pt for out-pt HD needs. Pt currently on isolation for covid. Attempted to reach pt via phone but unable to speak with pt via phone. Attempted to reach pt's son via phone and had to leave a message requesting a return call. Attempted to reach pt's sign other via phone as well but there was no answer. Ordean await a return call from pt's son. Fleming assist as needed.  ? ?Melven Sartorius ?Renal Navigator ?[phone] ?

## 2021-08-04 NOTE — Progress Notes (Signed)
PHARMACY NOTE:  ANTIMICROBIAL RENAL DOSAGE ADJUSTMENT ? ?Current antimicrobial regimen includes a mismatch between antimicrobial dosage and estimated renal function.  As per policy approved by the Pharmacy & Therapeutics and Medical Executive Committees, the antimicrobial dosage Jarone be adjusted accordingly. ? ?Current antimicrobial dosage:  Cefazolin 1 gm every 12 hours  ? ?Indication: MSSA bacteremia ? ?Renal Function: ? ?Estimated Creatinine Clearance: 8.7 mL/min (A) (by C-G formula based on SCr of 8.55 mg/dL (H)). ?[]      On intermittent HD, scheduled: ?[]      On CRRT ?   ?Antimicrobial dosage has been changed to:  Cefazolin 1 gm every 24 hours  ? ?Additional comments: Renal function continues to worsen/ Plans to start HD soon  ? ? ?Thank you for allowing pharmacy to be a part of this patient's care. ? ?Jimmy Footman, PharmD, BCPS, BCIDP ?Infectious Diseases Clinical Pharmacist ?Phone: [phone] ?08/04/2021 8:06 AM ?

## 2021-08-04 NOTE — Significant Event (Signed)
Rapid Response Event Note  ? ?Reason for Call :  ?Asked to evaluate pt by Dr. Marlowe Sax d/t ?Aphasia. Per RN, pt was speaking on day shift for the day shift RN. On night shift assessment, pt is non-verbal but able to mouth words, shake head yes/no appropriately, and follow commands. ? ?Initial Focused Assessment:  ?Pt lying in bed with eyes closed. He Abimelec open eyes easily when spoken to, though sleepy. He Ayvin nod/shake his head appropriately to questions, mouth the answers to questions, and point with his hands to answer questions, but he Chasyn not speak. By nodding and shaking his head he is able to tell me that he is sad and doesn't want to speak. He has a flat affect. His arms move equally. His legs are very weak but he is able to wiggle his toes on command. NIH-8 for BLE weakness and LOC questions. He is able to write complete sentences on paper. ? ?T-98, HR-68, BP-115/73, RR-18, SpO2-94 on RA, CBG-192 ? ?Interventions:  ?No RRT interventions needed  ?Plan of Care:  ?Pt is not aphasic. He is able to communicate needs thru mouthing words and he can write. Pt is choosing not to speak. Continue to monitor pt. Call RRT if further assistance needed.  ? ?Event Summary:  ? ?MD Notified: Dr. Marlowe Sax ?Call WTKT:[phone] ?Arrival FDVO:[id number] ?End Time:2325 ? ?Dillard Essex, RN ?
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
## 2021-08-04 NOTE — Progress Notes (Addendum)
2227-Dr. Marlowe Sax notified about change with pt. Pt has become non verbal. Pt able to follow commands weaker upon movement and easily arouses. CBG check this shift 192. VS stable. Pt mouths answers and nods appropriately. Purposeful weak movements with all extremities. Pt being monitored very closely for further complications.  ? ?2230-Dr. Marlowe Sax is notifying Rapid Team to come and assess pt at bedside.  ? ?2245- Pt assessed by RRT and no new interventions needed for pt at this time. See chart for details on assessment. Pt [name] continue to be monitored very closely for any issues.  ?
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
## 2021-08-04 NOTE — Progress Notes (Signed)
SLP Cancellation Note ? ?Patient Details ?Name: [name]. ?MRN: [id number] ?DOB: 1951/03/01 ? ? ?Cancelled treatment:  Pt NPO for procedure - Omega f/u for swallowing when appropriate. ?D/W RN. ? ?Kisa Fujii L. Sevan Mcbroom, MA CCC/SLP ?Acute Rehabilitation Services ?Office number [phone] ?Pager [phone] ?       ? ? ?[name] ?08/04/2021, 9:36 AM ?

## 2021-08-04 NOTE — Progress Notes (Signed)
Patient  tentatively scheduled for tunneled hemodialysis catheter placement in Interventional Radiology  today. Please  reschedule for 3.22.23 secondary IR schedule ? ?RN  made aware. NPO orders placed for midnight. ?  ?This procedure can be scheduled as OP if needed ? ?

## 2021-08-04 NOTE — Progress Notes (Signed)
?[facility] ?Progress Note  ? ? ?Assessment/ Plan:   ? [name]. is a/an 71 y.o. male with a past medical history HFrEF, NICM, DM2, CKD (BL 2.7-3), HTN, and Afib who present after cardiac arrest ?  ?AKI on CKD 4: Likely secondary to ATN w/ cardiac arrest. Confusion largely driven by anoxic brain injury but some uremia is possible. Worsening kidney function (Cr has yet to peak), likely ESRD at this point ?-Appreciate IR's assistance. Have discussed with ID on 3/20 for [facility] placement and okay to proceed forward, bcx remain neg ?-Plan for IHD #1 once catheter is in plan--slow start protocol ?-Continue to monitor daily Cr, Dose meds for GFR ?-Monitor Daily I/Os, Daily weight  ?-Maintain MAP>65 for optimal renal perfusion.  ?-Avoid nephrotoxic medications including NSAIDs ?-Use synthetic opioids (Fentanyl/Dilaudid) if needed ?-Renal US w/o obstruction ?  ?Hyponatremia: Sodium 122.  Limit free water by mouth.  Plans for dialysis as above, 137Na bath/UF as tolerated ?  ?Anion gap Metabolic acidosis: 2/2 AKI. Na bicarb 1300mg  BID-can stop this once stable on HD ?  ?Cardiac arrest: LifeVest planned. Primary managing ?  ?Anoxic Brain injury: supportive care. GOC w/ family ?  ?NSVT: cardiology following. Amiodarone ?  ?CHF: likely some volume overload. Lasix 120mg  x2 doses 3/20 ?  ?Acute hypoxic respiratory failure: Likely multifactorial with COVID and some volume overload contributing.  Lasix and dialysis as above ?  ?MSSA bacteremia: Antibiotics per primary team.  on ancef ?  ?COVID-19: Continue management per primary team ?  ?Anemia: Management per primary-transfuse prn for hgb <7/  ?  ?Uncontrolled type 2 diabetes with hyperglycemia: Management per primary ?  ? ?Subjective:   ?No acute events, pending tunneled line placement   ? ?Objective:   ?BP 129/77 (BP Location: Left Arm)   Pulse 65   Temp 98 ?F (36.7 ?C) (Oral)   Resp 20   Ht 5\' 9"  (1.753 m)   Wt 88.7 kg   SpO2 94%   BMI 28.88 kg/m?   ? ?Intake/Output Summary (Last 24 hours) at 08/04/2021 1205 ?Last data filed at 08/04/2021 0454 ?Gross per 24 hour  ?Intake 170 ml  ?Output 2000 ml  ?Net -1830 ml  ? ?Weight change: -0.3 kg ? ?Physical Exam: ?FOY:DXAJOINOMVE ill appearing, NAD ?CVS:rrr ?Resp:normal wob, bibasilar crackles ?HMC:NOBS, nt/nd ?JGG:EZMOQ pitting edema b/l LE's ?Neuro: awake, moves all ext spontaneously, globally weak ? ?Imaging: ?ECHOCARDIOGRAM LIMITED ? ?Result Date: 08/03/2021 ?   ECHOCARDIOGRAM LIMITED REPORT   Patient Name:   [name]. Date of Exam: 08/03/2021 Medical Rec #:  [id number]         Height:       69.0 in Accession #:    [id number]        Weight:       196.2 lb Date of Birth:  [birth year]        BSA:          2.049 m? Patient Age:    70 years          BP:           138/77 mmHg Patient Gender: M                 HR:           66 bpm. Exam Location:  Inpatient Procedure: Limited Echo Indications:    Limited study for bacteremia  History:        Patient has prior history of Echocardiogram examinations, most  recent 07/28/2021. Arrythmias:Cardiac Arrest.  Sonographer:    Arlyss Gandy Referring Phys: [id number] [place]  1. Ventricual function similar to previous TTE 07/28/21.  2. Left ventricular ejection fraction, by estimation, is 30 to 35%. The left ventricle has moderately decreased function. The left ventricle demonstrates global hypokinesis. The left ventricular internal cavity size was moderately dilated.  3. Right ventricular systolic function is moderately reduced. The right ventricular size is moderately enlarged.  4. Left atrial size was moderately dilated.  5. Right atrial size was moderately dilated.  6. The aortic valve is tricuspid. FINDINGS  Left Ventricle: Left ventricular ejection fraction, by estimation, is 30 to 35%. The left ventricle has moderately decreased function. The left ventricle demonstrates global hypokinesis. The left ventricular internal cavity size was moderately  dilated. Right Ventricle: The right ventricular size is moderately enlarged. Right vetricular wall thickness was not assessed. Right ventricular systolic function is moderately reduced. Left Atrium: Left atrial size was moderately dilated. Right Atrium: Right atrial size was moderately dilated. Aortic Valve: The aortic valve is tricuspid. Additional Comments: Ventricual function similar to previous TTE 07/28/21. LEFT VENTRICLE PLAX 2D LVIDd:         5.20 cm LVIDs:         4.60 cm LV PW:         1.20 cm LV IVS:        1.30 cm  Jenkins Rouge MD Electronically signed by Jenkins Rouge MD Signature Date/Time: 08/03/2021/9:41:09 AM    Final    ? ?Labs: ?BMET ?Recent Labs  ?Lab 07/29/21 ?1333 07/30/21 ?4540 07/31/21 ?0241 08/01/21 ?0358 08/02/21 ?9811 08/03/21 ?0155 08/04/21 ?0320  ?NA 134* 133* 129* 130* 123* 122* 126*  ?K 3.8 4.3 4.1 4.5 5.0 5.0 4.2  ?CL 101 98 96* 95* 89* 88* 90*  ?CO2 20* 18* 19* 19* 16* 17* 18*  ?GLUCOSE 251* 197* 138* 139* 144* 109* 178*  ?BUN 62* 70* 80* 95* 109* 133* 151*  ?CREATININE 3.75* 4.29* 5.55* 6.79* 7.86* 8.17* 8.55*  ?CALCIUM 8.5* 8.3* 8.2* 8.1* 7.7* 7.4* 7.7*  ? ?CBC ?Recent Labs  ?Lab 07/29/21 ?9147 07/30/21 ?8295 08/01/21 ?0358 08/02/21 ?6213 08/03/21 ?0155 08/04/21 ?0320  ?WBC 14.2*   < > 21.9* 17.9* 16.4* 13.6*  ?NEUTROABS 12.0*  --   --   --   --   --   ?HGB 9.3*   < > 8.1* 8.2* 8.3* 8.6*  ?HCT 28.9*   < > 24.5* 23.3* 24.3* 24.4*  ?MCV 87.3   < > 84.2 80.9 79.7* 79.0*  ?PLT 164   < > 114* 131* 179 183  ? < > = values in this interval not displayed.  ? ? ?Medications:   ? ? acetaminophen  1,000 mg Oral TID  ? amiodarone  200 mg Oral BID  ? Chlorhexidine Gluconate Cloth  6 each Topical Q0600  ? guaiFENesin  600 mg Oral BID  ? insulin aspart  0-15 Units Subcutaneous TID WC  ? lidocaine  3 patch Transdermal Q24H  ? mouth rinse  15 mL Mouth Rinse BID  ? sodium bicarbonate  1,300 mg Oral BID  ? ? ? ? ?Gean Quint, MD ?[facility] ?08/04/2021, 12:05 PM  ? ?

## 2021-08-05 ENCOUNTER — Inpatient Hospital Stay (HOSPITAL_COMMUNITY): Payer: Medicare PPO

## 2021-08-05 ENCOUNTER — Encounter (HOSPITAL_COMMUNITY): Payer: Self-pay | Admitting: Interventional Radiology

## 2021-08-05 DIAGNOSIS — R9389 Abnormal findings on diagnostic imaging of other specified body structures: Secondary | ICD-10-CM | POA: Diagnosis not present

## 2021-08-05 DIAGNOSIS — R7881 Bacteremia: Secondary | ICD-10-CM | POA: Diagnosis not present

## 2021-08-05 DIAGNOSIS — J9601 Acute respiratory failure with hypoxia: Secondary | ICD-10-CM | POA: Diagnosis not present

## 2021-08-05 DIAGNOSIS — B9561 Methicillin susceptible Staphylococcus aureus infection as the cause of diseases classified elsewhere: Secondary | ICD-10-CM | POA: Diagnosis not present

## 2021-08-05 DIAGNOSIS — I469 Cardiac arrest, cause unspecified: Secondary | ICD-10-CM | POA: Diagnosis not present

## 2021-08-05 DIAGNOSIS — R7989 Other specified abnormal findings of blood chemistry: Secondary | ICD-10-CM | POA: Diagnosis not present

## 2021-08-05 DIAGNOSIS — E440 Moderate protein-calorie malnutrition: Secondary | ICD-10-CM | POA: Insufficient documentation

## 2021-08-05 HISTORY — PX: IR FLUORO GUIDE CV LINE RIGHT: IMG2283

## 2021-08-05 HISTORY — PX: IR US GUIDE VASC ACCESS RIGHT: IMG2390

## 2021-08-05 LAB — BASIC METABOLIC PANEL
Anion gap: 17 — ABNORMAL HIGH (ref 5–15)
BUN: 153 mg/dL — ABNORMAL HIGH (ref 8–23)
CO2: 22 mmol/L (ref 22–32)
Calcium: 7.9 mg/dL — ABNORMAL LOW (ref 8.9–10.3)
Chloride: 91 mmol/L — ABNORMAL LOW (ref 98–111)
Creatinine, Ser: 8.01 mg/dL — ABNORMAL HIGH (ref 0.61–1.24)
GFR, Estimated: 7 mL/min — ABNORMAL LOW (ref >60)
Glucose, Bld: 220 mg/dL — ABNORMAL HIGH (ref 70–99)
Potassium: 3.9 mmol/L (ref 3.5–5.1)
Sodium: 130 mmol/L — ABNORMAL LOW (ref 135–145)

## 2021-08-05 LAB — MAGNESIUM: Magnesium: 2.5 mg/dL — ABNORMAL HIGH (ref 1.7–2.4)

## 2021-08-05 LAB — GLUCOSE, CAPILLARY
Glucose-Capillary: 194 mg/dL — ABNORMAL HIGH (ref 70–99)
Glucose-Capillary: 174 mg/dL — ABNORMAL HIGH (ref 70–99)
Glucose-Capillary: 133 mg/dL — ABNORMAL HIGH (ref 70–99)
Glucose-Capillary: 155 mg/dL — ABNORMAL HIGH (ref 70–99)

## 2021-08-05 LAB — CBC
HCT: 24 % — ABNORMAL LOW (ref 39.0–52.0)
Hemoglobin: 8.3 g/dL — ABNORMAL LOW (ref 13.0–17.0)
MCH: 27.1 pg (ref 26.0–34.0)
MCHC: 34.6 g/dL (ref 30.0–36.0)
MCV: 78.4 fL — ABNORMAL LOW (ref 80.0–100.0)
Platelets: 181 10*3/uL (ref 150–400)
RBC: 3.06 MIL/uL — ABNORMAL LOW (ref 4.22–5.81)
RDW: 14.9 % (ref 11.5–15.5)
WBC: 14.8 10*3/uL — ABNORMAL HIGH (ref 4.0–10.5)
nRBC: 0.3 % — ABNORMAL HIGH (ref 0.0–0.2)

## 2021-08-05 MED ORDER — MIDAZOLAM HCL 2 MG/2ML IJ SOLN
INTRAMUSCULAR | Status: AC | PRN
  Administered 2021-08-05 (×2): .5 mg via INTRAVENOUS

## 2021-08-05 MED ORDER — HEPARIN SODIUM (PORCINE) 1000 UNIT/ML IJ SOLN
INTRAMUSCULAR | Status: AC
  Filled 2021-08-05: qty 10

## 2021-08-05 MED ORDER — LIDOCAINE HCL (PF) 1 % IJ SOLN
INTRAMUSCULAR | Status: AC | PRN
Start: 2021-08-05 — End: 2021-08-05
  Administered 2021-08-05: 10 mg

## 2021-08-05 MED ORDER — CEFAZOLIN SODIUM-DEXTROSE 2-4 GM/100ML-% IV SOLN
INTRAVENOUS | Status: AC | PRN
  Administered 2021-08-05: 2 g via INTRAVENOUS

## 2021-08-05 MED ORDER — LIDOCAINE HCL 1 % IJ SOLN
INTRAMUSCULAR | Status: AC
Start: 2021-08-05 — End: 2021-08-06
  Filled 2021-08-05: qty 20

## 2021-08-05 MED ORDER — MIDAZOLAM HCL 2 MG/2ML IJ SOLN
INTRAMUSCULAR | Status: AC
  Filled 2021-08-05: qty 2

## 2021-08-05 MED ORDER — LIDOCAINE HCL 1 % IJ SOLN
INTRAMUSCULAR | Status: AC
  Filled 2021-08-05: qty 20

## 2021-08-05 MED ORDER — CEFAZOLIN SODIUM-DEXTROSE 2-4 GM/100ML-% IV SOLN
INTRAVENOUS | Status: AC
  Filled 2021-08-05: qty 100
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
## 2021-08-05 NOTE — Sedation Documentation (Signed)
Patient is resting comfortably. VSS °
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
## 2021-08-05 NOTE — Progress Notes (Signed)
Pt becoming more verbal with staff this morning. Pt seems more awake as well.  ?
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
## 2021-08-05 NOTE — Procedures (Signed)
Interventional Radiology Procedure Note ? ?Procedure: Right IJ 19 cm Palindrome tunneled HD catheter.  Cath tips in the RA and device ready for use.  ? ?Complications: None ? ?Estimated Blood Loss: None ? ?Recommendations: ?- Routine line care ? ? ?Signed, ? ?Criselda Peaches, MD ? ? ?

## 2021-08-05 NOTE — Plan of Care (Signed)
?  Problem: Safety: ?Goal: Non-violent Restraint(s) ?Outcome: Progressing ?  ?Problem: Clinical Measurements: ?Goal: Respiratory complications Jerick improve ?Outcome: Progressing ?Goal: Cardiovascular complication Keishawn be avoided ?Outcome: Progressing ?  ?Problem: Coping: ?Goal: Level of anxiety Dionte decrease ?Outcome: Not Progressing ?  ?Problem: Elimination: ?Goal: Carrie not experience complications related to urinary retention ?Outcome: Progressing ?  ?Problem: Pain Managment: ?Goal: General experience of comfort Syon improve ?Outcome: Progressing ?  ?Problem: Safety: ?Goal: Ability to remain free from injury Merrick improve ?Outcome: Progressing ?  ?

## 2021-08-05 NOTE — Progress Notes (Signed)
Spoke to pt's son via phone late yesterday afternoon. Navigator sent clinic info to pt's son this am via text per his request. Son to review clinic options and return call to navigator with clinic preference so referral can be made for out-pt HD. Erique assist as needed. ? ?Melven Sartorius ?Renal Navigator ?[phone] ?

## 2021-08-05 NOTE — Sedation Documentation (Signed)
Pt tolerated procedure well.  ? ?1mg  versed given ?17 mins total time. Report given to floor rn ?
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
## 2021-08-05 NOTE — Sedation Documentation (Signed)
Patient is resting comfortably. 
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
## 2021-08-05 NOTE — Sedation Documentation (Signed)
Vital signs stable. 
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
## 2021-08-06 DIAGNOSIS — R7881 Bacteremia: Secondary | ICD-10-CM | POA: Diagnosis not present

## 2021-08-06 DIAGNOSIS — I469 Cardiac arrest, cause unspecified: Secondary | ICD-10-CM | POA: Diagnosis not present

## 2021-08-06 DIAGNOSIS — G9340 Encephalopathy, unspecified: Secondary | ICD-10-CM | POA: Diagnosis not present

## 2021-08-06 DIAGNOSIS — B9561 Methicillin susceptible Staphylococcus aureus infection as the cause of diseases classified elsewhere: Secondary | ICD-10-CM | POA: Diagnosis not present

## 2021-08-06 LAB — CBC WITH DIFFERENTIAL/PLATELET
Abs Immature Granulocytes: 0.76 10*3/uL — ABNORMAL HIGH (ref 0.00–0.07)
Basophils Absolute: 0 10*3/uL (ref 0.0–0.1)
Basophils Relative: 0 %
Eosinophils Absolute: 0 10*3/uL (ref 0.0–0.5)
Eosinophils Relative: 0 %
HCT: 25.8 % — ABNORMAL LOW (ref 39.0–52.0)
Hemoglobin: 9 g/dL — ABNORMAL LOW (ref 13.0–17.0)
Immature Granulocytes: 5 %
Lymphocytes Relative: 5 %
Lymphs Abs: 0.9 10*3/uL (ref 0.7–4.0)
MCH: 27.7 pg (ref 26.0–34.0)
MCHC: 34.9 g/dL (ref 30.0–36.0)
MCV: 79.4 fL — ABNORMAL LOW (ref 80.0–100.0)
Monocytes Absolute: 1.9 10*3/uL — ABNORMAL HIGH (ref 0.1–1.0)
Monocytes Relative: 12 %
Neutro Abs: 12.2 10*3/uL — ABNORMAL HIGH (ref 1.7–7.7)
Neutrophils Relative %: 78 %
Platelets: 200 10*3/uL (ref 150–400)
RBC: 3.25 MIL/uL — ABNORMAL LOW (ref 4.22–5.81)
RDW: 15.4 % (ref 11.5–15.5)
WBC: 15.7 10*3/uL — ABNORMAL HIGH (ref 4.0–10.5)
nRBC: 0.2 % (ref 0.0–0.2)

## 2021-08-06 LAB — COMPREHENSIVE METABOLIC PANEL
ALT: 7 U/L (ref 0–44)
AST: 23 U/L (ref 15–41)
Albumin: 2.1 g/dL — ABNORMAL LOW (ref 3.5–5.0)
Alkaline Phosphatase: 94 U/L (ref 38–126)
Anion gap: 15 (ref 5–15)
BUN: 126 mg/dL — ABNORMAL HIGH (ref 8–23)
CO2: 26 mmol/L (ref 22–32)
Calcium: 8 mg/dL — ABNORMAL LOW (ref 8.9–10.3)
Chloride: 95 mmol/L — ABNORMAL LOW (ref 98–111)
Creatinine, Ser: 6.36 mg/dL — ABNORMAL HIGH (ref 0.61–1.24)
GFR, Estimated: 9 mL/min — ABNORMAL LOW (ref >60)
Glucose, Bld: 184 mg/dL — ABNORMAL HIGH (ref 70–99)
Potassium: 3.5 mmol/L (ref 3.5–5.1)
Sodium: 136 mmol/L (ref 135–145)
Total Bilirubin: 0.9 mg/dL (ref 0.3–1.2)
Total Protein: 6.4 g/dL — ABNORMAL LOW (ref 6.5–8.1)

## 2021-08-06 LAB — GLUCOSE, CAPILLARY
Glucose-Capillary: 174 mg/dL — ABNORMAL HIGH (ref 70–99)
Glucose-Capillary: 239 mg/dL — ABNORMAL HIGH (ref 70–99)
Glucose-Capillary: 218 mg/dL — ABNORMAL HIGH (ref 70–99)
Glucose-Capillary: 242 mg/dL — ABNORMAL HIGH (ref 70–99)

## 2021-08-06 MED ORDER — APIXABAN 5 MG PO TABS
5.0000 mg | ORAL_TABLET | Freq: Two times a day (BID) | ORAL | Status: DC
Start: 2021-08-06 — End: 2021-08-11
  Administered 2021-08-06 – 2021-08-11 (×11): 5 mg via ORAL
  Filled 2021-08-06 (×12): qty 1

## 2021-08-06 NOTE — Progress Notes (Signed)
Contacted pt's son this am to inquire if he has made a determination regarding clinic of choice for out-pt HD. Son continues to research options and to f/u with navigator with decision. ? ?Melven Sartorius ?Renal Navigator ?[phone] ?

## 2021-08-06 NOTE — Progress Notes (Signed)
Talk extensively with pt's son Linton Rump at bedside with pt. Pt and son informed on the plan of care for patient at this time. Pt and son fully understands the purpose of Dialysis. Pt is less drowsy this shift and occasionally speaks. Informed by pt's son that pt has a history of "shutting down" along with nonverbal behavior when going through major issues. Pt nods and agrees there is no issue with speaking and just does not want to talk much. Reassured pt that all staff involved in caring for pt is doing everything possible to help. Gor continue to reassure pt.  ?

## 2021-08-06 NOTE — Progress Notes (Signed)
Referring Physician(s): * No referring provider recorded for this case *  Supervising Physician: Gilmer Mor  Patient Status:  [facility] - In-pt  Chief Complaint: Bleeding around [facility] placed 08/05/21 by IR  Subjective: [name], RN called and stated pt TDC placed yesterday had been oozing blood all night. Requests IR to assist with dressing change and assess site.   Allergies: Patient has no known allergies.  Medications: Prior to Admission medications   Medication Sig Start Date End Date Taking? Authorizing Provider  carvedilol (COREG) 25 MG tablet Take 25 mg by mouth 2 (two) times daily. 06/17/21  Yes [provider]  hydrALAZINE (APRESOLINE) 25 MG tablet Take 25 mg by mouth 3 (three) times daily. 07/20/21  Yes [provider]  isosorbide mononitrate (IMDUR) 30 MG 24 hr tablet Take 30 mg by mouth daily. 07/20/21  Yes [provider]  torsemide (DEMADEX) 20 MG tablet Take 1-2 tablets by mouth See admin instructions. Take 2 tablets by mouth in the morning and then take 1 tablet by mouth in the evening 07/20/21  Yes [provider]     Vital Signs: BP 114/75 (BP Location: Left Arm)   Pulse 76   Temp 98.5 F (36.9 C) (Oral)   Resp 18   Ht 5\' 9"  (1.753 m)   Wt 189 lb 9.5 oz (86 kg)   SpO2 98%   BMI 28.00 kg/m   Physical Exam Constitutional:      Appearance: He is not ill-appearing.  Pulmonary:     Effort: Pulmonary effort is normal. No respiratory distress.  Skin:    Comments: TDC to R upper chest with large amount of gauze and abdominal pads saturated with blood. Insertion site has slow oozing of blood.  There is no redness, swelling or other signs of infection.   Neurological:     Mental Status: He is alert.  Psychiatric:        Mood and Affect: Mood normal.    Imaging: IR Fluoro Guide CV Line Right  Result Date: 08/05/2021 INDICATION: 71 year old male in need of hemodialysis. He presents for tunneled HD catheter placement. EXAM: TUNNELED  CENTRAL VENOUS HEMODIALYSIS CATHETER PLACEMENT WITH ULTRASOUND AND FLUOROSCOPIC GUIDANCE MEDICATIONS: 2 g Ancef. The antibiotic was given in an appropriate time interval prior to skin puncture. ANESTHESIA/SEDATION: 1 mg Versed administered for anxiolysis. FLUOROSCOPY TIME:  4 mGy reference air kerma COMPLICATIONS: None immediate. PROCEDURE: Informed written consent was obtained from the patient after a discussion of the risks, benefits, and alternatives to treatment. Questions regarding the procedure were encouraged and answered. The right neck and chest were prepped with chlorhexidine in a sterile fashion, and a sterile drape was applied covering the operative field. Maximum barrier sterile technique with sterile gowns and gloves were used for the procedure. A timeout was performed prior to the initiation of the procedure. After creating a small venotomy incision, a micropuncture kit was utilized to access the right internal jugular vein under direct, real-time ultrasound guidance after the overlying soft tissues were anesthetized with 1% lidocaine with epinephrine. Ultrasound image documentation was performed. The microwire was kinked to measure appropriate catheter length. A stiff Glidewire was advanced to the level of the IVC and the micropuncture sheath was exchanged for a peel-away sheath. A palindrome tunneled hemodialysis catheter measuring 19 cm from tip to cuff was tunneled in a retrograde fashion from the anterior chest wall to the venotomy incision. The catheter was then placed through the peel-away sheath with tips ultimately positioned within the  superior aspect of the right atrium. Final catheter positioning was confirmed and documented with a spot radiographic image. The catheter aspirates and flushes normally. The catheter was flushed with appropriate volume heparin dwells. The catheter exit site was secured with a 0-Prolene retention suture. The venotomy incision was closed with an interrupted 4-0  Vicryl, Dermabond and Steri-strips. Dressings were applied. The patient tolerated the procedure well without immediate post procedural complication. IMPRESSION: Successful placement of 19 cm tip to cuff tunneled hemodialysis catheter via the right internal jugular vein with tips terminating within the superior aspect of the right atrium. The catheter is ready for immediate use. Electronically Signed   By: Malachy Moan M.D.   On: 08/05/2021 17:44   IR US Guide Vasc Access Right  Result Date: 08/05/2021 INDICATION: 71 year old male in need of hemodialysis. He presents for tunneled HD catheter placement. EXAM: TUNNELED CENTRAL VENOUS HEMODIALYSIS CATHETER PLACEMENT WITH ULTRASOUND AND FLUOROSCOPIC GUIDANCE MEDICATIONS: 2 g Ancef. The antibiotic was given in an appropriate time interval prior to skin puncture. ANESTHESIA/SEDATION: 1 mg Versed administered for anxiolysis. FLUOROSCOPY TIME:  4 mGy reference air kerma COMPLICATIONS: None immediate. PROCEDURE: Informed written consent was obtained from the patient after a discussion of the risks, benefits, and alternatives to treatment. Questions regarding the procedure were encouraged and answered. The right neck and chest were prepped with chlorhexidine in a sterile fashion, and a sterile drape was applied covering the operative field. Maximum barrier sterile technique with sterile gowns and gloves were used for the procedure. A timeout was performed prior to the initiation of the procedure. After creating a small venotomy incision, a micropuncture kit was utilized to access the right internal jugular vein under direct, real-time ultrasound guidance after the overlying soft tissues were anesthetized with 1% lidocaine with epinephrine. Ultrasound image documentation was performed. The microwire was kinked to measure appropriate catheter length. A stiff Glidewire was advanced to the level of the IVC and the micropuncture sheath was exchanged for a peel-away sheath.  A palindrome tunneled hemodialysis catheter measuring 19 cm from tip to cuff was tunneled in a retrograde fashion from the anterior chest wall to the venotomy incision. The catheter was then placed through the peel-away sheath with tips ultimately positioned within the superior aspect of the right atrium. Final catheter positioning was confirmed and documented with a spot radiographic image. The catheter aspirates and flushes normally. The catheter was flushed with appropriate volume heparin dwells. The catheter exit site was secured with a 0-Prolene retention suture. The venotomy incision was closed with an interrupted 4-0 Vicryl, Dermabond and Steri-strips. Dressings were applied. The patient tolerated the procedure well without immediate post procedural complication. IMPRESSION: Successful placement of 19 cm tip to cuff tunneled hemodialysis catheter via the right internal jugular vein with tips terminating within the superior aspect of the right atrium. The catheter is ready for immediate use. Electronically Signed   By: Malachy Moan M.D.   On: 08/05/2021 17:44   ECHOCARDIOGRAM LIMITED  Result Date: 08/03/2021    ECHOCARDIOGRAM LIMITED REPORT   Patient Name:   [name]. Date of Exam: 08/03/2021 Medical Rec #:  [id number]         Height:       69.0 in Accession #:    [id number]        Weight:       196.2 lb Date of Birth:  25-Nov-1950        BSA:          2.049 m  Patient Age:    70 years          BP:           138/77 mmHg Patient Gender: M                 HR:           66 bpm. Exam Location:  Inpatient Procedure: Limited Echo Indications:    Limited study for bacteremia  History:        Patient has prior history of Echocardiogram examinations, most                 recent 07/28/2021. Arrythmias:Cardiac Arrest.  Sonographer:    Devonne Doughty Referring Phys: [id number] TRUNG T VU IMPRESSIONS  1. Ventricual function similar to previous TTE 07/28/21.  2. Left ventricular ejection fraction, by estimation, is  30 to 35%. The left ventricle has moderately decreased function. The left ventricle demonstrates global hypokinesis. The left ventricular internal cavity size was moderately dilated.  3. Right ventricular systolic function is moderately reduced. The right ventricular size is moderately enlarged.  4. Left atrial size was moderately dilated.  5. Right atrial size was moderately dilated.  6. The aortic valve is tricuspid. FINDINGS  Left Ventricle: Left ventricular ejection fraction, by estimation, is 30 to 35%. The left ventricle has moderately decreased function. The left ventricle demonstrates global hypokinesis. The left ventricular internal cavity size was moderately dilated. Right Ventricle: The right ventricular size is moderately enlarged. Right vetricular wall thickness was not assessed. Right ventricular systolic function is moderately reduced. Left Atrium: Left atrial size was moderately dilated. Right Atrium: Right atrial size was moderately dilated. Aortic Valve: The aortic valve is tricuspid. Additional Comments: Ventricual function similar to previous TTE 07/28/21. LEFT VENTRICLE PLAX 2D LVIDd:         5.20 cm LVIDs:         4.60 cm LV PW:         1.20 cm LV IVS:        1.30 cm  Charlton Haws MD Electronically signed by Charlton Haws MD Signature Date/Time: 08/03/2021/9:41:09 AM    Final     Labs:  CBC: Recent Labs    08/03/21 0155 08/04/21 0320 08/05/21 0254 08/06/21 0727  WBC 16.4* 13.6* 14.8* 15.7*  HGB 8.3* 8.6* 8.3* 9.0*  HCT 24.3* 24.4* 24.0* 25.8*  PLT 179 183 181 200    COAGS: Recent Labs    07/27/21 2205  INR 1.5*    BMP: Recent Labs    08/03/21 0155 08/04/21 0320 08/05/21 0254 08/06/21 0727  NA 122* 126* 130* 136  K 5.0 4.2 3.9 3.5  CL 88* 90* 91* 95*  CO2 17* 18* 22 26  GLUCOSE 109* 178* 220* 184*  BUN 133* 151* 153* 126*  CALCIUM 7.4* 7.7* 7.9* 8.0*  CREATININE 8.17* 8.55* 8.01* 6.36*  GFRNONAA 6* 6* 7* 9*    LIVER FUNCTION TESTS: Recent Labs     07/27/21 2205 07/30/21 0335 08/01/21 0358 08/06/21 0727  BILITOT 0.7 1.0 0.9 0.9  AST 92* 49* 26 23  ALT 69* 49* 34 7  ALKPHOS 99 70 76 94  PROT 7.3 6.9 6.3* 6.4*  ALBUMIN 3.1* 2.8* 2.4* 2.1*    Assessment and Plan:  [name], RN called and stated pt TDC placed yesterday had been oozing blood all night. Requests IR to assist with dressing change and assess site.   Pt non-verbal but pleasant. He is alert.  Large amount of gauze and  abdominal pads saturated with blood were removed. Area was cleaned.  Small amount of oozing noted at insertion site. Direct pressure held at [facility] site.  Small rolls of quik clot were placed and removed around insertion site until hemostasis was obtained.  No bleeding/oozing noted.  Site was dressed. Pt tolerated well.   RN to monitor for bleeding and then replace dressing.   Hgb 9.0 (8.3)   Please call IR with questions or concerns.   Electronically Signed: Shon Hough, NP 08/06/2021, 10:41 AM   I spent a total of 25 Minutes at the the patient's bedside AND on the patient's hospital floor or unit, greater than 50% of which was counseling/coordinating care for bleeding around [facility] placed 08/05/21 by IR.
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
## 2021-08-06 NOTE — Care Management Important Message (Signed)
Important Message ? ?Patient Details  ?Name: [name]. ?MRN: [id number] ?Date of Birth: Sep 26, 1950 ? ? ?Medicare Important Message Given:  Yes ? ? ? ? ?[name] ?08/06/2021, 9:27 AM ?

## 2021-08-06 NOTE — Progress Notes (Signed)
?[facility] ?Progress Note  ? ? ?Assessment/ Plan:   ? Lucas Bostrom. is a/an 71 y.o. male with a past medical history HFrEF, NICM, DM2, CKD (BL 2.7-3), HTN, and Afib who present after cardiac arrest ?  ?AKI on CKD 4: Likely secondary to ATN w/ cardiac arrest. Confusion/mental status largely driven by anoxic brain injury but some uremia is possible--Hammad see if there is any further improvement with HD. 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 HD #1 3/22, plan for #2 today. Doctor monitor thereafter. He is making more urine which is reassuring, likely in recovery phase but not sure how much meaningful renal recovery he is going to have with pre-existing CKD4 ?-Continue to monitor daily Cr, Dose meds for GFR ?-Monitor Daily I/Os, Daily weight  ?-Maintain MAP>65 for optimal renal perfusion.  ?-Avoid nephrotoxic medications including NSAIDs ?-Use synthetic opioids (Fentanyl/Dilaudid) if needed ?-Renal US w/o obstruction ?  ?Hyponatremia: Sodium improved. 136 today.  Limit free water by mouth.  Plans for dialysis as above, 137Na bath/UF as tolerated ?  ?Anion gap Metabolic acidosis: 2/2 AKI.Bicarb WNL off nahco3 ?  ?Cardiac arrest: LifeVest planned. Primary managing ?  ?Anoxic Brain injury: supportive care. GOC w/ family ?  ?NSVT: cardiology following. Amiodarone ?  ?CHF: likely some volume overload. Lasix 159m x2 doses 3/20 ?  ?Acute hypoxic respiratory failure: Likely multifactorial with COVID and some volume overload contributing.  UF as tolerated ?  ?MSSA bacteremia: Antibiotics per primary team and ID.  on ancef ?  ?COVID-19: Continue management per primary team ?  ?Anemia: Management per primary-transfuse prn for hgb <7/  ?  ?Uncontrolled type 2 diabetes with hyperglycemia: Management per primary ?  ? ?Subjective:   ?No acute events overnight. Tolerated HD#1 yesterday with net UF 0cc. Remains nonverbal today but  interactive. When asked if he feels bettr as compared to yesterday, he does nod his head. Urine output 1850cc  ? ?Objective:   ?BP 114/75 (BP Location: Left Arm)   Pulse 76   Temp 98.5 ?F (36.9 ?C) (Oral)   Resp 18   Ht _0  (1.753 m)   Wt 86 kg   SpO2 98%   BMI 28.00 kg/m?  ? ?Intake/Output Summary (Last 24 hours) at 08/06/2021 1046 ?Last data filed at 08/06/2021 1031 ?Gross per 24 hour  ?Intake 530.03 ml  ?Output 2150 ml  ?Net -1619.97 ml  ? ?Weight change: 0 kg ? ?Physical Exam: ?GWOE:HOZYYQMGNOIill appearing, NAD ?CVS:rrr ?Resp:normal wob ?ABBC:WUGQ nt/nd ?EBVQ:XIHWTpitting edema b/l LE's ?Neuro: awake, globally weak, following commands/interactive ? ?Imaging: ?IR Fluoro Guide CV Line Right ? ?Result Date: 08/05/2021 ?INDICATION: 71year old male in need of hemodialysis. He presents for tunneled HD catheter placement. EXAM: TUNNELED CENTRAL VENOUS HEMODIALYSIS CATHETER PLACEMENT WITH ULTRASOUND AND FLUOROSCOPIC GUIDANCE MEDICATIONS: 2 g Ancef. The antibiotic was given in an appropriate time interval prior to skin puncture. ANESTHESIA/SEDATION: 1 mg Versed administered for anxiolysis. FLUOROSCOPY TIME:  4 mGy reference air kerma COMPLICATIONS: None immediate. PROCEDURE: Informed written consent was obtained from the patient after a discussion of the risks, benefits, and alternatives to treatment. Questions regarding the procedure were encouraged and answered. The right neck and chest were prepped with chlorhexidine in a sterile fashion, and a sterile drape was applied covering the operative field. Maximum barrier sterile technique with sterile gowns and gloves were used for the procedure. A timeout was performed prior to the initiation of  the procedure. After creating a small venotomy incision, a micropuncture kit was utilized to access the right internal jugular vein under direct, real-time ultrasound guidance after the overlying soft tissues were anesthetized with 1% lidocaine with epinephrine. Ultrasound  image documentation was performed. The microwire was kinked to measure appropriate catheter length. A stiff Glidewire was advanced to the level of the IVC and the micropuncture sheath was exchanged for a peel-away sheath. A palindrome tunneled hemodialysis catheter measuring 19 cm from tip to cuff was tunneled in a retrograde fashion from the anterior chest wall to the venotomy incision. The catheter was then placed through the peel-away sheath with tips ultimately positioned within the superior aspect of the right atrium. Final catheter positioning was confirmed and documented with a spot radiographic image. The catheter aspirates and flushes normally. The catheter was flushed with appropriate volume heparin dwells. The catheter exit site was secured with a 0-Prolene retention suture. The venotomy incision was closed with an interrupted 4-0 Vicryl, Dermabond and Steri-strips. Dressings were applied. The patient tolerated the procedure well without immediate post procedural complication. IMPRESSION: Successful placement of 19 cm tip to cuff tunneled hemodialysis catheter via the right internal jugular vein with tips terminating within the superior aspect of the right atrium. The catheter is ready for immediate use. Electronically Signed   By: Jacqulynn Cadet M.D.   On: 08/05/2021 17:44  ? ?IR US Guide Vasc Access Right ? ?Result Date: 08/05/2021 ?INDICATION: 71 year old male in need of hemodialysis. He presents for tunneled HD catheter placement. EXAM: TUNNELED CENTRAL VENOUS HEMODIALYSIS CATHETER PLACEMENT WITH ULTRASOUND AND FLUOROSCOPIC GUIDANCE MEDICATIONS: 2 g Ancef. The antibiotic was given in an appropriate time interval prior to skin puncture. ANESTHESIA/SEDATION: 1 mg Versed administered for anxiolysis. FLUOROSCOPY TIME:  4 mGy reference air kerma COMPLICATIONS: None immediate. PROCEDURE: Informed written consent was obtained from the patient after a discussion of the risks, benefits, and alternatives to  treatment. Questions regarding the procedure were encouraged and answered. The right neck and chest were prepped with chlorhexidine in a sterile fashion, and a sterile drape was applied covering the operative field. Maximum barrier sterile technique with sterile gowns and gloves were used for the procedure. A timeout was performed prior to the initiation of the procedure. After creating a small venotomy incision, a micropuncture kit was utilized to access the right internal jugular vein under direct, real-time ultrasound guidance after the overlying soft tissues were anesthetized with 1% lidocaine with epinephrine. Ultrasound image documentation was performed. The microwire was kinked to measure appropriate catheter length. A stiff Glidewire was advanced to the level of the IVC and the micropuncture sheath was exchanged for a peel-away sheath. A palindrome tunneled hemodialysis catheter measuring 19 cm from tip to cuff was tunneled in a retrograde fashion from the anterior chest wall to the venotomy incision. The catheter was then placed through the peel-away sheath with tips ultimately positioned within the superior aspect of the right atrium. Final catheter positioning was confirmed and documented with a spot radiographic image. The catheter aspirates and flushes normally. The catheter was flushed with appropriate volume heparin dwells. The catheter exit site was secured with a 0-Prolene retention suture. The venotomy incision was closed with an interrupted 4-0 Vicryl, Dermabond and Steri-strips. Dressings were applied. The patient tolerated the procedure well without immediate post procedural complication. IMPRESSION: Successful placement of 19 cm tip to cuff tunneled hemodialysis catheter via the right internal jugular vein with tips terminating within the superior aspect of the right atrium. The catheter  is ready for immediate use. Electronically Signed   By: Jacqulynn Cadet M.D.   On: 08/05/2021 17:44    ? ?Labs: ?BMET ?Recent Labs  ?Lab 07/31/21 ?0241 08/01/21 ?0358 08/02/21 ?6431 08/03/21 ?0155 08/04/21 ?0320 08/05/21 ?4276 08/06/21 ?7011  ?NA 129* 130* 123* 122* 126* 130* 136  ?K 4.1 4.5 5.0 5.0 4.2 3.9 3.5  ?C
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
## 2021-08-06 NOTE — Progress Notes (Signed)
This chaplain attempted F/U spiritual care visit with the Pt. The chaplain is appreciative of RN-[name]'s update on the Pt. participation in HD. The chaplain Kendrick attempt a re-visit at the best time for the Pt. and chaplain. ? ?Chaplain Sallyanne Kuster ?[phone] ?

## 2021-08-07 DIAGNOSIS — I469 Cardiac arrest, cause unspecified: Secondary | ICD-10-CM | POA: Diagnosis not present

## 2021-08-07 LAB — GLUCOSE, CAPILLARY
Glucose-Capillary: 330 mg/dL — ABNORMAL HIGH (ref 70–99)
Glucose-Capillary: 269 mg/dL — ABNORMAL HIGH (ref 70–99)
Glucose-Capillary: 273 mg/dL — ABNORMAL HIGH (ref 70–99)
Glucose-Capillary: 147 mg/dL — ABNORMAL HIGH (ref 70–99)
Glucose-Capillary: 213 mg/dL — ABNORMAL HIGH (ref 70–99)

## 2021-08-07 LAB — RENAL FUNCTION PANEL
Albumin: 2.1 g/dL — ABNORMAL LOW (ref 3.5–5.0)
Anion gap: 13 (ref 5–15)
BUN: 100 mg/dL — ABNORMAL HIGH (ref 8–23)
CO2: 26 mmol/L (ref 22–32)
Calcium: 8.2 mg/dL — ABNORMAL LOW (ref 8.9–10.3)
Chloride: 98 mmol/L (ref 98–111)
Creatinine, Ser: 5.26 mg/dL — ABNORMAL HIGH (ref 0.61–1.24)
GFR, Estimated: 11 mL/min — ABNORMAL LOW (ref >60)
Glucose, Bld: 353 mg/dL — ABNORMAL HIGH (ref 70–99)
Phosphorus: 4.7 mg/dL — ABNORMAL HIGH (ref 2.5–4.6)
Potassium: 3.8 mmol/L (ref 3.5–5.1)
Sodium: 137 mmol/L (ref 135–145)

## 2021-08-07 LAB — CULTURE, BLOOD (ROUTINE X 2)
Culture: NO GROWTH
Culture: NO GROWTH
Special Requests: ADEQUATE
Special Requests: ADEQUATE
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
## 2021-08-07 NOTE — Progress Notes (Signed)
Communicated with pt's son this morning via text. Son agreeable to navigator submitting pt's referral to [facility] to see which clinics in the [place] area would be an option. Several of the local clinics are at capacity and navigator needs to explore which clinics would have availability. Per chart, pt to need a life vest at d/c. Favio need to locate a clinic that can meet that need as well. Referral submitted to [facility] admissions today. Amjad assist as needed.  ? ?Melven Sartorius ?Renal Navigator ?[phone] ?

## 2021-08-07 NOTE — Progress Notes (Signed)
Inpatient Rehab Admissions Coordinator:  ? ?HD initiated, no set schedule yet.  Renal still appears to be hopeful for recovery.  I Romar f/u on Monday.   ? ?Shann Medal, PT, DPT ?Admissions Coordinator ?[phone] ?08/07/21  ?3:03 PM ? ?

## 2021-08-07 NOTE — Discharge Instructions (Signed)

## 2021-08-07 NOTE — Progress Notes (Signed)
This chaplain attempted a F/U spiritual care visit. The chaplain knocked on the door and called the Pt. name. The chaplain observed the Pt. sleeping comfortably. The chaplain Onaje attempt a re-visit. ? ?Chaplain Sallyanne Kuster ?[phone] ?

## 2021-08-07 NOTE — Progress Notes (Signed)
Dr. Erlinda Hong gave order to d/c airborne precautions.  ?
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
## 2021-08-07 NOTE — Progress Notes (Signed)
Inpatient Diabetes Program Recommendations ? ?AACE/ADA: New Consensus Statement on Inpatient Glycemic Control (2015) ? ?Target Ranges:  Prepandial:   less than 140 mg/dL ?     Peak postprandial:   less than 180 mg/dL (1-2 hours) ?     Critically ill patients:  140 - 180 mg/dL  ? ?Lab Results  ?Component Value Date  ? GLUCAP 330 (H) 08/07/2021  ? HGBA1C 7.2 (H) 07/29/2021  ? ? ?Review of Glycemic Control ? Latest Reference Range & Units 08/06/21 08:45 08/06/21 12:44 08/06/21 17:44 08/06/21 21:13 08/07/21 07:27  ?Glucose-Capillary 70 - 99 mg/dL 174 (H) 239 (H) 218 (H) 242 (H) 330 (H)  ? ?Diabetes history: DM 2 ?Outpatient Diabetes medications: None ?Current orders for Inpatient glycemic control:  ?Novolog 0-15 units tid ? ?A1c 7.2% ?Nepro bid between meals ?No longer on steroids ? ?Inpatient Diabetes Program Recommendations:   ? ?-   Consider Novolog 2 units tid meal coverage if eating >50% of meals ? ?Thanks, ? ?Tama Headings RN, MSN, BC-ADM ?Inpatient Diabetes Coordinator ?Team Pager [phone] (8a-5p) ? ?
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
## 2021-08-07 NOTE — Progress Notes (Signed)
?[facility] ?Progress Note  ? ? ?Assessment/ Plan:   ? [name]. is a/an 71 y.o. male with a past medical history HFrEF, NICM, DM2, CKD (BL 2.7-3), HTN, and Afib who present after cardiac arrest ?  ?AKI on CKD 4: Likely secondary to ATN w/ cardiac arrest. Confusion/mental status largely driven by anoxic brain injury but some uremia is possible--Brentin see if there is any further improvement with HD. 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 HD #1 3/22 and #2 on 3/23. Bunny order tentative HD for 3/25 but f/u RFP from today and tomorrow but proceeding with HD. I think he Justinian continue to require dialysis but he does have a small chance for recovery ?-Continue to monitor daily Cr, Dose meds for GFR ?-Monitor Daily I/Os, Daily weight  ?-Maintain MAP>65 for optimal renal perfusion.  ?-Avoid nephrotoxic medications including NSAIDs ?-Use synthetic opioids (Fentanyl/Dilaudid) if needed ?-Renal US w/o obstruction ?  ?Hyponatremia: Resolved with HD ?  ?Anion gap Metabolic acidosis: resolved with hd ?  ?Cardiac arrest: LifeVest planned. Primary managing ?  ?Anoxic Brain injury: supportive care. GOC w/ family ?  ?NSVT: cardiology following. Amiodarone ?  ?CHF: likely some volume overload. Lasix 149m x2 doses 3/20 ?  ?Acute hypoxic respiratory failure: Likely multifactorial with COVID and some volume overload contributing.  UF as tolerated ?  ?MSSA bacteremia: Antibiotics per primary team and ID.  on ancef ?  ?COVID-19: Continue management per primary team. S/p treatment. Off precautions today ?  ?Anemia: Management per primary-transfuse prn for hgb <7/  ?  ?Uncontrolled type 2 diabetes with hyperglycemia: Management per primary ?  ? ?Subjective:   ?Some chest pain but otherwise no complaints. Tolerated dialysis yesterday. 600 cc of UOP.  ? ?Objective:   ?BP 121/80 (BP Location: Left Arm)    Pulse 68   Temp 98.2 ?F (36.8 ?C) (Oral)   Resp (!) 21   Ht '5\' 9"'  (1.753 m)   Wt 78.4 kg   SpO2 93%   BMI 25.52 kg/m?  ? ?Intake/Output Summary (Last 24 hours) at 08/07/2021 0719 ?Last data filed at 08/07/2021 0400 ?Gross per 24 hour  ?Intake 830 ml  ?Output 1600 ml  ?Net -770 ml  ? ?Weight change: 0 kg ? ?Physical Exam: ?GVZD:GLOVFIEPPIRill appearing, NAD ?CJJO:ACZYSArate, no rub ?Resp:normal wob, bilateral chest rise ?AYTK:ZSWF nt/nd ?EUXN:ATFTDpitting edema b/l LE's ?Neuro: awake, globally weak, following commands/interactive ? ?Imaging: ?IR Fluoro Guide CV Line Right ? ?Result Date: 08/05/2021 ?INDICATION: 71year old male in need of hemodialysis. He presents for tunneled HD catheter placement. EXAM: TUNNELED CENTRAL VENOUS HEMODIALYSIS CATHETER PLACEMENT WITH ULTRASOUND AND FLUOROSCOPIC GUIDANCE MEDICATIONS: 2 g Ancef. The antibiotic was given in an appropriate time interval prior to skin puncture. ANESTHESIA/SEDATION: 1 mg Versed administered for anxiolysis. FLUOROSCOPY TIME:  4 mGy reference air kerma COMPLICATIONS: None immediate. PROCEDURE: Informed written consent was obtained from the patient after a discussion of the risks, benefits, and alternatives to treatment. Questions regarding the procedure were encouraged and answered. The right neck and chest were prepped with chlorhexidine in a sterile fashion, and a sterile drape was applied covering the operative field. Maximum barrier sterile technique with sterile gowns and gloves were used for the procedure. A timeout was performed prior to the initiation of the procedure. After creating a small venotomy incision, a micropuncture kit was utilized to access the right internal  jugular vein under direct, real-time ultrasound guidance after the overlying soft tissues were anesthetized with 1% lidocaine with epinephrine. Ultrasound image documentation was performed. The microwire was kinked to measure appropriate catheter length. A stiff Glidewire was  advanced to the level of the IVC and the micropuncture sheath was exchanged for a peel-away sheath. A palindrome tunneled hemodialysis catheter measuring 19 cm from tip to cuff was tunneled in a retrograde fashion from the anterior chest wall to the venotomy incision. The catheter was then placed through the peel-away sheath with tips ultimately positioned within the superior aspect of the right atrium. Final catheter positioning was confirmed and documented with a spot radiographic image. The catheter aspirates and flushes normally. The catheter was flushed with appropriate volume heparin dwells. The catheter exit site was secured with a 0-Prolene retention suture. The venotomy incision was closed with an interrupted 4-0 Vicryl, Dermabond and Steri-strips. Dressings were applied. The patient tolerated the procedure well without immediate post procedural complication. IMPRESSION: Successful placement of 19 cm tip to cuff tunneled hemodialysis catheter via the right internal jugular vein with tips terminating within the superior aspect of the right atrium. The catheter is ready for immediate use. Electronically Signed   By: Jacqulynn Cadet M.D.   On: 08/05/2021 17:44  ? ?IR US Guide Vasc Access Right ? ?Result Date: 08/05/2021 ?INDICATION: 71 year old male in need of hemodialysis. He presents for tunneled HD catheter placement. EXAM: TUNNELED CENTRAL VENOUS HEMODIALYSIS CATHETER PLACEMENT WITH ULTRASOUND AND FLUOROSCOPIC GUIDANCE MEDICATIONS: 2 g Ancef. The antibiotic was given in an appropriate time interval prior to skin puncture. ANESTHESIA/SEDATION: 1 mg Versed administered for anxiolysis. FLUOROSCOPY TIME:  4 mGy reference air kerma COMPLICATIONS: None immediate. PROCEDURE: Informed written consent was obtained from the patient after a discussion of the risks, benefits, and alternatives to treatment. Questions regarding the procedure were encouraged and answered. The right neck and chest were prepped with  chlorhexidine in a sterile fashion, and a sterile drape was applied covering the operative field. Maximum barrier sterile technique with sterile gowns and gloves were used for the procedure. A timeout was performed prior to the initiation of the procedure. After creating a small venotomy incision, a micropuncture kit was utilized to access the right internal jugular vein under direct, real-time ultrasound guidance after the overlying soft tissues were anesthetized with 1% lidocaine with epinephrine. Ultrasound image documentation was performed. The microwire was kinked to measure appropriate catheter length. A stiff Glidewire was advanced to the level of the IVC and the micropuncture sheath was exchanged for a peel-away sheath. A palindrome tunneled hemodialysis catheter measuring 19 cm from tip to cuff was tunneled in a retrograde fashion from the anterior chest wall to the venotomy incision. The catheter was then placed through the peel-away sheath with tips ultimately positioned within the superior aspect of the right atrium. Final catheter positioning was confirmed and documented with a spot radiographic image. The catheter aspirates and flushes normally. The catheter was flushed with appropriate volume heparin dwells. The catheter exit site was secured with a 0-Prolene retention suture. The venotomy incision was closed with an interrupted 4-0 Vicryl, Dermabond and Steri-strips. Dressings were applied. The patient tolerated the procedure well without immediate post procedural complication. IMPRESSION: Successful placement of 19 cm tip to cuff tunneled hemodialysis catheter via the right internal jugular vein with tips terminating within the superior aspect of the right atrium. The catheter is ready for immediate use. Electronically Signed   By: Jacqulynn Cadet M.D.   On: 08/05/2021  17:44   ? ?Labs: ?BMET ?Recent Labs  ?Lab 08/01/21 ?0358 08/02/21 ?2060 08/03/21 ?0155 08/04/21 ?0320 08/05/21 ?1561  08/06/21 ?5379  ?NA 130* 123* 122* 126* 130* 136  ?K 4.5 5.0 5.0 4.2 3.9 3.5  ?CL 95* 89* 88* 90* 91* 95*  ?CO2 19* 16* 17* 18* 22 26  ?GLUCOSE 139* 144* 109* 178* 220* 184*  ?BUN 95* 109* 133* 151* 153* 126*  ?CREATININE 6.79* 7

## 2021-08-08 DIAGNOSIS — I469 Cardiac arrest, cause unspecified: Secondary | ICD-10-CM | POA: Diagnosis not present

## 2021-08-08 LAB — RENAL FUNCTION PANEL
Albumin: 2.1 g/dL — ABNORMAL LOW (ref 3.5–5.0)
Anion gap: 12 (ref 5–15)
BUN: 109 mg/dL — ABNORMAL HIGH (ref 8–23)
CO2: 25 mmol/L (ref 22–32)
Calcium: 8.1 mg/dL — ABNORMAL LOW (ref 8.9–10.3)
Chloride: 98 mmol/L (ref 98–111)
Creatinine, Ser: 5.38 mg/dL — ABNORMAL HIGH (ref 0.61–1.24)
GFR, Estimated: 11 mL/min — ABNORMAL LOW (ref >60)
Glucose, Bld: 334 mg/dL — ABNORMAL HIGH (ref 70–99)
Phosphorus: 4.7 mg/dL — ABNORMAL HIGH (ref 2.5–4.6)
Potassium: 3.9 mmol/L (ref 3.5–5.1)
Sodium: 135 mmol/L (ref 135–145)

## 2021-08-08 LAB — CBC
HCT: 26.5 % — ABNORMAL LOW (ref 39.0–52.0)
Hemoglobin: 8.7 g/dL — ABNORMAL LOW (ref 13.0–17.0)
MCH: 27.9 pg (ref 26.0–34.0)
MCHC: 32.8 g/dL (ref 30.0–36.0)
MCV: 84.9 fL (ref 80.0–100.0)
Platelets: 257 10*3/uL (ref 150–400)
RBC: 3.12 MIL/uL — ABNORMAL LOW (ref 4.22–5.81)
RDW: 16.9 % — ABNORMAL HIGH (ref 11.5–15.5)
WBC: 17.3 10*3/uL — ABNORMAL HIGH (ref 4.0–10.5)
nRBC: 0 % (ref 0.0–0.2)

## 2021-08-08 LAB — GLUCOSE, CAPILLARY
Glucose-Capillary: 291 mg/dL — ABNORMAL HIGH (ref 70–99)
Glucose-Capillary: 320 mg/dL — ABNORMAL HIGH (ref 70–99)
Glucose-Capillary: 107 mg/dL — ABNORMAL HIGH (ref 70–99)
Glucose-Capillary: 201 mg/dL — ABNORMAL HIGH (ref 70–99)

## 2021-08-08 MED ORDER — HEPARIN SODIUM (PORCINE) 1000 UNIT/ML IJ SOLN
INTRAMUSCULAR | Status: AC
  Filled 2021-08-08: qty 4

## 2021-08-08 NOTE — Progress Notes (Signed)
?[facility] ?Progress Note  ? ? ?Assessment/ Plan:   ? Lucas Leiker. is a/an 71 y.o. male with a past medical history HFrEF, NICM, DM2, CKD (BL 2.7-3), HTN, and Afib who present after cardiac arrest ?  ?AKI on CKD 4: Likely secondary to ATN w/ cardiac arrest. Confusion/mental status largely driven by anoxic brain injury but some uremia is possible--Barkley see if there is any further improvement with HD. 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 HD #1 3/22 and #2 on 3/23. No significant improvement with labs today despite holding HD yesterday, [place] proceed with HD today. I think he Bren continue to require dialysis especially given his underlying CKD4 but he does have a small chance for recovery, Kesler ctm ?-Continue to monitor daily Cr, Dose meds for GFR ?-Monitor Daily I/Os, Daily weight  ?-Maintain MAP>65 for optimal renal perfusion.  ?-Avoid nephrotoxic medications including NSAIDs ?-Use synthetic opioids (Fentanyl/Dilaudid) if needed ?-Renal US w/o obstruction ?  ?Hyponatremia: Resolved with HD ?  ?Anion gap Metabolic acidosis: resolved with hd ?  ?Cardiac arrest: LifeVest planned. Primary managing ?  ?Anoxic Brain injury/encephalopathy: supportive care. Possible uremic encephalopathy--HD as above ?  ?NSVT: cardiology following. Amiodarone ?  ?CHF: likely some volume overload. Lasix 120mg  x2 doses 3/20 ?  ?Acute hypoxic respiratory failure: Likely multifactorial with COVID and some volume overload contributing.  UF as tolerated ?  ?MSSA bacteremia: Antibiotics per primary team and ID.  on ancef ?  ?COVID-19: Continue management per primary team. S/p treatment. Off precautions today ?  ?Anemia: Management per primary-transfuse prn for hgb <7/  ?  ?Uncontrolled type 2 diabetes with hyperglycemia: Management per primary ?  ? ?Subjective:   ?No acute evens. Uop 1.5L but Cr 5.38, BUN  109 today  ? ?Objective:   ?BP 130/73 (BP Location: Left Arm)   Pulse 75   Temp 98.2 ?F (36.8 ?C) (Axillary)   Resp 18   Ht 5\' 9"  (1.753 m)   Wt 80.6 kg   SpO2 98%   BMI 26.24 kg/m?  ? ?Intake/Output Summary (Last 24 hours) at 08/08/2021 1223 ?Last data filed at 08/08/2021 1037 ?Gross per 24 hour  ?Intake --  ?Output 1050 ml  ?Net -1050 ml  ? ?Weight change: -5.4 kg ? ?Physical Exam: ?QVZ:DGLOVFIEPPI ill appearing, NAD ?RJJ:OACZYS rate, irreg rhythm ?Resp:normal wob, bilateral chest rise ?AYT:KZSW, nt/nd ?Ext: no edema ?Neuro: awake, globally weak, following commands/interactive ? ?Imaging: ?No results found. ? ?Labs: ?BMET ?Recent Labs  ?Lab 08/02/21 ?1093 08/03/21 ?0155 08/04/21 ?0320 08/05/21 ?2355 08/06/21 ?7322 08/07/21 ?0254 08/08/21 ?0259  ?NA 123* 122* 126* 130* 136 137 135  ?K 5.0 5.0 4.2 3.9 3.5 3.8 3.9  ?CL 89* 88* 90* 91* 95* 98 98  ?CO2 16* 17* 18* 22 26 26 25   ?GLUCOSE 144* 109* 178* 220* 184* 353* 334*  ?BUN 109* 133* 151* 153* 126* 100* 109*  ?CREATININE 7.86* 8.17* 8.55* 8.01* 6.36* 5.26* 5.38*  ?CALCIUM 7.7* 7.4* 7.7* 7.9* 8.0* 8.2* 8.1*  ?PHOS  --   --   --   --   --  4.7* 4.7*  ? ?CBC ?Recent Labs  ?Lab 08/03/21 ?0155 08/04/21 ?0320 08/05/21 ?2706 08/06/21 ?2376  ?WBC 16.4* 13.6* 14.8* 15.7*  ?NEUTROABS  --   --   --  12.2*  ?HGB 8.3* 8.6* 8.3* 9.0*  ?HCT 24.3* 24.4* 24.0* 25.8*  ?MCV 79.7*  79.0* 78.4* 79.4*  ?PLT 179 183 181 200  ? ? ?Medications:   ? ? acetaminophen  1,000 mg Oral TID  ? amiodarone  200 mg Oral BID  ? apixaban  5 mg Oral BID  ? Chlorhexidine Gluconate Cloth  6 each Topical Q0600  ? feeding supplement (NEPRO CARB STEADY)  237 mL Oral BID BM  ? guaiFENesin  600 mg Oral BID  ? insulin aspart  0-15 Units Subcutaneous TID WC  ? lidocaine  3 patch Transdermal Q24H  ? mouth rinse  15 mL Mouth Rinse BID  ? multivitamin  1 tablet Oral QHS  ? ? ? ? ?[name] Candiss Norse ? ?[facility] ?08/08/2021, 12:23 PM  ? ?
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
## 2021-08-08 NOTE — Progress Notes (Signed)
removed 1054mls net fluid no complaints no complications tolerated tx well.  pre bp 123/76 post bp 103/67 pre weight 78.5kg post weight 77.0kg bed scales.  catheter ran well packed with heparin clamped and capped. ?

## 2021-08-08 NOTE — Progress Notes (Signed)
Rounded on [name] at the start of night shift.  He had no complaints or concerns.  He was resting comfortably in bed in no distress. ? ?BP 103/67   Pulse 66   Temp 98 ?F (36.7 ?C)   Resp (!) 22   Ht 5\' 9"  (1.753 m)   Wt 78 kg   SpO2 98%   BMI 25.39 kg/m?  ?General: Awake, alert, resting in bed in no distress ?Cardiac: Regularly irregular rhythm ?Respiratory: Clear to auscultation ? ?A/P ?Continues on cefazolin for MSSA bacteremia.  Overall has no complaints today.  We Xxavier continue to monitor throughout the night.  Remainder per daytime progress note. ?

## 2021-08-09 DIAGNOSIS — I469 Cardiac arrest, cause unspecified: Secondary | ICD-10-CM | POA: Diagnosis not present

## 2021-08-09 LAB — CBC
HCT: 27.1 % — ABNORMAL LOW (ref 39.0–52.0)
Hemoglobin: 8.8 g/dL — ABNORMAL LOW (ref 13.0–17.0)
MCH: 27.5 pg (ref 26.0–34.0)
MCHC: 32.5 g/dL (ref 30.0–36.0)
MCV: 84.7 fL (ref 80.0–100.0)
Platelets: 246 10*3/uL (ref 150–400)
RBC: 3.2 MIL/uL — ABNORMAL LOW (ref 4.22–5.81)
RDW: 16.7 % — ABNORMAL HIGH (ref 11.5–15.5)
WBC: 15 10*3/uL — ABNORMAL HIGH (ref 4.0–10.5)
nRBC: 0 % (ref 0.0–0.2)

## 2021-08-09 LAB — RENAL FUNCTION PANEL
Albumin: 2.1 g/dL — ABNORMAL LOW (ref 3.5–5.0)
Anion gap: 9 (ref 5–15)
BUN: 62 mg/dL — ABNORMAL HIGH (ref 8–23)
CO2: 27 mmol/L (ref 22–32)
Calcium: 8.1 mg/dL — ABNORMAL LOW (ref 8.9–10.3)
Chloride: 100 mmol/L (ref 98–111)
Creatinine, Ser: 3.86 mg/dL — ABNORMAL HIGH (ref 0.61–1.24)
GFR, Estimated: 16 mL/min — ABNORMAL LOW (ref >60)
Glucose, Bld: 225 mg/dL — ABNORMAL HIGH (ref 70–99)
Phosphorus: 3.9 mg/dL (ref 2.5–4.6)
Potassium: 4 mmol/L (ref 3.5–5.1)
Sodium: 136 mmol/L (ref 135–145)

## 2021-08-09 LAB — GLUCOSE, CAPILLARY
Glucose-Capillary: 245 mg/dL — ABNORMAL HIGH (ref 70–99)
Glucose-Capillary: 271 mg/dL — ABNORMAL HIGH (ref 70–99)
Glucose-Capillary: 368 mg/dL — ABNORMAL HIGH (ref 70–99)
Glucose-Capillary: 175 mg/dL — ABNORMAL HIGH (ref 70–99)

## 2021-08-09 MED ORDER — INSULIN GLARGINE-YFGN 100 UNIT/ML ~~LOC~~ SOLN
5.0000 [IU] | Freq: Every day | SUBCUTANEOUS | Status: DC
  Administered 2021-08-09: 5 [IU] via SUBCUTANEOUS
  Filled 2021-08-09 (×2): qty 0.05

## 2021-08-09 NOTE — Progress Notes (Signed)
FPTS Brief Note ?Reviewed patient's vitals, recent notes.  ?Vitals:  ? 08/09/21 1405 08/09/21 2019  ?BP: 126/70 (!) 132/58  ?Pulse: 82 80  ?Resp: 17 18  ?Temp: 98.1 ?F (36.7 ?C) 98.4 ?F (36.9 ?C)  ?SpO2: 95% 96%  ? ?NSVT: ?Had 9 beat run NSVT. Asymptomatic. ?- Continue cardiac monitoring ?- On Amiodarone 200mg  BID ?- RN to page if sx's or further prolonged episodes ? ?At this time, no change in plan from day progress note.  ?Orvis Brill, DO ?Page [phone] with questions about this patient.  ?  ?

## 2021-08-09 NOTE — Progress Notes (Signed)
?[facility] ?Progress Note  ? ? ?Assessment/ Plan:   ? [name]. is a/an 71 y.o. male with a past medical history HFrEF, NICM, DM2, CKD (BL 2.7-3), HTN, and Afib who present after cardiac arrest ?  ?AKI on CKD 4: Likely secondary to ATN w/ cardiac arrest. Confusion/mental status largely driven by anoxic brain injury but some uremia is possible--[name] see if there is any further improvement with HD. 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 HD #1 3/22 and #2 on 3/23. #3 on 3/25.  I think he [name] continue to require dialysis especially given his underlying CKD4 but he does have a small chance for recovery, [name] ctm. [name] hold off on HD tomorrow and see how his kidney function does over the next couple of days. If robust improvement without HD then can consider removing his [facility] and monitor thereafter ?-Continue to monitor daily Cr, Dose meds for GFR ?-Monitor Daily I/Os, Daily weight  ?-Maintain MAP>65 for optimal renal perfusion.  ?-Avoid nephrotoxic medications including NSAIDs ?-Use synthetic opioids (Fentanyl/Dilaudid) if needed ?-Renal US w/o obstruction ?  ?Hyponatremia: Resolved with HD ?  ?Anion gap Metabolic acidosis: resolved with hd ?  ?Cardiac arrest: LifeVest planned. Primary managing ?  ?Anoxic Brain injury/encephalopathy: supportive care. Possible uremic encephalopathy--HD as above ?  ?NSVT: cardiology following. Amiodarone PO ?  ?CHF: likely some volume overload. Lasix 120mg  x2 doses 3/20 ?  ?Acute hypoxic respiratory failure: Likely multifactorial with COVID and some volume overload contributing.  UF as tolerated ?  ?MSSA bacteremia: Antibiotics per primary team and ID.  on ancef x 2 weeks ?  ?COVID-19: Continue management per primary team. S/p treatment. Off precautions ?  ?Anemia: Management per primary-transfuse prn for hgb <7. Iron profile ordered ?   ?Uncontrolled type 2 diabetes with hyperglycemia: Management per primary ?  ? ?Subjective:   ?Patient seen and examined bedside. Much more interactive and in better spirits with me today. He reports that he feels better after receiving HD yesterday which he tolerated. Net uf 1L  ? ?Objective:   ?BP 114/67 (BP Location: Left Arm)   Pulse 68   Temp 98.1 ?F (36.7 ?C) (Oral)   Resp 18   Ht 5\' 9"  (1.753 m)   Wt 80.4 kg   SpO2 98%   BMI 26.18 kg/m?  ? ?Intake/Output Summary (Last 24 hours) at 08/09/2021 1114 ?Last data filed at 08/09/2021 0620 ?Gross per 24 hour  ?Intake --  ?Output 1400 ml  ?Net -1400 ml  ? ?Weight change: -2.1 kg ? ?Physical Exam: ?LTJ:QZESPQZRAQT ill appearing, NAD ?MAU:QJFHLK rate, irreg rhythm ?Resp:normal wob, bilateral chest rise ?TGY:BWLS, nt/nd ?Ext: no edema ?Neuro: awake/alert, globally weak, following commands/interactive ? ?Imaging: ?No results found. ? ?Labs: ?BMET ?Recent Labs  ?Lab 08/03/21 ?0155 08/04/21 ?0320 08/05/21 ?9373 08/06/21 ?4287 08/07/21 ?6811 08/08/21 ?5726 08/09/21 ?2035  ?NA 122* 126* 130* 136 137 135 136  ?K 5.0 4.2 3.9 3.5 3.8 3.9 4.0  ?CL 88* 90* 91* 95* 98 98 100  ?CO2 17* 18* 22 26 26 25 27   ?GLUCOSE 109* 178* 220* 184* 353* 334* 225*  ?BUN 133* 151* 153* 126* 100* 109* 62*  ?CREATININE 8.17* 8.55* 8.01* 6.36* 5.26* 5.38* 3.86*  ?CALCIUM 7.4* 7.7* 7.9* 8.0* 8.2* 8.1* 8.1*  ?PHOS  --   --   --   --  4.7* 4.7* 3.9  ? ?CBC ?  Recent Labs  ?Lab 08/05/21 ?5809 08/06/21 ?0727 08/08/21 ?1144 08/09/21 ?0336  ?WBC 14.8* 15.7* 17.3* 15.0*  ?NEUTROABS  --  12.2*  --   --   ?HGB 8.3* 9.0* 8.7* 8.8*  ?HCT 24.0* 25.8* 26.5* 27.1*  ?MCV 78.4* 79.4* 84.9 84.7  ?PLT 181 200 257 246  ? ? ?Medications:   ? ? acetaminophen  1,000 mg Oral TID  ? amiodarone  200 mg Oral BID  ? apixaban  5 mg Oral BID  ? Chlorhexidine Gluconate Cloth  6 each Topical Q0600  ? feeding supplement (NEPRO CARB STEADY)  237 mL Oral BID BM  ? guaiFENesin  600 mg Oral BID  ? insulin aspart  0-15 Units  Subcutaneous TID WC  ? lidocaine  3 patch Transdermal Q24H  ? mouth rinse  15 mL Mouth Rinse BID  ? multivitamin  1 tablet Oral QHS  ? ? ? ? ?[name] ? ?[facility] ?08/09/2021, 11:14 AM  ? ?
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
## 2021-08-09 NOTE — Progress Notes (Signed)
Pt had a 9 beat run and 10 beat run NSVT.  VSS, pt reports an increase of chest spasms today described as "pops" in mid sternal area,  States worsens with movement or deep breaths and muscle cream and hot packs help for a short period,  FPTS paged via amion to update. ?

## 2021-08-09 NOTE — Progress Notes (Signed)
MD did return call after page sent, Zlatan review patient chart and Solmon advise if new orders needed.  No new orders at time of call.  Patient currently resting without c/o. ?

## 2021-08-09 NOTE — Plan of Care (Signed)
  Problem: Clinical Measurements: Goal: Diagnostic test results Saeed improve Outcome: Progressing Goal: Respiratory complications Jaydence improve Outcome: Progressing Goal: Cardiovascular complication Roberts be avoided Outcome: Progressing   

## 2021-08-10 ENCOUNTER — Inpatient Hospital Stay (HOSPITAL_COMMUNITY): Payer: Medicare PPO

## 2021-08-10 DIAGNOSIS — E440 Moderate protein-calorie malnutrition: Secondary | ICD-10-CM | POA: Diagnosis not present

## 2021-08-10 DIAGNOSIS — Z8616 Personal history of COVID-19: Secondary | ICD-10-CM

## 2021-08-10 DIAGNOSIS — N186 End stage renal disease: Secondary | ICD-10-CM

## 2021-08-10 DIAGNOSIS — I469 Cardiac arrest, cause unspecified: Secondary | ICD-10-CM | POA: Diagnosis not present

## 2021-08-10 DIAGNOSIS — N179 Acute kidney failure, unspecified: Secondary | ICD-10-CM

## 2021-08-10 DIAGNOSIS — N184 Chronic kidney disease, stage 4 (severe): Secondary | ICD-10-CM | POA: Diagnosis not present

## 2021-08-10 DIAGNOSIS — N185 Chronic kidney disease, stage 5: Secondary | ICD-10-CM

## 2021-08-10 DIAGNOSIS — R7881 Bacteremia: Secondary | ICD-10-CM | POA: Diagnosis not present

## 2021-08-10 DIAGNOSIS — B9561 Methicillin susceptible Staphylococcus aureus infection as the cause of diseases classified elsewhere: Secondary | ICD-10-CM | POA: Diagnosis not present

## 2021-08-10 LAB — RENAL FUNCTION PANEL
Albumin: 2 g/dL — ABNORMAL LOW (ref 3.5–5.0)
Albumin: 2.2 g/dL — ABNORMAL LOW (ref 3.5–5.0)
Anion gap: 9 (ref 5–15)
Anion gap: 13 (ref 5–15)
BUN: 90 mg/dL — ABNORMAL HIGH (ref 8–23)
BUN: 105 mg/dL — ABNORMAL HIGH (ref 8–23)
CO2: 23 mmol/L (ref 22–32)
CO2: 24 mmol/L (ref 22–32)
Calcium: 7.7 mg/dL — ABNORMAL LOW (ref 8.9–10.3)
Calcium: 8.2 mg/dL — ABNORMAL LOW (ref 8.9–10.3)
Chloride: 101 mmol/L (ref 98–111)
Chloride: 100 mmol/L (ref 98–111)
Creatinine, Ser: 4.78 mg/dL — ABNORMAL HIGH (ref 0.61–1.24)
Creatinine, Ser: 5.33 mg/dL — ABNORMAL HIGH (ref 0.61–1.24)
GFR, Estimated: 12 mL/min — ABNORMAL LOW (ref >60)
GFR, Estimated: 11 mL/min — ABNORMAL LOW (ref >60)
Glucose, Bld: 343 mg/dL — ABNORMAL HIGH (ref 70–99)
Glucose, Bld: 223 mg/dL — ABNORMAL HIGH (ref 70–99)
Phosphorus: 4.5 mg/dL (ref 2.5–4.6)
Phosphorus: 3.8 mg/dL (ref 2.5–4.6)
Potassium: 4 mmol/L (ref 3.5–5.1)
Potassium: 3.9 mmol/L (ref 3.5–5.1)
Sodium: 133 mmol/L — ABNORMAL LOW (ref 135–145)
Sodium: 137 mmol/L (ref 135–145)

## 2021-08-10 LAB — CBC
HCT: 23.8 % — ABNORMAL LOW (ref 39.0–52.0)
HCT: 26.4 % — ABNORMAL LOW (ref 39.0–52.0)
Hemoglobin: 7.5 g/dL — ABNORMAL LOW (ref 13.0–17.0)
Hemoglobin: 8.4 g/dL — ABNORMAL LOW (ref 13.0–17.0)
MCH: 27.3 pg (ref 26.0–34.0)
MCH: 27.6 pg (ref 26.0–34.0)
MCHC: 31.5 g/dL (ref 30.0–36.0)
MCHC: 31.8 g/dL (ref 30.0–36.0)
MCV: 86.5 fL (ref 80.0–100.0)
MCV: 86.8 fL (ref 80.0–100.0)
Platelets: 242 10*3/uL (ref 150–400)
Platelets: 260 10*3/uL (ref 150–400)
RBC: 2.75 MIL/uL — ABNORMAL LOW (ref 4.22–5.81)
RBC: 3.04 MIL/uL — ABNORMAL LOW (ref 4.22–5.81)
RDW: 17.1 % — ABNORMAL HIGH (ref 11.5–15.5)
RDW: 17.2 % — ABNORMAL HIGH (ref 11.5–15.5)
WBC: 12.8 10*3/uL — ABNORMAL HIGH (ref 4.0–10.5)
WBC: 15.4 10*3/uL — ABNORMAL HIGH (ref 4.0–10.5)
nRBC: 0 % (ref 0.0–0.2)
nRBC: 0 % (ref 0.0–0.2)

## 2021-08-10 LAB — IRON AND TIBC
Iron: 38 ug/dL — ABNORMAL LOW (ref 45–182)
Saturation Ratios: 17 % — ABNORMAL LOW (ref 17.9–39.5)
TIBC: 221 ug/dL — ABNORMAL LOW (ref 250–450)
UIBC: 183 ug/dL

## 2021-08-10 LAB — HEMOGLOBIN AND HEMATOCRIT, BLOOD
HCT: 24.4 % — ABNORMAL LOW (ref 39.0–52.0)
Hemoglobin: 7.9 g/dL — ABNORMAL LOW (ref 13.0–17.0)

## 2021-08-10 LAB — GLUCOSE, CAPILLARY
Glucose-Capillary: 321 mg/dL — ABNORMAL HIGH (ref 70–99)
Glucose-Capillary: 387 mg/dL — ABNORMAL HIGH (ref 70–99)
Glucose-Capillary: 244 mg/dL — ABNORMAL HIGH (ref 70–99)
Glucose-Capillary: 191 mg/dL — ABNORMAL HIGH (ref 70–99)

## 2021-08-10 LAB — FERRITIN: Ferritin: 249 ng/mL (ref 24–336)

## 2021-08-10 LAB — MAGNESIUM: Magnesium: 2.3 mg/dL (ref 1.7–2.4)

## 2021-08-10 MED ORDER — CHLORHEXIDINE GLUCONATE CLOTH 2 % EX PADS
6.0000 | MEDICATED_PAD | Freq: Every day | CUTANEOUS | Status: DC
  Administered 2021-08-10 – 2021-08-14 (×4): 6 via TOPICAL

## 2021-08-10 MED ORDER — INSULIN GLARGINE-YFGN 100 UNIT/ML ~~LOC~~ SOLN
10.0000 [IU] | Freq: Every day | SUBCUTANEOUS | Status: DC
  Administered 2021-08-10: 10 [IU] via SUBCUTANEOUS
  Filled 2021-08-10 (×2): qty 0.1

## 2021-08-10 MED ORDER — DIPHENHYDRAMINE HCL 25 MG PO CAPS
25.0000 mg | ORAL_CAPSULE | Freq: Four times a day (QID) | ORAL | Status: DC | PRN
  Administered 2021-08-10 – 2021-08-13 (×2): 25 mg via ORAL
  Filled 2021-08-10 (×2): qty 1

## 2021-08-10 MED ORDER — SODIUM CHLORIDE 0.9 % IV SOLN
125.0000 mg | INTRAVENOUS | Status: DC
  Administered 2021-08-11 – 2021-08-13 (×2): 125 mg via INTRAVENOUS
  Filled 2021-08-10 (×4): qty 10

## 2021-08-10 MED ORDER — DARBEPOETIN ALFA 200 MCG/0.4ML IJ SOSY
200.0000 ug | PREFILLED_SYRINGE | INTRAMUSCULAR | Status: DC
  Administered 2021-08-11: 200 ug via INTRAVENOUS
  Filled 2021-08-10 (×2): qty 0.4

## 2021-08-10 NOTE — Progress Notes (Signed)
Subjective:  600 of UOP   BUN and crt rising from 62/3.86 to 90 and 4.78.  He is lying still in bed-  looking but not sure comprehending ? ?Objective ?Vital signs in last 24 hours: ?Vitals:  ? 08/09/21 1405 08/09/21 2019 08/10/21 0016 08/10/21 0448  ?BP: 126/70 (!) 132/58 114/63 114/69  ?Pulse: 82 80 70 63  ?Resp: 17 18 20 17   ?Temp: 98.1 ?F (36.7 ?C) 98.4 ?F (36.9 ?C) 98.5 ?F (36.9 ?C) 97.9 ?F (36.6 ?C)  ?TempSrc: Oral Oral Axillary Axillary  ?SpO2: 95% 96% 98% 97%  ?Weight:    80.5 kg  ?Height:      ? ?Weight change: 2 kg ? ?Intake/Output Summary (Last 24 hours) at 08/10/2021 1015 ?Last data filed at 08/10/2021 0451 ?Gross per 24 hour  ?Intake 360 ml  ?Output 600 ml  ?Net -240 ml  ? ? ?Assessment/ Plan: ?Pt is a 71 y.o. yo male with DM, CM and ckd who was admitted on 07/27/2021 with cardiac arrest and acute on chronic renal failure  ?Assessment/Plan: ?1. Renal-  A on CRF and new start to HD this admit.  First HD 3/22-  then 3/23 and 3/25-  is making some urine but BUN and crt went up quite a bit and BUN 90, suspect is ESRD at this point-   Jesstin plan for HD tomorrow-  then keep on TTS schedule.  Eleanor CLIP and consult [facility]VS for perm access.  Phyllis need to touch base with family to make sure they are aware this is going to be ESRD -  can check labs only at dialysis  ?2. S/p cardiac arrest-  life vest planned ?3. Anemia-  hgb in the 7's-  Micael give ESA - iron stores low- most recent blood cultures on 3/19 are negative-  Chaka give IV iron  ?4. MSSA bact-  found on 3/17-  planning on ancef thru April 2 ?4. Secondary hyperparathyroidism-  phos OK, no binder-  Azan check PTH tomorrow with HD ?5. HTN/volume-  BP and volume controlled-  no BP meds-  minimal UF with HD required  ? ?Louis Meckel  ? ? ?Labs: ?Basic Metabolic Panel: ?Recent Labs  ?Lab 08/08/21 ?0259 08/09/21 ?0336 08/10/21 ?0252  ?NA 135 136 133*  ?K 3.9 4.0 4.0  ?CL 98 100 101  ?CO2 25 27 23   ?GLUCOSE 334* 225* 343*  ?BUN 109* 62* 90*  ?CREATININE 5.38*  3.86* 4.78*  ?CALCIUM 8.1* 8.1* 7.7*  ?PHOS 4.7* 3.9 4.5  ? ?Liver Function Tests: ?Recent Labs  ?Lab 08/06/21 ?0814 08/07/21 ?0759 08/08/21 ?0259 08/09/21 ?4818 08/10/21 ?0252  ?AST 23  --   --   --   --   ?ALT 7  --   --   --   --   ?ALKPHOS 94  --   --   --   --   ?BILITOT 0.9  --   --   --   --   ?PROT 6.4*  --   --   --   --   ?ALBUMIN 2.1*   < > 2.1* 2.1* 2.0*  ? < > = values in this interval not displayed.  ? ?No results for input(s): LIPASE, AMYLASE in the last 168 hours. ?No results for input(s): AMMONIA in the last 168 hours. ?CBC: ?Recent Labs  ?Lab 08/05/21 ?5631 08/06/21 ?4970 08/08/21 ?1144 08/09/21 ?2637 08/10/21 ?8588 08/10/21 ?5027  ?WBC 14.8* 15.7* 17.3* 15.0* 12.8*  --   ?NEUTROABS  --  12.2*  --   --   --   --   ?  HGB 8.3* 9.0* 8.7* 8.8* 7.5* 7.9*  ?HCT 24.0* 25.8* 26.5* 27.1* 23.8* 24.4*  ?MCV 78.4* 79.4* 84.9 84.7 86.5  --   ?PLT 181 200 257 246 242  --   ? ?Cardiac Enzymes: ?No results for input(s): CKTOTAL, CKMB, CKMBINDEX, TROPONINI in the last 168 hours. ?CBG: ?Recent Labs  ?Lab 08/09/21 ?0737 08/09/21 ?1128 08/09/21 ?1534 08/09/21 ?2032 08/10/21 ?0818  ?GLUCAP 245* 271* 368* 175* 321*  ? ? ?Iron Studies:  ?Recent Labs  ?  08/10/21 ?0252  ?IRON 38*  ?TIBC 221*  ?FERRITIN 249  ? ?Studies/Results: ?No results found. ?Medications: ?Infusions: ?  ceFAZolin (ANCEF) IV 1 g (08/10/21 0005)  ? ? ?Scheduled Medications: ? acetaminophen  1,000 mg Oral TID  ? amiodarone  200 mg Oral BID  ? apixaban  5 mg Oral BID  ? Chlorhexidine Gluconate Cloth  6 each Topical Q0600  ? feeding supplement (NEPRO CARB STEADY)  237 mL Oral BID BM  ? guaiFENesin  600 mg Oral BID  ? insulin aspart  0-15 Units Subcutaneous TID WC  ? insulin glargine-yfgn  5 Units Subcutaneous QHS  ? lidocaine  3 patch Transdermal Q24H  ? mouth rinse  15 mL Mouth Rinse BID  ? multivitamin  1 tablet Oral QHS  ? ? have reviewed scheduled and prn medications. ? ?Physical Exam: ?General: alert, looking and tracking-  not sure he understands-   ends conversation with "its going to be OK ?"  ?Heart: RRR ?Lungs: mostly clear ?Abdomen: soft, non tender ?Extremities: min peripheral edema ?Dialysis Access: TDC  ? ? ?08/10/2021,10:15 AM ? LOS: 14 days  ? ?  ? ? ? ? ?

## 2021-08-10 NOTE — Progress Notes (Signed)
Upper extremity vein mapping has been completed.  ? ?Preliminary results in CV Proc.  ? ?[name] ?08/10/2021 3:11 PM    ?
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
## 2021-08-10 NOTE — Progress Notes (Signed)
FPTS Brief Note ?Reviewed patient's vitals, recent notes.  ?Vitals:  ? 08/10/21 1407 08/10/21 2015  ?BP: (!) 106/56 (!) 122/59  ?Pulse: 75 75  ?Resp: 19 (!) 24  ?Temp: 98.5 ?F (36.9 ?C) 98.6 ?F (37 ?C)  ?SpO2: 98% 97%  ? ?At this time, no change in plan from day progress note.  ?Gifford Shave, MD ?Page [phone] with questions about this patient.  ?  ?
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
## 2021-08-10 NOTE — Progress Notes (Signed)
Inpatient Rehab Admissions Coordinator:  ? ?Met with patient and his son at the bedside to discuss CIR recommendations and goals/expectations of CIR stay.  We reviewed 3 hrs/day of therapy, average length of stay ~2 weeks, depending on progress, and goals of supervision at discharge.  I reviewed insurance process and that [employer] does not typically approve SNF after CIR, so 24/7 would need to be in place prior to d/c from CIR.  Pt and son verbalize understanding.  They would like to pursue CIR, but also need to discuss with family whether they can pull together caregiver support.  Tawfiq f/u with them tomorrow.  ? ?Shann Medal, PT, DPT ?Admissions Coordinator ?[phone] ?08/10/21  ?3:37 PM ? ?
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
## 2021-08-11 DIAGNOSIS — I469 Cardiac arrest, cause unspecified: Secondary | ICD-10-CM | POA: Diagnosis not present

## 2021-08-11 LAB — URINALYSIS, COMPLETE (UACMP) WITH MICROSCOPIC
Bilirubin Urine: NEGATIVE
Glucose, UA: NEGATIVE mg/dL
Ketones, ur: NEGATIVE mg/dL
Nitrite: NEGATIVE
Protein, ur: 100 mg/dL — AB
Specific Gravity, Urine: 1.018 (ref 1.005–1.030)
pH: 5 (ref 5.0–8.0)

## 2021-08-11 LAB — CBC
HCT: 23.8 % — ABNORMAL LOW (ref 39.0–52.0)
Hemoglobin: 7.7 g/dL — ABNORMAL LOW (ref 13.0–17.0)
MCH: 27.9 pg (ref 26.0–34.0)
MCHC: 32.4 g/dL (ref 30.0–36.0)
MCV: 86.2 fL (ref 80.0–100.0)
Platelets: 240 10*3/uL (ref 150–400)
RBC: 2.76 MIL/uL — ABNORMAL LOW (ref 4.22–5.81)
RDW: 17.1 % — ABNORMAL HIGH (ref 11.5–15.5)
WBC: 13.5 10*3/uL — ABNORMAL HIGH (ref 4.0–10.5)
nRBC: 0 % (ref 0.0–0.2)

## 2021-08-11 LAB — RENAL FUNCTION PANEL
Albumin: 2 g/dL — ABNORMAL LOW (ref 3.5–5.0)
Anion gap: 12 (ref 5–15)
BUN: 115 mg/dL — ABNORMAL HIGH (ref 8–23)
CO2: 24 mmol/L (ref 22–32)
Calcium: 8 mg/dL — ABNORMAL LOW (ref 8.9–10.3)
Chloride: 98 mmol/L (ref 98–111)
Creatinine, Ser: 5.66 mg/dL — ABNORMAL HIGH (ref 0.61–1.24)
GFR, Estimated: 10 mL/min — ABNORMAL LOW (ref >60)
Glucose, Bld: 294 mg/dL — ABNORMAL HIGH (ref 70–99)
Phosphorus: 4.6 mg/dL (ref 2.5–4.6)
Potassium: 4 mmol/L (ref 3.5–5.1)
Sodium: 134 mmol/L — ABNORMAL LOW (ref 135–145)

## 2021-08-11 LAB — LIPID PANEL
Cholesterol: 71 mg/dL (ref 0–200)
HDL: 33 mg/dL — ABNORMAL LOW (ref >40)
LDL Cholesterol: 31 mg/dL (ref 0–99)
Total CHOL/HDL Ratio: 2.2 RATIO
Triglycerides: 35 mg/dL (ref <150)
VLDL: 7 mg/dL (ref 0–40)

## 2021-08-11 LAB — GLUCOSE, CAPILLARY
Glucose-Capillary: 147 mg/dL — ABNORMAL HIGH (ref 70–99)
Glucose-Capillary: 329 mg/dL — ABNORMAL HIGH (ref 70–99)
Glucose-Capillary: 251 mg/dL — ABNORMAL HIGH (ref 70–99)

## 2021-08-11 LAB — PARATHYROID HORMONE, INTACT (NO CA): PTH: 100 pg/mL — ABNORMAL HIGH (ref 15–65)

## 2021-08-11 MED ORDER — INSULIN GLARGINE-YFGN 100 UNIT/ML ~~LOC~~ SOLN
15.0000 [IU] | Freq: Every day | SUBCUTANEOUS | Status: DC
  Administered 2021-08-11 – 2021-08-13 (×3): 15 [IU] via SUBCUTANEOUS
  Filled 2021-08-11 (×4): qty 0.15

## 2021-08-11 MED ORDER — HEPARIN SODIUM (PORCINE) 1000 UNIT/ML IJ SOLN
INTRAMUSCULAR | Status: AC
  Filled 2021-08-11: qty 4
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
## 2021-08-11 NOTE — Progress Notes (Signed)
Noted CoachingBuilder.tn of pain when he has to void.Bladder scan done & obtained 347 cc macular degeneration on call was made aware & ordered urinalysis. ?

## 2021-08-11 NOTE — Progress Notes (Signed)
PT Cancellation Note ? ?Patient Details ?Name: [name]. ?MRN: [id number] ?DOB: Sep 12, 1950 ? ? ?Cancelled Treatment:    Reason Eval/Treat Not Completed: Patient at procedure or test/unavailable. Patient is off unit for HD. [name] re-attempt later as time allows.  ? ? ?[name] ?08/11/2021, 9:01 AM ? ? ?
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
## 2021-08-11 NOTE — Procedures (Signed)
Patient was seen on dialysis and the procedure was supervised.  BFR 400  Via TDC BP is  119/70. ? ? Patient appears to be tolerating treatment well ? ?[name] ?08/11/2021 ? ?

## 2021-08-11 NOTE — Progress Notes (Signed)
Spoke with son of [name]. He is aware that HD is a long term schedule and plans to help his father keep up with this. He does however admit he is the only person in the family that drives. He [name] try to figure out transportation for his father but [name] also informed him there may be a service that helps him get to dialysis. [name] [name] check with the primary team and outpatient clinic on these things. He states he has been on his father about taking his medications and he reiterated this several weeks prior to his hospitalization. He plans to help when he is discharged. He also states his mother is somewhat involved even though they are divorced they talk from time to time. [name] [name] plan to call her tomorrow.  ? ?Gerlene Fee, DO ?08/11/2021, 1:46 PM ?PGY-3, [place] ? ?

## 2021-08-11 NOTE — Progress Notes (Signed)
?  Progress Note ? ? ? ?08/11/2021 ?2:31 PM ?* No surgery found * ? ?Subjective: No acute complaints ? ?Vitals:  ? 08/11/21 1112 08/11/21 1146  ?BP: (!) 142/82 136/90  ?Pulse: 75 74  ?Resp: 20 (!) 23  ?Temp: 98 ?F (36.7 ?C) 97.7 ?F (36.5 ?C)  ?SpO2: 100% 98%  ? ? ?Physical Exam: ?Awake alert oriented ?Palpable radial pulses bilaterally ?No IVs left arm ? ?CBC ?   ?Component Value Date/Time  ? WBC 13.5 (H) 08/11/2021 0320  ? RBC 2.76 (L) 08/11/2021 0320  ? HGB 7.7 (L) 08/11/2021 0320  ? HCT 23.8 (L) 08/11/2021 0320  ? PLT 240 08/11/2021 0320  ? MCV 86.2 08/11/2021 0320  ? MCH 27.9 08/11/2021 0320  ? MCHC 32.4 08/11/2021 0320  ? RDW 17.1 (H) 08/11/2021 0320  ? LYMPHSABS 0.9 08/06/2021 0727  ? MONOABS 1.9 (H) 08/06/2021 0727  ? EOSABS 0.0 08/06/2021 0727  ? BASOSABS 0.0 08/06/2021 0727  ? ? ?BMET ?   ?Component Value Date/Time  ? NA 134 (L) 08/11/2021 0320  ? K 4.0 08/11/2021 0320  ? CL 98 08/11/2021 0320  ? CO2 24 08/11/2021 0320  ? GLUCOSE 294 (H) 08/11/2021 0320  ? BUN 115 (H) 08/11/2021 0320  ? CREATININE 5.66 (H) 08/11/2021 0320  ? CALCIUM 8.0 (L) 08/11/2021 0320  ? GFRNONAA 10 (L) 08/11/2021 0320  ? ? ?INR ?   ?Component Value Date/Time  ? INR 1.5 (H) 07/27/2021 2205  ? ? ? ?Intake/Output Summary (Last 24 hours) at 08/11/2021 1431 ?Last data filed at 08/11/2021 1400 ?Gross per 24 hour  ?Intake 260 ml  ?Output 350 ml  ?Net -90 ml  ? ? ? ?Assessment/plan:  71 y.o. male is now with end-stage renal disease has tunneled dialysis catheter.  He is now agreeable to left arm access we Carlous plan this for Friday.  I have held Eliquis he is okay for heparin drip if necessary.  Arther be n.p.o. past midnight on Thursday. ? ? ?Bowie Doiron C. [name], MD ?[facility] ?Office: [phone] ?Pager: [phone] ? ?08/11/2021 ?2:31 PM ? ?

## 2021-08-11 NOTE — Progress Notes (Signed)
Mobility Specialist Progress Note  ? ? 08/11/21 1646  ?Mobility  ?Activity Ambulated with assistance in hallway  ?Level of Assistance Minimal assist, patient does 75% or more  ?Assistive Device Front wheel walker  ?Distance Ambulated (ft) 200 ft  ?Activity Response Tolerated well  ?$Mobility charge 1 Mobility  ? ?Pt received in bed and agreeable. Stood at sink to groom. No complaints. Pt stated he felt like he was more focused and  getting stronger. Took a few short standing rest breaks. Had BM on BSC. Left in chair with call bell in reach and alarm on.  ? ?Hildred Alamin ?Mobility Specialist  ?  ?
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
## 2021-08-11 NOTE — Progress Notes (Signed)
OT Cancellation Note ? ?Patient Details ?Name: [name]. ?MRN: [id number] ?DOB: 07-28-1950 ? ? ?Cancelled Treatment:  Reason Eval/Treat Not Completed: Pt at procedure or test/unavailable. Pt is off unit for HD, [place] reattempt as schedule permits. ? ?[name], OTD, OTR/L ?Acute Rehab ?[phone] ?    ? ?[name] ?08/11/2021, 11:23 AM ?

## 2021-08-11 NOTE — Progress Notes (Signed)
PT Cancellation Note ? ?Patient Details ?Name: [name]. ?MRN: [id number] ?DOB: 1950/09/02 ? ? ?Cancelled Treatment:    Reason Eval/Treat Not Completed: Fatigue/lethargy limiting ability to participate. Patient resting in bed, asks that I try to come back later. [place] re-attempt as able.  ? ? ?[name] ?08/11/2021, 2:33 PM ?

## 2021-08-11 NOTE — Progress Notes (Signed)
Pt.had 21 beats of Vtach in the monitor,pt.was asymptomatic B/P=123/75;HR=68;o2 sat =98 % on RA.Md on call made aware. ?

## 2021-08-11 NOTE — Progress Notes (Signed)
Inpatient Rehab Admissions Coordinator:  ? ?Met with patient and his daughter at bedside to continue discussions of CIR.  Per daughter, they feel confident they Judge be able to pull together 24/7 supervision at discharge.  We discussed that insurance [place] not typically approve SNF following a CIR admission, but they report no interest in SNF at any time.  I left Linton Rump a message to update and March start insurance auth request today.  ? ?Shann Medal, PT, DPT ?Admissions Coordinator ?[phone] ?08/11/21  ?12:30 PM ? ?

## 2021-08-11 NOTE — Progress Notes (Signed)
FPTS Brief Note ?Reviewed patient's vitals, recent notes.  ?Vitals:  ? 08/11/21 1112 08/11/21 1146  ?BP: (!) 142/82 136/90  ?Pulse: 75 74  ?Resp: 20 (!) 23  ?Temp: 98 ?F (36.7 ?C) 97.7 ?F (36.5 ?C)  ?SpO2: 100% 98%  ? ?At this time, no change in plan from day progress note.  ?Gifford Shave, MD ?Page [phone] with questions about this patient.  ?  ?

## 2021-08-11 NOTE — Progress Notes (Signed)
Pt's referral for out-pt HD is still pending with [facility]. Pt's case being reviewed by Emilie Rutter. Awaiting final approval. Pt's need for life vest at d/c requires additional consideration/clearance.  ? ?[name] ?Renal Navigator ?[phone] ?
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
## 2021-08-12 ENCOUNTER — Telehealth: Payer: Self-pay | Admitting: Family Medicine

## 2021-08-12 DIAGNOSIS — I469 Cardiac arrest, cause unspecified: Secondary | ICD-10-CM | POA: Diagnosis not present

## 2021-08-12 LAB — RENAL FUNCTION PANEL
Albumin: 2.1 g/dL — ABNORMAL LOW (ref 3.5–5.0)
Anion gap: 10 (ref 5–15)
BUN: 62 mg/dL — ABNORMAL HIGH (ref 8–23)
CO2: 28 mmol/L (ref 22–32)
Calcium: 7.9 mg/dL — ABNORMAL LOW (ref 8.9–10.3)
Chloride: 99 mmol/L (ref 98–111)
Creatinine, Ser: 4.46 mg/dL — ABNORMAL HIGH (ref 0.61–1.24)
GFR, Estimated: 13 mL/min — ABNORMAL LOW (ref >60)
Glucose, Bld: 148 mg/dL — ABNORMAL HIGH (ref 70–99)
Phosphorus: 4.1 mg/dL (ref 2.5–4.6)
Potassium: 3.8 mmol/L (ref 3.5–5.1)
Sodium: 137 mmol/L (ref 135–145)

## 2021-08-12 LAB — GLUCOSE, CAPILLARY
Glucose-Capillary: 132 mg/dL — ABNORMAL HIGH (ref 70–99)
Glucose-Capillary: 174 mg/dL — ABNORMAL HIGH (ref 70–99)
Glucose-Capillary: 213 mg/dL — ABNORMAL HIGH (ref 70–99)
Glucose-Capillary: 115 mg/dL — ABNORMAL HIGH (ref 70–99)
Glucose-Capillary: 284 mg/dL — ABNORMAL HIGH (ref 70–99)
Glucose-Capillary: 314 mg/dL — ABNORMAL HIGH (ref 70–99)

## 2021-08-12 LAB — PARATHYROID HORMONE, INTACT (NO CA): PTH: 147 pg/mL — ABNORMAL HIGH (ref 15–65)

## 2021-08-12 MED ORDER — INSULIN ASPART 100 UNIT/ML IJ SOLN
11.0000 [IU] | Freq: Once | INTRAMUSCULAR | Status: AC
  Administered 2021-08-12: 11 [IU] via SUBCUTANEOUS

## 2021-08-12 MED ORDER — SODIUM CHLORIDE 0.9 % IV SOLN: INTRAVENOUS | Status: DC

## 2021-08-12 MED ORDER — CHLORHEXIDINE GLUCONATE CLOTH 2 % EX PADS: 6.0000 | MEDICATED_PAD | Freq: Every day | CUTANEOUS | Status: DC

## 2021-08-12 NOTE — Progress Notes (Signed)
Pt has been accepted at [facility] on TTS schedule with 10:20 chair time. Pt's start date Marvon be determined based on pt's d/c plan. CIR is currently being considered per chart review. Message left for pt's son, Linton Rump, requesting a return call to discuss arrangements. Update provided to nephrologist and [facility] staff. Ladislav assist as needed. ? ?Melven Sartorius ?Renal Navigator ?[phone] ?

## 2021-08-12 NOTE — Progress Notes (Signed)
FPTS Brief Note ?Reviewed patient's vitals, recent notes.  ?Vitals:  ? 08/12/21 1500 08/12/21 2000  ?BP: 131/66 (!) 121/56  ?Pulse: 77 77  ?Resp:  16  ?Temp:  (!) 97.4 ?F (36.3 ?C)  ?SpO2: 97% 99%  ? ?At this time, no change in plan from day progress note.  ?Gifford Shave, MD ?Page [phone] with questions about this patient.  ?  ?

## 2021-08-12 NOTE — Telephone Encounter (Signed)
Called ex-wife per patient request with update. She is concerned about transportation to outpatient dialysis. I am not sure how this is set up but [name] touch base with SW tomorrow to inquire. She was appreciative of the call.  ? ?Gerlene Fee, DO ?08/12/2021, 5:06 PM ?PGY-3, [place] ? ? ?

## 2021-08-12 NOTE — Progress Notes (Signed)
Mobility Specialist Progress Note  ? ? 08/12/21 1035  ?Mobility  ?Activity Ambulated with assistance in hallway  ?Level of Assistance Minimal assist, patient does 75% or more  ?Assistive Device Front wheel walker  ?Distance Ambulated (ft) 250 ft  ?Activity Response Tolerated well  ?$Mobility charge 1 Mobility  ? ?Pre-Mobility: 79 HR, 98% SpO2 ?During Mobility: 96 HR ? ?Pt received in chair and agreeable. MinA to stand and a steadying assist needed for ambulation. No complaints. X1 LOB when turning to sit on BSC. Left on BSC with NT present for pericare.  ? ? ?Hildred Alamin ?Mobility Specialist  ?  ?
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
## 2021-08-13 LAB — BASIC METABOLIC PANEL
Anion gap: 12 (ref 5–15)
Anion gap: 10 (ref 5–15)
BUN: 83 mg/dL — ABNORMAL HIGH (ref 8–23)
BUN: 30 mg/dL — ABNORMAL HIGH (ref 8–23)
CO2: 27 mmol/L (ref 22–32)
CO2: 27 mmol/L (ref 22–32)
Calcium: 7.9 mg/dL — ABNORMAL LOW (ref 8.9–10.3)
Calcium: 7.8 mg/dL — ABNORMAL LOW (ref 8.9–10.3)
Chloride: 99 mmol/L (ref 98–111)
Chloride: 98 mmol/L (ref 98–111)
Creatinine, Ser: 5.33 mg/dL — ABNORMAL HIGH (ref 0.61–1.24)
Creatinine, Ser: 3.09 mg/dL — ABNORMAL HIGH (ref 0.61–1.24)
GFR, Estimated: 11 mL/min — ABNORMAL LOW (ref >60)
GFR, Estimated: 21 mL/min — ABNORMAL LOW (ref >60)
Glucose, Bld: 65 mg/dL — ABNORMAL LOW (ref 70–99)
Glucose, Bld: 269 mg/dL — ABNORMAL HIGH (ref 70–99)
Potassium: 3.8 mmol/L (ref 3.5–5.1)
Potassium: 4.2 mmol/L (ref 3.5–5.1)
Sodium: 138 mmol/L (ref 135–145)
Sodium: 135 mmol/L (ref 135–145)

## 2021-08-13 LAB — GLUCOSE, CAPILLARY
Glucose-Capillary: 72 mg/dL (ref 70–99)
Glucose-Capillary: 86 mg/dL (ref 70–99)
Glucose-Capillary: 207 mg/dL — ABNORMAL HIGH (ref 70–99)
Glucose-Capillary: 278 mg/dL — ABNORMAL HIGH (ref 70–99)

## 2021-08-13 LAB — MAGNESIUM: Magnesium: 2 mg/dL (ref 1.7–2.4)

## 2021-08-13 MED ORDER — HEPARIN SODIUM (PORCINE) 1000 UNIT/ML IJ SOLN
INTRAMUSCULAR | Status: AC
Start: 2021-08-13 — End: 2021-08-14
  Filled 2021-08-13: qty 4

## 2021-08-13 MED ORDER — INSULIN ASPART 100 UNIT/ML IJ SOLN
0.0000 [IU] | Freq: Three times a day (TID) | INTRAMUSCULAR | Status: DC
  Administered 2021-08-13: 2 [IU] via SUBCUTANEOUS

## 2021-08-13 NOTE — Progress Notes (Signed)
PT Cancellation Note ? ?Patient Details ?Name: [name]. ?MRN: [id number] ?DOB: 1950-08-27 ? ? ?Cancelled Treatment:    Reason Eval/Treat Not Completed: Patient at procedure or test/unavailable (HD) ? ? ?Rudie Rikard B Climmie Cronce ?08/13/2021, 10:24 AM ?Cathe Bilger P, PT ?Acute Rehabilitation Services ?Pager: [phone] ?Office: [phone] ? ? ? ?
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
## 2021-08-13 NOTE — Progress Notes (Signed)
Inpatient Rehab Admissions Coordinator:  ? ?I received authorization from [facility] for CIR.  [place] follow for timing of admission pending medical readiness and bed availability.   ? ?Shann Medal, PT, DPT ?Admissions Coordinator ?[phone] ?08/13/21  ?1:28 PM ? ?

## 2021-08-13 NOTE — Progress Notes (Signed)
OT Cancellation Note ? ?Patient Details ?Name: [name]. ?MRN: [id number] ?DOB: 02/23/1951 ? ? ?Cancelled Treatment:    Reason Eval/Treat Not Completed: Patient at procedure or test/ unavailable HD. OT Jaquae continue to follow acutely as schedule allows ? ?[name] ?08/13/2021, 11:09 AM ? ?Jesse Sans OTR/L ?Acute Rehabilitation Services ?Pager: [phone] ?Office: [phone] ? ?

## 2021-08-13 NOTE — Progress Notes (Signed)
[name] [name] left FMLA request to be completed by pt's attending physician. FMLA documentation packet left with day shift RN, Naa Dorman. ?

## 2021-08-13 NOTE — Procedures (Signed)
Patient was seen on dialysis and the procedure was supervised.  BFR 300  Via TDC BP is  117/59. ? ? Patient appears to be tolerating treatment well-  is sleepy today -  did not sleep well last night  ? ?[name] ?08/13/2021 ? ?
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
## 2021-08-13 NOTE — Progress Notes (Signed)
Mobility Specialist Progress Note  ? ? 08/13/21 1711  ?Mobility  ?Activity Ambulated with assistance in hallway  ?Level of Assistance Minimal assist, patient does 75% or more  ?Assistive Device Front wheel walker  ?Distance Ambulated (ft) 140 ft  ?Activity Response Tolerated well  ?$Mobility charge 1 Mobility  ? ?Pt received in bed and agreeable. Had small BM on BSC. Able to stand for a few minutes for pericare. Took no rest breaks. Returned to chair with OT present.  ? ?[name] ?Mobility Specialist  ?  ?
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
## 2021-08-13 NOTE — Progress Notes (Signed)
?  Progress Note ? ? ? ?08/13/2021 ?9:19 AM ?* No surgery date entered * ? ?Subjective:  no overnight issues ? ?Vitals:  ? 08/12/21 2000 08/13/21 0617  ?BP: (!) 121/56 (!) 114/55  ?Pulse: 77 63  ?Resp: 16 20  ?Temp: (!) 97.4 ?F (36.3 ?C) 98.3 ?F (36.8 ?C)  ?SpO2: 99% 98%  ? ? ?Physical Exam: ?Awake and alert in hd unit ?Palpable radial pulses ? ?CBC ?   ?Component Value Date/Time  ? WBC 13.5 (H) 08/11/2021 0320  ? RBC 2.76 (L) 08/11/2021 0320  ? HGB 7.7 (L) 08/11/2021 0320  ? HCT 23.8 (L) 08/11/2021 0320  ? PLT 240 08/11/2021 0320  ? MCV 86.2 08/11/2021 0320  ? MCH 27.9 08/11/2021 0320  ? MCHC 32.4 08/11/2021 0320  ? RDW 17.1 (H) 08/11/2021 0320  ? LYMPHSABS 0.9 08/06/2021 0727  ? MONOABS 1.9 (H) 08/06/2021 0727  ? EOSABS 0.0 08/06/2021 0727  ? BASOSABS 0.0 08/06/2021 0727  ? ? ?BMET ?   ?Component Value Date/Time  ? NA 138 08/13/2021 0412  ? K 3.8 08/13/2021 0412  ? CL 99 08/13/2021 0412  ? CO2 27 08/13/2021 0412  ? GLUCOSE 65 (L) 08/13/2021 0412  ? BUN 83 (H) 08/13/2021 0412  ? CREATININE 5.33 (H) 08/13/2021 0412  ? CALCIUM 7.9 (L) 08/13/2021 0412  ? GFRNONAA 11 (L) 08/13/2021 0412  ? ? ?INR ?   ?Component Value Date/Time  ? INR 1.5 (H) 07/27/2021 2205  ? ? ? ?Intake/Output Summary (Last 24 hours) at 08/13/2021 0919 ?Last data filed at 08/13/2021 0500 ?Gross per 24 hour  ?Intake --  ?Output 300 ml  ?Net -300 ml  ? ? ? ?Assessment:  71 y.o. male is now esrd ? ?Plan: ?OR tomorrow for left arm avf vs avg ?Eliquis held, ok for heparin gtt if needed and [place] stop on way to OR ?NPO past midnight ? ? ?Jasminne Mealy C. [name], MD ?[facility] ?Office: [phone] ?Pager: [phone] ? ?08/13/2021 ?9:19 AM ? ?

## 2021-08-13 NOTE — Procedures (Signed)
Pt arrived in stable condition. Pre BP 117/59, Pt had no complaints. Permcath dressing changed using sterile technique, both ports draw and flush easily. Post BP 124/68 net UF is 988 mls ?

## 2021-08-14 ENCOUNTER — Other Ambulatory Visit: Payer: Self-pay

## 2021-08-14 ENCOUNTER — Encounter (HOSPITAL_COMMUNITY): Payer: Self-pay | Admitting: Anesthesiology

## 2021-08-14 ENCOUNTER — Encounter (HOSPITAL_COMMUNITY): Payer: Self-pay | Admitting: Internal Medicine

## 2021-08-14 ENCOUNTER — Encounter (HOSPITAL_COMMUNITY): Payer: Self-pay | Admitting: Physical Medicine & Rehabilitation

## 2021-08-14 ENCOUNTER — Inpatient Hospital Stay (HOSPITAL_COMMUNITY)
Admission: RE | Admit: 2021-08-14 | Discharge: 2021-08-23 | DRG: 981 | Disposition: A | Discharge status: Home / Self Care | Payer: Medicare PPO | Source: Intra-hospital | Attending: Physical Medicine & Rehabilitation | Admitting: Physical Medicine & Rehabilitation

## 2021-08-14 ENCOUNTER — Encounter: Payer: Self-pay | Admitting: Family Medicine

## 2021-08-14 DIAGNOSIS — D631 Anemia in chronic kidney disease: Secondary | ICD-10-CM | POA: Diagnosis not present

## 2021-08-14 DIAGNOSIS — I482 Chronic atrial fibrillation, unspecified: Secondary | ICD-10-CM | POA: Diagnosis present

## 2021-08-14 DIAGNOSIS — I130 Hypertensive heart and chronic kidney disease with heart failure and stage 1 through stage 4 chronic kidney disease, or unspecified chronic kidney disease: Secondary | ICD-10-CM | POA: Diagnosis not present

## 2021-08-14 DIAGNOSIS — Z91199 Patient's noncompliance with other medical treatment and regimen due to unspecified reason: Secondary | ICD-10-CM

## 2021-08-14 DIAGNOSIS — S2220XD Unspecified fracture of sternum, subsequent encounter for fracture with routine healing: Secondary | ICD-10-CM

## 2021-08-14 DIAGNOSIS — Z8616 Personal history of COVID-19: Secondary | ICD-10-CM

## 2021-08-14 DIAGNOSIS — N2581 Secondary hyperparathyroidism of renal origin: Secondary | ICD-10-CM | POA: Diagnosis present

## 2021-08-14 DIAGNOSIS — I5042 Chronic combined systolic (congestive) and diastolic (congestive) heart failure: Secondary | ICD-10-CM | POA: Diagnosis present

## 2021-08-14 DIAGNOSIS — I4891 Unspecified atrial fibrillation: Secondary | ICD-10-CM

## 2021-08-14 DIAGNOSIS — E1110 Type 2 diabetes mellitus with ketoacidosis without coma: Secondary | ICD-10-CM | POA: Diagnosis not present

## 2021-08-14 DIAGNOSIS — I7121 Aneurysm of the ascending aorta, without rupture: Secondary | ICD-10-CM | POA: Diagnosis present

## 2021-08-14 DIAGNOSIS — Z8249 Family history of ischemic heart disease and other diseases of the circulatory system: Secondary | ICD-10-CM

## 2021-08-14 DIAGNOSIS — R471 Dysarthria and anarthria: Secondary | ICD-10-CM | POA: Diagnosis present

## 2021-08-14 DIAGNOSIS — B9561 Methicillin susceptible Staphylococcus aureus infection as the cause of diseases classified elsewhere: Secondary | ICD-10-CM | POA: Diagnosis present

## 2021-08-14 DIAGNOSIS — E1122 Type 2 diabetes mellitus with diabetic chronic kidney disease: Secondary | ICD-10-CM | POA: Diagnosis not present

## 2021-08-14 DIAGNOSIS — I480 Paroxysmal atrial fibrillation: Secondary | ICD-10-CM | POA: Diagnosis present

## 2021-08-14 DIAGNOSIS — N186 End stage renal disease: Secondary | ICD-10-CM

## 2021-08-14 DIAGNOSIS — K5900 Constipation, unspecified: Secondary | ICD-10-CM | POA: Diagnosis not present

## 2021-08-14 DIAGNOSIS — S2249XA Multiple fractures of ribs, unspecified side, initial encounter for closed fracture: Secondary | ICD-10-CM | POA: Diagnosis present

## 2021-08-14 DIAGNOSIS — Z833 Family history of diabetes mellitus: Secondary | ICD-10-CM

## 2021-08-14 DIAGNOSIS — R4189 Other symptoms and signs involving cognitive functions and awareness: Secondary | ICD-10-CM | POA: Diagnosis present

## 2021-08-14 DIAGNOSIS — J189 Pneumonia, unspecified organism: Secondary | ICD-10-CM | POA: Diagnosis present

## 2021-08-14 DIAGNOSIS — G931 Anoxic brain damage, not elsewhere classified: Principal | ICD-10-CM | POA: Diagnosis present

## 2021-08-14 DIAGNOSIS — R1310 Dysphagia, unspecified: Secondary | ICD-10-CM | POA: Diagnosis present

## 2021-08-14 DIAGNOSIS — J9601 Acute respiratory failure with hypoxia: Secondary | ICD-10-CM | POA: Diagnosis not present

## 2021-08-14 DIAGNOSIS — N179 Acute kidney failure, unspecified: Secondary | ICD-10-CM | POA: Diagnosis not present

## 2021-08-14 DIAGNOSIS — Z992 Dependence on renal dialysis: Secondary | ICD-10-CM

## 2021-08-14 DIAGNOSIS — R1312 Dysphagia, oropharyngeal phase: Secondary | ICD-10-CM | POA: Diagnosis present

## 2021-08-14 DIAGNOSIS — N184 Chronic kidney disease, stage 4 (severe): Secondary | ICD-10-CM | POA: Diagnosis not present

## 2021-08-14 DIAGNOSIS — I120 Hypertensive chronic kidney disease with stage 5 chronic kidney disease or end stage renal disease: Secondary | ICD-10-CM | POA: Diagnosis not present

## 2021-08-14 DIAGNOSIS — I70208 Unspecified atherosclerosis of native arteries of extremities, other extremity: Secondary | ICD-10-CM | POA: Diagnosis present

## 2021-08-14 DIAGNOSIS — I428 Other cardiomyopathies: Secondary | ICD-10-CM | POA: Diagnosis present

## 2021-08-14 DIAGNOSIS — R946 Abnormal results of thyroid function studies: Secondary | ICD-10-CM | POA: Diagnosis present

## 2021-08-14 DIAGNOSIS — S2243XD Multiple fractures of ribs, bilateral, subsequent encounter for fracture with routine healing: Secondary | ICD-10-CM

## 2021-08-14 DIAGNOSIS — Z8674 Personal history of sudden cardiac arrest: Secondary | ICD-10-CM | POA: Diagnosis not present

## 2021-08-14 DIAGNOSIS — Z888 Allergy status to other drugs, medicaments and biological substances status: Secondary | ICD-10-CM

## 2021-08-14 DIAGNOSIS — E1151 Type 2 diabetes mellitus with diabetic peripheral angiopathy without gangrene: Secondary | ICD-10-CM | POA: Diagnosis present

## 2021-08-14 DIAGNOSIS — D649 Anemia, unspecified: Secondary | ICD-10-CM | POA: Diagnosis present

## 2021-08-14 DIAGNOSIS — I5020 Unspecified systolic (congestive) heart failure: Secondary | ICD-10-CM | POA: Diagnosis not present

## 2021-08-14 DIAGNOSIS — N185 Chronic kidney disease, stage 5: Secondary | ICD-10-CM | POA: Diagnosis not present

## 2021-08-14 DIAGNOSIS — I472 Ventricular tachycardia, unspecified: Secondary | ICD-10-CM | POA: Diagnosis present

## 2021-08-14 DIAGNOSIS — R7881 Bacteremia: Secondary | ICD-10-CM | POA: Diagnosis present

## 2021-08-14 DIAGNOSIS — I132 Hypertensive heart and chronic kidney disease with heart failure and with stage 5 chronic kidney disease, or end stage renal disease: Secondary | ICD-10-CM | POA: Diagnosis present

## 2021-08-14 DIAGNOSIS — E119 Type 2 diabetes mellitus without complications: Secondary | ICD-10-CM

## 2021-08-14 DIAGNOSIS — E871 Hypo-osmolality and hyponatremia: Secondary | ICD-10-CM | POA: Diagnosis not present

## 2021-08-14 DIAGNOSIS — U071 COVID-19: Secondary | ICD-10-CM | POA: Diagnosis not present

## 2021-08-14 LAB — BASIC METABOLIC PANEL
Anion gap: 9 (ref 5–15)
BUN: 39 mg/dL — ABNORMAL HIGH (ref 8–23)
CO2: 26 mmol/L (ref 22–32)
Calcium: 7.6 mg/dL — ABNORMAL LOW (ref 8.9–10.3)
Chloride: 96 mmol/L — ABNORMAL LOW (ref 98–111)
Creatinine, Ser: 3.63 mg/dL — ABNORMAL HIGH (ref 0.61–1.24)
GFR, Estimated: 17 mL/min — ABNORMAL LOW (ref >60)
Glucose, Bld: 212 mg/dL — ABNORMAL HIGH (ref 70–99)
Potassium: 3.9 mmol/L (ref 3.5–5.1)
Sodium: 131 mmol/L — ABNORMAL LOW (ref 135–145)

## 2021-08-14 LAB — GLUCOSE, CAPILLARY
Glucose-Capillary: 121 mg/dL — ABNORMAL HIGH (ref 70–99)
Glucose-Capillary: 82 mg/dL (ref 70–99)
Glucose-Capillary: 75 mg/dL (ref 70–99)
Glucose-Capillary: 137 mg/dL — ABNORMAL HIGH (ref 70–99)

## 2021-08-14 LAB — SURGICAL PCR SCREEN
MRSA, PCR: NEGATIVE
Staphylococcus aureus: POSITIVE — AB

## 2021-08-14 MED ORDER — GUAIFENESIN ER 600 MG PO TB12
600.0000 mg | ORAL_TABLET | Freq: Two times a day (BID) | ORAL | Status: DC
  Administered 2021-08-14 – 2021-08-23 (×17): 600 mg via ORAL
  Filled 2021-08-14 (×18): qty 1

## 2021-08-14 MED ORDER — MUPIROCIN 2 % EX OINT
TOPICAL_OINTMENT | Freq: Two times a day (BID) | CUTANEOUS | Status: AC
  Filled 2021-08-14: qty 22

## 2021-08-14 MED ORDER — DIPHENHYDRAMINE HCL 12.5 MG/5ML PO ELIX: 12.5000 mg | ORAL_SOLUTION | Freq: Four times a day (QID) | ORAL | Status: DC | PRN

## 2021-08-14 MED ORDER — RENA-VITE PO TABS
1.0000 | ORAL_TABLET | Freq: Every day | ORAL | Status: DC
  Administered 2021-08-14 – 2021-08-23 (×9): 1 via ORAL
  Filled 2021-08-14 (×9): qty 1

## 2021-08-14 MED ORDER — SENNOSIDES-DOCUSATE SODIUM 8.6-50 MG PO TABS: 1.0000 | ORAL_TABLET | Freq: Every evening | ORAL | Status: DC | PRN

## 2021-08-14 MED ORDER — PROCHLORPERAZINE EDISYLATE 10 MG/2ML IJ SOLN: 5.0000 mg | Freq: Four times a day (QID) | INTRAMUSCULAR | Status: DC | PRN

## 2021-08-14 MED ORDER — CHLORHEXIDINE GLUCONATE CLOTH 2 % EX PADS: 6.0000 | MEDICATED_PAD | Freq: Every day | CUTANEOUS | Status: DC

## 2021-08-14 MED ORDER — MUPIROCIN 2 % EX OINT
TOPICAL_OINTMENT | Freq: Two times a day (BID) | CUTANEOUS | Status: DC
  Filled 2021-08-14: qty 22

## 2021-08-14 MED ORDER — POLYETHYLENE GLYCOL 3350 17 G PO PACK
17.0000 g | PACK | Freq: Every day | ORAL | Status: DC | PRN
  Filled 2021-08-14: qty 1

## 2021-08-14 MED ORDER — INSULIN GLARGINE-YFGN 100 UNIT/ML ~~LOC~~ SOLN: 15.0000 [IU] | Freq: Every day | SUBCUTANEOUS | 11 refills | Status: DC

## 2021-08-14 MED ORDER — DARBEPOETIN ALFA 200 MCG/0.4ML IJ SOSY
200.0000 ug | PREFILLED_SYRINGE | INTRAMUSCULAR | Status: DC
  Administered 2021-08-18: 200 ug via INTRAVENOUS
  Filled 2021-08-14 (×3): qty 0.4

## 2021-08-14 MED ORDER — ORAL CARE MOUTH RINSE
15.0000 mL | Freq: Two times a day (BID) | OROMUCOSAL | Status: DC
  Administered 2021-08-14 – 2021-08-23 (×15): 15 mL via OROMUCOSAL

## 2021-08-14 MED ORDER — INSULIN ASPART 100 UNIT/ML IJ SOLN
0.0000 [IU] | Freq: Three times a day (TID) | INTRAMUSCULAR | Status: DC
  Administered 2021-08-16 (×2): 1 [IU] via SUBCUTANEOUS
  Administered 2021-08-18: 2 [IU] via SUBCUTANEOUS
  Administered 2021-08-18: 3 [IU] via SUBCUTANEOUS
  Administered 2021-08-23: 1 [IU] via SUBCUTANEOUS

## 2021-08-14 MED ORDER — SIMETHICONE 80 MG PO CHEW
80.0000 mg | CHEWABLE_TABLET | Freq: Four times a day (QID) | ORAL | Status: DC | PRN
  Filled 2021-08-14: qty 1

## 2021-08-14 MED ORDER — AMIODARONE HCL 200 MG PO TABS: 200.0000 mg | ORAL_TABLET | Freq: Two times a day (BID) | ORAL | Status: DC

## 2021-08-14 MED ORDER — CEFAZOLIN SODIUM-DEXTROSE 1-4 GM/50ML-% IV SOLN: 1.0000 g | INTRAVENOUS | Status: DC

## 2021-08-14 MED ORDER — MUPIROCIN 2 % EX OINT: TOPICAL_OINTMENT | Freq: Two times a day (BID) | CUTANEOUS | 0 refills | Status: DC

## 2021-08-14 MED ORDER — PROCHLORPERAZINE MALEATE 5 MG PO TABS: 5.0000 mg | ORAL_TABLET | Freq: Four times a day (QID) | ORAL | Status: DC | PRN

## 2021-08-14 MED ORDER — ACETAMINOPHEN 325 MG PO TABS: 325.0000 mg | ORAL_TABLET | ORAL | Status: DC | PRN

## 2021-08-14 MED ORDER — MILK AND MOLASSES ENEMA
1.0000 | Freq: Every day | RECTAL | Status: DC | PRN
  Filled 2021-08-14: qty 240

## 2021-08-14 MED ORDER — ALUMINUM HYDROXIDE GEL 320 MG/5ML PO SUSP: 10.0000 mL | ORAL | Status: DC | PRN

## 2021-08-14 MED ORDER — ACETAMINOPHEN 325 MG PO TABS
650.0000 mg | ORAL_TABLET | Freq: Three times a day (TID) | ORAL | Status: DC
  Administered 2021-08-14 – 2021-08-23 (×22): 650 mg via ORAL
  Filled 2021-08-14 (×24): qty 2

## 2021-08-14 MED ORDER — CALCIUM CARBONATE ANTACID 1250 MG/5ML PO SUSP
500.0000 mg | Freq: Four times a day (QID) | ORAL | Status: DC | PRN
  Administered 2021-08-21: 500 mg via ORAL
  Filled 2021-08-14 (×3): qty 5

## 2021-08-14 MED ORDER — FOOD THICKENER (SIMPLYTHICK): 10.0000 | ORAL | Status: DC | PRN

## 2021-08-14 MED ORDER — ACETAMINOPHEN 325 MG PO TABS
325.0000 mg | ORAL_TABLET | ORAL | Status: DC | PRN
  Administered 2021-08-15 – 2021-08-23 (×2): 650 mg via ORAL
  Filled 2021-08-14 (×3): qty 2

## 2021-08-14 MED ORDER — LIDOCAINE 5 % EX PTCH
3.0000 | MEDICATED_PATCH | CUTANEOUS | Status: DC
  Administered 2021-08-15 – 2021-08-23 (×8): 3 via TRANSDERMAL
  Filled 2021-08-14 (×9): qty 3

## 2021-08-14 MED ORDER — GUAIFENESIN-DM 100-10 MG/5ML PO SYRP: 5.0000 mL | ORAL_SOLUTION | Freq: Four times a day (QID) | ORAL | Status: DC | PRN

## 2021-08-14 MED ORDER — TRAZODONE HCL 50 MG PO TABS: 25.0000 mg | ORAL_TABLET | Freq: Every evening | ORAL | Status: DC | PRN

## 2021-08-14 MED ORDER — DARBEPOETIN ALFA 200 MCG/0.4ML IJ SOSY: 200.0000 ug | PREFILLED_SYRINGE | INTRAMUSCULAR | Status: DC

## 2021-08-14 MED ORDER — ACETAMINOPHEN 500 MG PO TABS: 1000.0000 mg | ORAL_TABLET | Freq: Three times a day (TID) | ORAL | 0 refills | Status: DC

## 2021-08-14 MED ORDER — MILK AND MOLASSES ENEMA: 1.0000 | Freq: Every day | RECTAL | Status: DC | PRN

## 2021-08-14 MED ORDER — DIPHENHYDRAMINE HCL 25 MG PO CAPS: 25.0000 mg | ORAL_CAPSULE | Freq: Four times a day (QID) | ORAL | 0 refills | Status: DC | PRN

## 2021-08-14 MED ORDER — AMIODARONE HCL 200 MG PO TABS
200.0000 mg | ORAL_TABLET | Freq: Two times a day (BID) | ORAL | Status: DC
  Administered 2021-08-14 – 2021-08-23 (×17): 200 mg via ORAL
  Filled 2021-08-14 (×18): qty 1

## 2021-08-14 MED ORDER — INSULIN GLARGINE-YFGN 100 UNIT/ML ~~LOC~~ SOLN
15.0000 [IU] | Freq: Every day | SUBCUTANEOUS | Status: DC
  Administered 2021-08-14: 15 [IU] via SUBCUTANEOUS
  Filled 2021-08-14 (×2): qty 0.15

## 2021-08-14 MED ORDER — PROCHLORPERAZINE 25 MG RE SUPP: 12.5000 mg | Freq: Four times a day (QID) | RECTAL | Status: DC | PRN

## 2021-08-14 MED ORDER — CEFAZOLIN SODIUM-DEXTROSE 1-4 GM/50ML-% IV SOLN
1.0000 g | INTRAVENOUS | Status: AC
  Administered 2021-08-14 – 2021-08-16 (×3): 1 g via INTRAVENOUS
  Filled 2021-08-14 (×3): qty 50

## 2021-08-14 MED ORDER — HEPARIN SODIUM (PORCINE) 5000 UNIT/ML IJ SOLN
5000.0000 [IU] | Freq: Three times a day (TID) | INTRAMUSCULAR | Status: DC
  Administered 2021-08-14 – 2021-08-18 (×10): 5000 [IU] via SUBCUTANEOUS
  Filled 2021-08-14 (×10): qty 1

## 2021-08-14 MED ORDER — BISACODYL 10 MG RE SUPP: 10.0000 mg | Freq: Every day | RECTAL | Status: DC | PRN

## 2021-08-14 MED ORDER — CHLORHEXIDINE GLUCONATE CLOTH 2 % EX PADS
6.0000 | MEDICATED_PAD | Freq: Two times a day (BID) | CUTANEOUS | Status: DC
  Administered 2021-08-15 – 2021-08-23 (×16): 6 via TOPICAL

## 2021-08-14 MED ORDER — ALUMINUM HYDROXIDE GEL 320 MG/5ML PO SUSP: 10.0000 mL | Freq: Four times a day (QID) | ORAL | Status: DC | PRN

## 2021-08-14 MED ORDER — BISACODYL 10 MG RE SUPP
10.0000 mg | Freq: Every day | RECTAL | Status: DC | PRN
Start: 2021-08-14 — End: 2021-08-23
  Administered 2021-08-23: 10 mg via RECTAL
  Filled 2021-08-14 (×2): qty 1

## 2021-08-14 MED ORDER — SODIUM CHLORIDE 0.9 % IV SOLN: 125.0000 mg | INTRAVENOUS | Status: DC

## 2021-08-14 MED ORDER — RENA-VITE PO TABS: 1.0000 | ORAL_TABLET | Freq: Every day | ORAL | 0 refills | Status: DC

## 2021-08-14 MED ORDER — SODIUM CHLORIDE 0.9 % IV SOLN
125.0000 mg | INTRAVENOUS | Status: DC
  Administered 2021-08-15 – 2021-08-20 (×3): 125 mg via INTRAVENOUS
  Filled 2021-08-14 (×5): qty 10

## 2021-08-14 MED ORDER — TRAZODONE HCL 50 MG PO TABS
25.0000 mg | ORAL_TABLET | Freq: Every evening | ORAL | Status: DC | PRN
  Administered 2021-08-14 – 2021-08-23 (×5): 50 mg via ORAL
  Filled 2021-08-14 (×6): qty 1

## 2021-08-14 MED ORDER — NEPRO/CARBSTEADY PO LIQD
237.0000 mL | Freq: Two times a day (BID) | ORAL | Status: DC
  Administered 2021-08-15 – 2021-08-22 (×13): 237 mL via ORAL

## 2021-08-14 MED ORDER — MUSCLE RUB 10-15 % EX CREA
TOPICAL_CREAM | CUTANEOUS | Status: DC | PRN
  Filled 2021-08-14: qty 85

## 2021-08-14 NOTE — Progress Notes (Signed)
Inpatient Rehabilitation Admission Medication Review by a Pharmacist ? ?A complete drug regimen review was completed for this patient to identify any potential clinically significant medication issues. ? ?High Risk Drug Classes Is patient taking? Indication by Medication  ?Antipsychotic No   ?Anticoagulant Yes SQ heparin - VTE prophylaxis (Eliquis for Afib on hold for procedure on 4/3)  ?Antibiotic Yes Cefazolin for pneumonia thru 08/16/21 ?Mupirocin nasal - + MRSA PCR  ?Opioid No   ?Antiplatelet No   ?Hypoglycemics/insulin Yes SSI and Semglee - glucose control  ?Vasoactive Medication No   ?Chemotherapy No   ?Other Yes Amiodarone for afib ?Darbepoetin and Ferric gluconate- anemia  ?Acetaminophen PO and lidocaine patches - pain ?Guaifenesin - mucolytic ?MVI - supplement ?Trazodone prn - sleep ?Prn laxatives, antacid, nausea meds  ? ? ? ?Type of Medication Issue Identified Description of Issue Recommendation(s)  ?No Medication Administration End Date ? Cefazolin stop date in place for 4/2 ?Mupirocin nasal BID x 5 days, stop time in place. Begun 3/31 am.  ?Ferric gluconate stop time in place, for total of 1gm/ 8 doses. Given 3/28 and 3/30.  Stop dates in place for all 3 meds.  ?Significant med changes from prior encounter (inform family/care partners about these prior to discharge). Previously on Carvedilol, Hydralazine, Isosorbide mononitrate and Torsemide. All discontinued/held while inpatient. No current plan to resume.  ?Other ? Aluminum hydroxide 10 mg q6h prn indigestion/heartburn - avoid Aluminum-containing meds with ESRD. Changed to Calcium Carbonate 500 mg q6 prn per usual Nephrology prn orders.  ? ? ?Clinically significant medication issues were identified that warrant physician communication and completion of prescribed/recommended actions by midnight of the next day:  No ? ?Pharmacist comments:  ?  Eliquis 5 mg BID for afib has been on hold since 3/28 am. Procedure planned this am but postponed until Monday,  08/17/21.   ? SQ heparin for VTE prophylaxis ordered on admit to CIR.   ? Last CBC 3/28, Hgb 7.7.  CBC to be done in am.   ? ?Time spent performing this drug regimen review (minutes):  20 ? ?Arty Baumgartner, RPh ?08/14/2021 9:21 PM ?

## 2021-08-14 NOTE — Progress Notes (Signed)
FPTS Brief Note ?Reviewed patient's vitals, recent notes.  ?Vitals:  ? 08/13/21 2216 08/14/21 0346  ?BP: 130/60 (!) 107/56  ?Pulse: 74 (!) 59  ?Resp: 18 16  ?Temp:  99.2 ?F (37.3 ?C)  ?SpO2: 96% 99%  ? ?At this time, no change in plan from day progress note.  ?Orvis Brill, DO ?Page [phone] with questions about this patient.  ?  ?
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
## 2021-08-14 NOTE — Progress Notes (Signed)
?  ?  Patient was scheduled for left arm AV fistula today but unfortunately was fed applesauce this morning and so was rescheduled for Monday.  If he is discharged over the weekend please let me know so we can schedule him as an outpatient.  Otherwise he needs to be n.p.o. past midnight Sunday. ? ?Lenea Bywater C. [name], MD ?[facility] ?Office: [phone] ?Pager: [phone] ? ?
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
## 2021-08-14 NOTE — Progress Notes (Signed)
Called son [name] to clarify plan for AV fistula today. Patient was initially concerned and refusing surgery this morning but then was agreeable to it after being told the benefits and risks involved with the surgery. Son was aware of procedure but was not sure when it was happening which led to some confusion. Son said he would communicate plan with rest of family to clarify and still supports going ahead with AV fistula. Patient is also currently agreeable to this plan. ? ?Lucas Ravens, MD ?PGY-1 Resident ?FPTS ?

## 2021-08-14 NOTE — Progress Notes (Signed)
Patient ID: [name]., male   DOB: 05/11/1951, 71 y.o.   MRN: [id number] ? ?Pt arrived to 4w17 per bed. Assessment complete, vitals obtained. Pt oriented to rehab and policies reviewed. Daughter made aware of pt arrival. Questions answered appropriately. Pt has no questions. Pt bed in lowest position, call light in reach. Needs addressed. ?Sheela Stack, LPN  ?

## 2021-08-14 NOTE — Progress Notes (Signed)
CSW received consult for transportation resources for patient. CSW spoke with patient at bedside. CSW offered patient transportation resources. Patient accepted. All questions answered. No further questions reported at this time. ?

## 2021-08-14 NOTE — Progress Notes (Signed)
Received report from the unit. Pt had a tsp of apple sauce with meds at 0928. Dr Daiva Huge from anesthesia notified. Lynden move the procedure 6 hours from 0930.  ?

## 2021-08-14 NOTE — Care Management Important Message (Signed)
Important Message ? ?Patient Details  ?Name: [name]. ?MRN: [id number] ?Date of Birth: 05/20/1950 ? ? ?Medicare Important Message Given:  Yes ? ? ? ? ?[name] ?08/14/2021, 9:05 AM ?
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
## 2021-08-14 NOTE — Progress Notes (Signed)
Inpatient Rehab Admissions Coordinator:   ? ?I have insurance approval and a bed available for pt to admit to CIR today. Dr. Lurline Hare in agreement, pending smooth procedure today.  Elizandro let pt/family and TOC team know.  ? ?Shann Medal, PT, DPT ?Admissions Coordinator ?[phone] ?08/14/21  ?9:51 AM  ? ?
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
## 2021-08-15 DIAGNOSIS — N186 End stage renal disease: Secondary | ICD-10-CM

## 2021-08-15 LAB — CBC
HCT: 24 % — ABNORMAL LOW (ref 39.0–52.0)
Hemoglobin: 7.5 g/dL — ABNORMAL LOW (ref 13.0–17.0)
MCH: 27.6 pg (ref 26.0–34.0)
MCHC: 31.3 g/dL (ref 30.0–36.0)
MCV: 88.2 fL (ref 80.0–100.0)
Platelets: 182 10*3/uL (ref 150–400)
RBC: 2.72 MIL/uL — ABNORMAL LOW (ref 4.22–5.81)
RDW: 18.3 % — ABNORMAL HIGH (ref 11.5–15.5)
WBC: 5.3 10*3/uL (ref 4.0–10.5)
nRBC: 0 % (ref 0.0–0.2)

## 2021-08-15 LAB — RENAL FUNCTION PANEL
Albumin: 1.8 g/dL — ABNORMAL LOW (ref 3.5–5.0)
Anion gap: 9 (ref 5–15)
BUN: 59 mg/dL — ABNORMAL HIGH (ref 8–23)
CO2: 27 mmol/L (ref 22–32)
Calcium: 7.9 mg/dL — ABNORMAL LOW (ref 8.9–10.3)
Chloride: 99 mmol/L (ref 98–111)
Creatinine, Ser: 5.11 mg/dL — ABNORMAL HIGH (ref 0.61–1.24)
GFR, Estimated: 11 mL/min — ABNORMAL LOW (ref >60)
Glucose, Bld: 66 mg/dL — ABNORMAL LOW (ref 70–99)
Phosphorus: 5.2 mg/dL — ABNORMAL HIGH (ref 2.5–4.6)
Potassium: 4.1 mmol/L (ref 3.5–5.1)
Sodium: 135 mmol/L (ref 135–145)

## 2021-08-15 LAB — GLUCOSE, CAPILLARY
Glucose-Capillary: 51 mg/dL — ABNORMAL LOW (ref 70–99)
Glucose-Capillary: 60 mg/dL — ABNORMAL LOW (ref 70–99)
Glucose-Capillary: 76 mg/dL (ref 70–99)
Glucose-Capillary: 128 mg/dL — ABNORMAL HIGH (ref 70–99)
Glucose-Capillary: 92 mg/dL (ref 70–99)

## 2021-08-15 MED ORDER — METHOCARBAMOL 500 MG PO TABS
500.0000 mg | ORAL_TABLET | Freq: Three times a day (TID) | ORAL | Status: DC | PRN
  Administered 2021-08-15 – 2021-08-23 (×6): 500 mg via ORAL
  Filled 2021-08-15 (×8): qty 1

## 2021-08-15 MED ORDER — INSULIN GLARGINE-YFGN 100 UNIT/ML ~~LOC~~ SOLN
10.0000 [IU] | Freq: Every day | SUBCUTANEOUS | Status: DC
  Administered 2021-08-15 – 2021-08-20 (×6): 10 [IU] via SUBCUTANEOUS
  Filled 2021-08-15 (×8): qty 0.1
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
## 2021-08-15 NOTE — Plan of Care (Signed)
?  Problem: RH Balance ?Goal: LTG [name] maintain dynamic sitting balance (PT) ?Description: LTG:  [name] maintain dynamic sitting balance with assistance during mobility activities (PT) ?Flowsheets (Taken 08/15/2021 1019) ?LTG: Pt [name] maintain dynamic sitting balance during mobility activities with:: Independent ?Goal: LTG [name] maintain dynamic standing balance (PT) ?Description: LTG:  [name] maintain dynamic standing balance with assistance during mobility activities (PT) ?Flowsheets (Taken 08/15/2021 1019) ?LTG: Pt [name] maintain dynamic standing balance during mobility activities with:: Independent with assistive device  ?  ?Problem: Sit to Stand ?Goal: LTG:  [name] perform sit to stand with assistance level (PT) ?Description: LTG:  [name] perform sit to stand with assistance level (PT) ?Flowsheets (Taken 08/15/2021 1019) ?LTG: PT [name] perform sit to stand in preparation for functional mobility with assistance level: Independent with assistive device ?  ?Problem: RH Bed Mobility ?Goal: LTG [name] perform bed mobility with assist (PT) ?Description: LTG: [name] perform bed mobility with assistance, with/without cues (PT). ?Flowsheets (Taken 08/15/2021 1019) ?LTG: Pt [name] perform bed mobility with assistance level of: Independent ?  ?Problem: RH Bed to Chair Transfers ?Goal: LTG [name] perform bed/chair transfers w/assist (PT) ?Description: LTG: [name] perform bed to chair transfers with assistance (PT). ?Flowsheets (Taken 08/15/2021 1019) ?LTG: Pt [name] perform Bed to Chair Transfers with assistance level: Supervision/Verbal cueing ?  ?Problem: RH Car Transfers ?Goal: LTG [name] perform car transfers with assist (PT) ?Description: LTG: [name] perform car transfers with assistance (PT). ?Flowsheets (Taken 08/15/2021 1019) ?LTG: Pt [name] perform car transfers with assist:: Supervision/Verbal cueing ?  ?Problem: RH Ambulation ?Goal: LTG [name] ambulate  in controlled environment (PT) ?Description: LTG: [name] ambulate in a controlled environment, # of feet with assistance (PT). ?Flowsheets (Taken 08/15/2021 1019) ?LTG: Pt [name] ambulate in controlled environ  assist needed:: Supervision/Verbal cueing ?LTG: Ambulation distance in controlled environment: 150 ?Goal: LTG [name] ambulate in home environment (PT) ?Description: LTG: [name] ambulate in home environment, # of feet with assistance (PT). ?Flowsheets (Taken 08/15/2021 1019) ?LTG: Pt [name] ambulate in home environ  assist needed:: Supervision/Verbal cueing ?LTG: Ambulation distance in home environment: 50 ?  ?Problem: RH Stairs ?Goal: LTG [name] ambulate up and down stairs w/assist (PT) ?Description: LTG: [name] ambulate up and down # of stairs with assistance (PT) ?Flowsheets (Taken 08/15/2021 1019) ?LTG: Pt [name] ambulate up/down stairs assist needed:: Supervision/Verbal cueing ?LTG: Pt [name]  ambulate up and down number of stairs: 5 ?Note: HR per home set up  ?  ?

## 2021-08-15 NOTE — Progress Notes (Signed)
Hypoglycemic Event ? ?CBG: 51 ? ?Treatment: 8 oz juice/soda ? ?Symptoms: None ? ?Follow-up CBG: Time: 8119 CBG Result: 76 ? ?Possible Reasons for Event: Medication regimen: 15 units of semglee ? ?Comments/MD notified: protocol followed ? ? ? ?Chilton Si ? ? ?
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
## 2021-08-15 NOTE — Plan of Care (Signed)
?  Problem: RH Swallowing ?Goal: LTG [name] consume least restrictive diet using compensatory strategies with assistance (SLP) ?Description: LTG:  [name] consume least restrictive diet using compensatory strategies with assistance (SLP) ?Flowsheets (Taken 08/15/2021 1236) ?LTG: [name] consume least restrictive diet using compensatory strategies with assistance of (SLP): Supervision ?Goal: LTG Pt [name] demonstrate functional change in swallow as evidenced by bedside/clinical objective assessment (SLP) ?Description: LTG: [name] demonstrate functional change in swallow as evidenced by bedside/clinical objective assessment (SLP) ?Flowsheets (Taken 08/15/2021 1236) ?LTG: [name] demonstrate functional change in swallow as evidenced by bedside/clinical objective assessment: Oropharyngeal swallow ?  ?Problem: RH Problem Solving ?Goal: LTG [name] demonstrate problem solving for (SLP) ?Description: LTG:  [name] demonstrate problem solving for basic/complex daily situations with cues  (SLP) ?Flowsheets (Taken 08/15/2021 1236) ?LTG: [name] demonstrate problem solving for (SLP): Basic daily situations ?LTG [name] demonstrate problem solving for: Supervision ?  ?Problem: RH Memory ?Goal: LTG [name] demonstrate ability for day to day (SLP) ?Description: LTG:   [name] demonstrate ability for day to day recall/carryover during cognitive/linguistic activities with assist  (SLP) ?Flowsheets (Taken 08/15/2021 1236) ?LTG: [name] demonstrate ability for day to day recall: New information ?LTG: [name] demonstrate ability for day to day recall/carryover during cognitive/linguistic activities with assist (SLP): Supervision ?Goal: LTG [name] use memory compensatory aids to (SLP) ?Description: LTG:  [name] use memory compensatory aids to recall biographical/new, daily complex information with cues (SLP) ?Flowsheets (Taken 08/15/2021 1236) ?LTG: [name] use  memory compensatory aids to (SLP): Supervision ?  ?Problem: RH Awareness ?Goal: LTG: [name] demonstrate awareness during functional activites type of (SLP) ?Description: LTG: [name] demonstrate awareness during functional activites type of (SLP) ?Flowsheets (Taken 08/15/2021 1236) ?LTG: [name] demonstrate awareness during cognitive/linguistic activities with assistance of (SLP): Supervision ?  ?
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
## 2021-08-16 LAB — GLUCOSE, CAPILLARY
Glucose-Capillary: 112 mg/dL — ABNORMAL HIGH (ref 70–99)
Glucose-Capillary: 158 mg/dL — ABNORMAL HIGH (ref 70–99)
Glucose-Capillary: 166 mg/dL — ABNORMAL HIGH (ref 70–99)
Glucose-Capillary: 220 mg/dL — ABNORMAL HIGH (ref 70–99)
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
## 2021-08-16 NOTE — IPOC Note (Signed)
Overall Plan of Care (IPOC) ?Patient Details ?Name: [name]. ?MRN: [id number] ?DOB: Sep 05, 1950 ? ?Admitting Diagnosis: Anoxic brain injury ([place]) ? ?Hospital Problems: Principal Problem: ?  Anoxic brain injury ([place]) ?Active Problems: ?  Diabetes mellitus type 2 in nonobese [facility]) ?  ESRD on hemodialysis ([place]) ? ? ? ? Functional Problem List: ?Nursing Edema, Endurance, Medication Management, Nutrition, Safety  ?PT Balance, Behavior, Endurance, Motor, Nutrition, Pain, Perception, Safety, Sensory, Skin Integrity  ?OT Balance, Cognition, Endurance, Motor, Pain, Safety  ?SLP Cognition, Safety  ?TR    ?    ? Basic ADL?s: ?OT Bathing, Dressing, Toileting  ? ?  Advanced  ADL?s: ?OT    ?   ?Transfers: ?PT Bed Mobility, Bed to Chair, Car, Furniture  ?OT Toilet, Tub/Shower  ? ?  Locomotion: ?PT Ambulation, Stairs  ? ?  Additional Impairments: ?OT None  ?SLP Swallowing, Social Cognition ?  ?Problem Solving, Memory, Awareness  ?TR    ? ? ?Anticipated Outcomes ?Item Anticipated Outcome  ?Self Feeding no goal set  ?Swallowing ? Supervision A ?  ?Basic self-care ? (S)  ?Toileting ? mod I ?  ?Bathroom Transfers mod I  ?Bowel/Bladder ? n/a  ?Transfers ? spv  ?Locomotion ? spv  ?Communication ?    ?Cognition ? Supervision A  ?Pain ? n/a  ?Safety/Judgment ? supervision  ? ?Therapy Plan: ?PT Intensity: Minimum of 1-2 x/day ,45 to 90 minutes ?PT Frequency: 5 out of 7 days ?PT Duration Estimated Length of Stay: 7-10 days ?OT Intensity: Minimum of 1-2 x/day, 45 to 90 minutes ?OT Frequency: 5 out of 7 days ?OT Duration/Estimated Length of Stay: 7 days ?SLP Intensity: Minumum of 1-2 x/day, 30 to 90 minutes ?SLP Frequency: 3 to 5 out of 7 days ?SLP Duration/Estimated Length of Stay: 7 days  ? ?Due to the current state of emergency, patients may not be receiving their 3-hours of Medicare-mandated therapy. ? ? Team Interventions: ?Nursing Interventions Patient/Family Education, Disease Management/Prevention, Medication Management,  Cognitive Remediation/Compensation, Discharge Planning, Psychosocial Support  ?PT interventions Ambulation/gait training, Cognitive remediation/compensation, Discharge planning, DME/adaptive equipment instruction, Functional mobility training, Pain management, Psychosocial support, Splinting/orthotics, Therapeutic Activities, UE/LE Strength taining/ROM, Visual/perceptual remediation/compensation, Training and development officer, Community reintegration, Disease management/prevention, Functional electrical stimulation, Neuromuscular re-education, Patient/family education, Skin care/wound management, Stair training, Therapeutic Exercise, UE/LE Coordination activities, Wheelchair propulsion/positioning  ?OT Interventions DME/adaptive equipment instruction, Training and development officer, Patient/family education, Therapeutic Activities, Therapeutic Exercise, Psychosocial support, Cognitive remediation/compensation, Community reintegration, Functional mobility training, Self Care/advanced ADL retraining, UE/LE Strength taining/ROM, UE/LE Coordination activities, Discharge planning, Disease mangement/prevention, Pain management  ?SLP Interventions Cognitive remediation/compensation, Internal/external aids, Therapeutic Exercise, Therapeutic Activities, Patient/family education, Functional tasks, Dysphagia/aspiration precaution training, Cueing hierarchy  ?TR Interventions    ?SW/CM Interventions Discharge Planning, Patient/Family Education, Psychosocial Support  ? ?Barriers to Discharge ?MD  Medical stability  ?Nursing Decreased caregiver support, Home environment access/layout, Hemodialysis, Medication compliance ?1 level, 4 steps, no rails. Son can provide 24/7 care.  ?PT Home environment access/layout, Decreased caregiver support, Lack of/limited family support, Hemodialysis ?   ?OT Decreased caregiver support ?[place] need 24/7  ?SLP   ?   ?SW   ?   ? ?Team Discharge Planning: ?Destination: PT-Home ,OT- Home ,  SLP-Home ?Projected Follow-up: PT-Home health PT, 24 hour supervision/assistance, OT-  Home health OT, SLP-24 hour supervision/assistance ?Projected Equipment Needs: PT-To be determined, OT- To be determined, SLP-None recommended by SLP ?Equipment Details: PT- , OT-  ?Patient/family involved in discharge planning: PT- Patient,  OT-Patient, SLP-Patient, Family member/caregiver ? ?MD ELOS:  7 days ?Medical Rehab Prognosis:  Excellent ?Assessment: The patient has been admitted for CIR therapies with the diagnosis of ABI. The team [place] be addressing functional mobility, strength, stamina, balance, safety, adaptive techniques and equipment, self-care, bowel and bladder mgt, patient and caregiver education, NMR, pain control. Goals have been set at supervision to mod I with mobility and selfcare. Anticipated discharge destination is home . ? ?Due to the current state of emergency, patients may not be receiving their 3 hours per day of Medicare-mandated therapy.  ? ? ?Meredith Staggers, MD, FAAPMR   ? ? ?See Team Conference Notes for weekly updates to the plan of care  ?

## 2021-08-17 ENCOUNTER — Inpatient Hospital Stay (HOSPITAL_COMMUNITY): Payer: Medicare PPO | Admitting: Certified Registered Nurse Anesthetist

## 2021-08-17 ENCOUNTER — Encounter (HOSPITAL_COMMUNITY): Payer: Self-pay | Admitting: Physical Medicine & Rehabilitation

## 2021-08-17 ENCOUNTER — Other Ambulatory Visit: Payer: Self-pay

## 2021-08-17 ENCOUNTER — Encounter (HOSPITAL_COMMUNITY)
Admission: RE | Disposition: A | Discharge status: Home / Self Care | Payer: Self-pay | Source: Intra-hospital | Attending: Physical Medicine & Rehabilitation

## 2021-08-17 DIAGNOSIS — I120 Hypertensive chronic kidney disease with stage 5 chronic kidney disease or end stage renal disease: Secondary | ICD-10-CM

## 2021-08-17 DIAGNOSIS — N185 Chronic kidney disease, stage 5: Secondary | ICD-10-CM

## 2021-08-17 DIAGNOSIS — Z992 Dependence on renal dialysis: Secondary | ICD-10-CM

## 2021-08-17 DIAGNOSIS — E1122 Type 2 diabetes mellitus with diabetic chronic kidney disease: Secondary | ICD-10-CM

## 2021-08-17 DIAGNOSIS — N186 End stage renal disease: Secondary | ICD-10-CM

## 2021-08-17 HISTORY — PX: AV FISTULA PLACEMENT: SHX1204

## 2021-08-17 LAB — GLUCOSE, CAPILLARY
Glucose-Capillary: 88 mg/dL (ref 70–99)
Glucose-Capillary: 84 mg/dL (ref 70–99)
Glucose-Capillary: 77 mg/dL (ref 70–99)
Glucose-Capillary: 94 mg/dL (ref 70–99)
Glucose-Capillary: 300 mg/dL — ABNORMAL HIGH (ref 70–99)

## 2021-08-17 LAB — POCT I-STAT, CHEM 8
BUN: 52 mg/dL — ABNORMAL HIGH (ref 8–23)
Calcium, Ion: 1.08 mmol/L — ABNORMAL LOW (ref 1.15–1.40)
Chloride: 98 mmol/L (ref 98–111)
Creatinine, Ser: 5.8 mg/dL — ABNORMAL HIGH (ref 0.61–1.24)
Glucose, Bld: 84 mg/dL (ref 70–99)
HCT: 29 % — ABNORMAL LOW (ref 39.0–52.0)
Hemoglobin: 9.9 g/dL — ABNORMAL LOW (ref 13.0–17.0)
Potassium: 4.2 mmol/L (ref 3.5–5.1)
Sodium: 136 mmol/L (ref 135–145)
TCO2: 27 mmol/L (ref 22–32)

## 2021-08-17 SURGERY — ARTERIOVENOUS (AV) FISTULA CREATION
Anesthesia: General | Site: Arm Lower | Laterality: Left

## 2021-08-17 SURGERY — ARTERIOVENOUS (AV) FISTULA CREATION
Anesthesia: Choice | Laterality: Left

## 2021-08-17 MED ORDER — DEXAMETHASONE SODIUM PHOSPHATE 10 MG/ML IJ SOLN
INTRAMUSCULAR | Status: AC
  Filled 2021-08-17: qty 1

## 2021-08-17 MED ORDER — ALBUTEROL SULFATE HFA 108 (90 BASE) MCG/ACT IN AERS
INHALATION_SPRAY | RESPIRATORY_TRACT | Status: AC
  Filled 2021-08-17: qty 6.7

## 2021-08-17 MED ORDER — CHLORHEXIDINE GLUCONATE 0.12 % MT SOLN
OROMUCOSAL | Status: AC
  Filled 2021-08-17: qty 15

## 2021-08-17 MED ORDER — ONDANSETRON HCL 4 MG/2ML IJ SOLN
INTRAMUSCULAR | Status: DC | PRN
  Administered 2021-08-17: 4 mg via INTRAVENOUS

## 2021-08-17 MED ORDER — CHLORHEXIDINE GLUCONATE 0.12 % MT SOLN
OROMUCOSAL | Status: AC
Start: 2021-08-17 — End: 2021-08-17
  Filled 2021-08-17: qty 15

## 2021-08-17 MED ORDER — ONDANSETRON HCL 4 MG/2ML IJ SOLN
INTRAMUSCULAR | Status: AC
  Filled 2021-08-17: qty 2

## 2021-08-17 MED ORDER — DEXAMETHASONE SODIUM PHOSPHATE 10 MG/ML IJ SOLN
INTRAMUSCULAR | Status: DC | PRN
  Administered 2021-08-17: 4 mg via INTRAVENOUS

## 2021-08-17 MED ORDER — HYDROMORPHONE HCL 1 MG/ML IJ SOLN: 0.2500 mg | INTRAMUSCULAR | Status: DC | PRN

## 2021-08-17 MED ORDER — CEFAZOLIN IN SODIUM CHLORIDE 3-0.9 GM/100ML-% IV SOLN
INTRAVENOUS | Status: AC
  Filled 2021-08-17: qty 100

## 2021-08-17 MED ORDER — FENTANYL CITRATE (PF) 250 MCG/5ML IJ SOLN
INTRAMUSCULAR | Status: AC
  Filled 2021-08-17: qty 5

## 2021-08-17 MED ORDER — OXYCODONE HCL 5 MG/5ML PO SOLN: 5.0000 mg | Freq: Once | ORAL | Status: DC | PRN

## 2021-08-17 MED ORDER — HEPARIN SODIUM (PORCINE) 1000 UNIT/ML IJ SOLN
INTRAMUSCULAR | Status: AC
  Filled 2021-08-17: qty 20

## 2021-08-17 MED ORDER — CHLORHEXIDINE GLUCONATE 0.12 % MT SOLN
15.0000 mL | Freq: Once | OROMUCOSAL | Status: AC
  Administered 2021-08-17: 15 mL via OROMUCOSAL

## 2021-08-17 MED ORDER — HEPARIN 6000 UNIT IRRIGATION SOLUTION
Status: DC | PRN
  Administered 2021-08-17: 1

## 2021-08-17 MED ORDER — PROPOFOL 10 MG/ML IV BOLUS
INTRAVENOUS | Status: DC | PRN
  Administered 2021-08-17: 200 mg via INTRAVENOUS

## 2021-08-17 MED ORDER — ORAL CARE MOUTH RINSE: 15.0000 mL | Freq: Once | OROMUCOSAL | Status: AC

## 2021-08-17 MED ORDER — CEFAZOLIN SODIUM-DEXTROSE 2-4 GM/100ML-% IV SOLN
2.0000 g | Freq: Once | INTRAVENOUS | Status: AC
Start: 2021-08-17 — End: 2021-08-17
  Administered 2021-08-17: 2 g via INTRAVENOUS

## 2021-08-17 MED ORDER — OXYCODONE HCL 5 MG PO TABS: 5.0000 mg | ORAL_TABLET | Freq: Once | ORAL | Status: DC | PRN

## 2021-08-17 MED ORDER — 0.9 % SODIUM CHLORIDE (POUR BTL) OPTIME
TOPICAL | Status: DC | PRN
Start: 2021-08-17 — End: 2021-08-17
  Administered 2021-08-17: 1000 mL

## 2021-08-17 MED ORDER — AMISULPRIDE (ANTIEMETIC) 5 MG/2ML IV SOLN: 10.0000 mg | Freq: Once | INTRAVENOUS | Status: DC | PRN

## 2021-08-17 MED ORDER — HEPARIN SODIUM (PORCINE) 1000 UNIT/ML IJ SOLN
INTRAMUSCULAR | Status: DC | PRN
  Administered 2021-08-17: 3000 [IU] via INTRAVENOUS

## 2021-08-17 MED ORDER — LIDOCAINE 2% (20 MG/ML) 5 ML SYRINGE
INTRAMUSCULAR | Status: AC
  Filled 2021-08-17: qty 5

## 2021-08-17 MED ORDER — LIDOCAINE 2% (20 MG/ML) 5 ML SYRINGE
INTRAMUSCULAR | Status: DC | PRN
  Administered 2021-08-17: 80 mg via INTRAVENOUS

## 2021-08-17 MED ORDER — PHENYLEPHRINE 40 MCG/ML (10ML) SYRINGE FOR IV PUSH (FOR BLOOD PRESSURE SUPPORT)
PREFILLED_SYRINGE | INTRAVENOUS | Status: DC | PRN
  Administered 2021-08-17: 120 ug via INTRAVENOUS
  Administered 2021-08-17: 80 ug via INTRAVENOUS
  Administered 2021-08-17: 40 ug via INTRAVENOUS

## 2021-08-17 MED ORDER — INSULIN ASPART 100 UNIT/ML IJ SOLN: 0.0000 [IU] | INTRAMUSCULAR | Status: DC | PRN

## 2021-08-17 MED ORDER — CEFAZOLIN SODIUM-DEXTROSE 2-4 GM/100ML-% IV SOLN
INTRAVENOUS | Status: AC
  Filled 2021-08-17: qty 100

## 2021-08-17 MED ORDER — FENTANYL CITRATE (PF) 250 MCG/5ML IJ SOLN
INTRAMUSCULAR | Status: DC | PRN
  Administered 2021-08-17: 25 ug via INTRAVENOUS

## 2021-08-17 MED ORDER — OXYCODONE-ACETAMINOPHEN 5-325 MG PO TABS
1.0000 | ORAL_TABLET | Freq: Four times a day (QID) | ORAL | Status: DC | PRN
Start: 2021-08-17 — End: 2021-08-23
  Administered 2021-08-17 – 2021-08-21 (×5): 1 via ORAL
  Filled 2021-08-17 (×6): qty 1

## 2021-08-17 MED ORDER — HEPARIN 6000 UNIT IRRIGATION SOLUTION
Status: AC
  Filled 2021-08-17: qty 500

## 2021-08-17 MED ORDER — LIDOCAINE-EPINEPHRINE (PF) 1 %-1:200000 IJ SOLN
INTRAMUSCULAR | Status: AC
  Filled 2021-08-17: qty 30

## 2021-08-17 MED ORDER — PHENYLEPHRINE HCL-NACL 20-0.9 MG/250ML-% IV SOLN
INTRAVENOUS | Status: DC | PRN
  Administered 2021-08-17: 30 ug/min via INTRAVENOUS

## 2021-08-17 MED ORDER — LIDOCAINE HCL 1 % IJ SOLN
INTRAMUSCULAR | Status: AC
  Filled 2021-08-17: qty 20

## 2021-08-17 MED ORDER — SODIUM CHLORIDE 0.9 % IV SOLN: INTRAVENOUS | Status: DC

## 2021-08-17 MED ORDER — PHENYLEPHRINE 40 MCG/ML (10ML) SYRINGE FOR IV PUSH (FOR BLOOD PRESSURE SUPPORT)
PREFILLED_SYRINGE | INTRAVENOUS | Status: AC
  Filled 2021-08-17: qty 10

## 2021-08-17 MED ORDER — PROMETHAZINE HCL 25 MG/ML IJ SOLN: 6.2500 mg | INTRAMUSCULAR | Status: DC | PRN

## 2021-08-17 SURGICAL SUPPLY — 34 items
ARMBAND PINK RESTRICT EXTREMIT (MISCELLANEOUS) ×4 IMPLANT
BAG COUNTER SPONGE SURGICOUNT (BAG) ×2 IMPLANT
CANISTER SUCT 3000ML PPV (MISCELLANEOUS) ×2 IMPLANT
CLIP VESOCCLUDE MED 6/CT (CLIP) ×2 IMPLANT
CLIP VESOCCLUDE SM WIDE 6/CT (CLIP) ×2 IMPLANT
COVER PROBE W GEL 5X96 (DRAPES) ×2 IMPLANT
DECANTER SPIKE VIAL GLASS SM (MISCELLANEOUS) ×2 IMPLANT
DERMABOND ADVANCED (GAUZE/BANDAGES/DRESSINGS) ×1
DERMABOND ADVANCED .7 DNX12 (GAUZE/BANDAGES/DRESSINGS) ×1 IMPLANT
ELECT REM PT RETURN 9FT ADLT (ELECTROSURGICAL) ×2 IMPLANT
ELECTRODE REM PT RTRN 9FT ADLT (ELECTROSURGICAL) ×1 IMPLANT
GLOVE SRG 8 PF TXTR STRL LF DI (GLOVE) ×1 IMPLANT
GLOVE SURG ENC MOIS LTX SZ7.5 (GLOVE) ×2 IMPLANT
GLOVE SURG UNDER POLY LF SZ8 (GLOVE) ×2
GOWN STRL REUS W/ TWL LRG LVL3 (GOWN DISPOSABLE) ×2 IMPLANT
GOWN STRL REUS W/ TWL XL LVL3 (GOWN DISPOSABLE) ×2 IMPLANT
GOWN STRL REUS W/TWL LRG LVL3 (GOWN DISPOSABLE) ×4
GOWN STRL REUS W/TWL XL LVL3 (GOWN DISPOSABLE) ×4
HEMOSTAT SPONGE AVITENE ULTRA (HEMOSTASIS) IMPLANT
KIT BASIN OR (CUSTOM PROCEDURE TRAY) ×2 IMPLANT
KIT TURNOVER KIT B (KITS) ×2 IMPLANT
NS IRRIG 1000ML POUR BTL (IV SOLUTION) ×2 IMPLANT
PACK CV ACCESS (CUSTOM PROCEDURE TRAY) ×2 IMPLANT
PAD ARMBOARD 7.5X6 YLW CONV (MISCELLANEOUS) ×4 IMPLANT
SLING ARM FOAM STRAP LRG (SOFTGOODS) IMPLANT
SLING ARM FOAM STRAP MED (SOFTGOODS) IMPLANT
SUT MNCRL AB 4-0 PS2 18 (SUTURE) ×2 IMPLANT
SUT PROLENE 6 0 BV (SUTURE) ×3 IMPLANT
SUT PROLENE 7 0 BV 1 (SUTURE) IMPLANT
SUT VIC AB 3-0 SH 27 (SUTURE) ×2
SUT VIC AB 3-0 SH 27X BRD (SUTURE) ×1 IMPLANT
TOWEL GREEN STERILE (TOWEL DISPOSABLE) ×2 IMPLANT
UNDERPAD 30X36 HEAVY ABSORB (UNDERPADS AND DIAPERS) ×2 IMPLANT
WATER STERILE IRR 1000ML POUR (IV SOLUTION) ×2 IMPLANT

## 2021-08-17 NOTE — Anesthesia Postprocedure Evaluation (Signed)
Anesthesia Post Note ? ?Patient: [name]. ? ?Procedure(s) Performed: RADIOCEPHALIC ARTERIOVENOUS (AV)  LEFT ARM FISTULA CREATION (Left: Arm Lower) ? ?  ? ?Patient location during evaluation: PACU ?Anesthesia Type: General ?Level of consciousness: awake and alert, oriented and patient cooperative ?Pain management: pain level controlled ?Vital Signs Assessment: post-procedure vital signs reviewed and stable ?Respiratory status: spontaneous breathing, nonlabored ventilation and respiratory function stable ?Cardiovascular status: blood pressure returned to baseline and stable ?Postop Assessment: no apparent nausea or vomiting ?Anesthetic complications: no ? ? ?No notable events documented. ? ?Last Vitals:  ?Vitals:  ? 08/17/21 1123 08/17/21 1407  ?BP: (!) 149/80 (!) 146/83  ?Pulse: 76 71  ?Resp: 17 (!) 21  ?Temp: 36.8 ?C (!) 36.2 ?C  ?SpO2: 98% 95%  ?  ?Last Pain:  ?Vitals:  ? 08/17/21 1407  ?TempSrc:   ?PainSc: 0-No pain  ? ? ?  ?  ?  ?  ?  ?  ? ?Jarome Matin Archana Eckman ? ? ? ? ?

## 2021-08-17 NOTE — Progress Notes (Signed)
Physical Therapy Note ? ?Patient Details  ?Name: [name]. ?MRN: [id number] ?Date of Birth: 07-06-1950 ?Today's Date: 08/17/2021 ? ? ? ?1500: Patient off unit for procedure in OR. Patient missed 30 min of skilled PT, RN made aware. [place] attempt to make-up missed time as able.    ? ?Doreene Burke PT, DPT ? ?08/17/2021, 15:06 PM  ?

## 2021-08-17 NOTE — Progress Notes (Signed)
Physical Therapy Session Note ? ?Patient Details  ?Name: [name]. ?MRN: [id number] ?Date of Birth: [birth year] ? ?Today's Date: 08/17/2021 ?PT Individual Time: 1050-1100 ?PT Individual Time Calculation (min): 10 min  ? ?Short Term Goals: ?Week 1:  PT Short Term Goal 1 (Week 1): STG= LTG based on ELOS ? ?Skilled Therapeutic Interventions/Progress Updates:  ?   ?Session 1: ?Patient in recliner in the room upon PT arrival. Patient alert and agreeable to PT session. Patient reported 8/10 sternal pain during session, LPN made aware. PT provided repositioning, rest breaks, and distraction as pain interventions throughout session.  ? ?Per LPN, patient to go to OR for procedure and needed to return to bed. Patient performed standing pivot recliner>bed with min A without an AD and cues for forward weight shift and boosting up to stand. He performed sit to supine with supervision and max cues for repositioning to middle of the bed due to poor spatial awareness.  ? ?Educated patient process for making up missed therapy time, as he wanted to be sure he would continue his therapies.  ? ?Transport arrived to take patient down to procedure while patient returned to bed.  ? ?Patient in bed handed off to OR transport team at end of session. ? ?Therapy Documentation ?Precautions:  ?Precautions ?Precautions: Fall ?Restrictions ?Weight Bearing Restrictions: No ?General: ?PT Amount of Missed Time (min): 50 Minutes ?PT Missed Treatment Reason: Unavailable (Comment) (off unit for procedure in OR) ? ? ? ?Therapy/Group: Individual Therapy ? ?Doreene Burke PT, DPT ? ?08/17/2021, 4:00 PM  ?
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
## 2021-08-17 NOTE — Op Note (Addendum)
? ? ?OPERATIVE NOTE ? ? ?PROCEDURE: left radiocephaic arteriovenous fistula placement ? ?PRE-OPERATIVE DIAGNOSIS: end stage renal disease ? ?POST-OPERATIVE DIAGNOSIS: same ? ?SURGEON: Lucas Heck, MD ? ?ASSISTANT(S): Paulo Fruit, PA ? ?ANESTHESIA:  LMA ? ?ESTIMATED BLOOD LOSS: <25 mL  ? ?FINDING(S): ?1.  Cephalic vein: 3 mm, acceptable ?2.  Radial artery: 3 mm, atherosclerotic disease evident ?3.  Venous outflow: palpable thrill  ?4.  Radial flow: palpable radial pulse ? ?SPECIMEN(S):  none ? ?INDICATIONS:   ?[name]. is a 71 y.o. male who presents with end stage renal disease and need for permanent hemodialysis access.  The patient is scheduled for left arm arteriovenous fistula placement.  The patient is aware the risks include but are not limited to: bleeding, infection, steal syndrome, nerve damage, ischemic monomelic neuropathy, failure to mature, and need for additional procedures.  The patient is aware of the risks of the procedure and elects to proceed forward.  An assistant was needed for exposure, to expedite the case, and sew the anastomosis.   ? ?DESCRIPTION: ?After full informed written consent was obtained from the patient, the patient was brought back to the operating room and placed supine upon the operating table.  Prior to induction, the patient received IV antibiotics.   After obtaining adequate anesthesia, the patient was then prepped and draped in the standard fashion for a left arm access procedure.  ? ?I turned my attention first to identifying the patient's distal cephalic vein and radial artery.  Using SonoSite guidance, the location of these vessels were marked out on the skin.  I made a longitudinal incision at the level of the wrist and dissected through the subcutaneous tissue and fascia to gain exposure of the radial artery.  This was noted to be 3 mm in diameter externally.  This was dissected out proximally and distally and controlled with vessel loops .  I then  dissected out the cephalic vein.  This was noted to be 3 mm in diameter externally.  The distal segment of the vein was ligated with a  2-0 silk, and the vein was transected.  The proximal segment was interrogated with serial dilators.  The vein accepted up to a 4 mm dilator without any difficulty.  I then instilled the heparinized saline into the vein and clamped it.  At this point, I reset my exposure of the radial artery.  The patient was given 3,000 units IV heparin.  I then placed the artery under tension proximally and distally.  I made an arteriotomy with a #11 blade, and then I extended the arteriotomy with a Potts scissor.  I injected heparinized saline proximal and distal to this arteriotomy.  The vein was then sewn to the artery in an end-to-side configuration with a running stitch of 6-0 Prolene with the help of my assistant.  Prior to completing this anastomosis, I allowed the vein and artery to backbleed.  There was no evidence of clot from any vessels.  I completed the anastomosis in the usual fashion and then released all vessel loops and clamps.   ? ?There was a palpable thrill in the venous outflow, and there was a palpable radial pulse.  At this point, I irrigated out the surgical wound.  There was no further active bleeding.  The subcutaneous tissue was reapproximated with a running stitch of 3-0 Vicryl.  The skin was then reapproximated with a running subcuticular stitch of 4-0 Monocryl.  The skin was then cleaned, dried, and reinforced with  Dermabond.  The patient tolerated this procedure well.  ? ?COMPLICATIONS: None ? ?CONDITION: Stable ? ?Monica Martinez, MD ?[facility] ?Office: [phone] ? ?[name] ? ? ?08/17/2021, 2:09 PM  ?

## 2021-08-17 NOTE — Care Management (Signed)
Inpatient Rehabilitation Center ?Individual Statement of Services ? ?Patient Name:  [name].  ?Date:  08/17/2021 ? ?Welcome to the [facility].  Our goal is to provide you with an individualized program based on your diagnosis and situation, designed to meet your specific needs.  With this comprehensive rehabilitation program, you Aurthur be expected to participate in at least 3 hours of rehabilitation therapies Monday-Friday, with modified therapy programming on the weekends. ? ?Your rehabilitation program Glynn include the following services:  Physical Therapy (PT), Occupational Therapy (OT), Speech Therapy (ST), 24 hour per day rehabilitation nursing, Therapeutic Recreaction (TR), Psychology, Neuropsychology, Care Coordinator, Rehabilitation Medicine, Nutrition Services, Pharmacy Services, and Other ? ?Weekly team conferences Saagar be held on Tuesdays to discuss your progress.  Your Inpatient Rehabilitation Care Coordinator Ovid talk with you frequently to get your input and to update you on team discussions.  Team conferences with you and your family in attendance may also be held. ? ?Expected length of stay: 7-10 days ? ?Overall anticipated outcome: Independent with Assistive Device to Supervision\ ? ?Depending on your progress and recovery, your program may change. Your Inpatient Rehabilitation Care Coordinator Kelcy coordinate services and Tacari keep you informed of any changes. Your Inpatient Rehabilitation Care Coordinator's name and contact numbers are listed  below. ? ?The following services may also be recommended but are not provided by the [facility]:  ?Driving Evaluations ?Home Health Rehabiltiation Services ?Outpatient Rehabilitation Services ?Vocational Rehabilitation ?  ?Arrangements Jarrod be made to provide these services after discharge if needed.  Arrangements include referral to agencies that provide these services. ? ?Your insurance has been verified to  be:  [employer] ? ?Your primary doctor is:  Naaman Plummer [name] ? ?Pertinent information Nellie be shared with your doctor and your insurance company. ? ?Inpatient Rehabilitation Care Coordinator:  Cathleen Corti [phone] or [phone] ? ?Information discussed with and copy given to patient by: Rana Snare, 08/17/2021, 12:10 PM    ?

## 2021-08-17 NOTE — Transfer of Care (Signed)
Immediate Anesthesia Transfer of Care Note ? ?Patient: [name]. ? ?Procedure(s) Performed: RADIOCEPHALIC ARTERIOVENOUS (AV)  LEFT ARM FISTULA CREATION (Left: Arm Lower) ? ?Patient Location: PACU ? ?Anesthesia Type:General ? ?Level of Consciousness: drowsy, patient cooperative and responds to stimulation ? ?Airway & Oxygen Therapy: Patient Spontanous Breathing ? ?Post-op Assessment: Report given to RN and Post -op Vital signs reviewed and stable ? ?Post vital signs: Reviewed and stable ? ?Last Vitals:  ?Vitals Value Taken Time  ?BP 134/74 08/17/21 1422  ?Temp 36.2 ?C 08/17/21 1407  ?Pulse 69 08/17/21 1424  ?Resp 22 08/17/21 1424  ?SpO2 92 % 08/17/21 1424  ?Vitals shown include unvalidated device data. ? ?Last Pain:  ?Vitals:  ? 08/17/21 1407  ?TempSrc:   ?PainSc: 0-No pain  ?   ? ?  ? ?Complications: No notable events documented. ?

## 2021-08-17 NOTE — Progress Notes (Signed)
Communicated with CIR CSW via secure chat regarding pt's schedule and clinic upon d/c. Pt has been approved for [employer] on TTS (10:20 chair). Navigator Jiyaan need to alert clinic of pt's start date once d/c date is know. If pt is to d/c with a life vest (possible need documented while pt inpt), Nolawi need to make sure clinic staff have received training prior to pt's start date. Rhet assist as needed.  ? ?[name] ?Renal Navigator ?[phone] ?

## 2021-08-17 NOTE — Anesthesia Procedure Notes (Signed)
Procedure Name: LMA Insertion ?Date/Time: 08/17/2021 12:55 PM ?Performed by: Janace Litten, CRNA ?Pre-anesthesia Checklist: Patient identified, Emergency Drugs available, Suction available and Patient being monitored ?Patient Re-evaluated:Patient Re-evaluated prior to induction ?Oxygen Delivery Method: Circle System Utilized ?Preoxygenation: Pre-oxygenation with 100% oxygen ?Induction Type: IV induction ?Ventilation: Mask ventilation without difficulty ?LMA: LMA inserted ?LMA Size: 4.0 ?Number of attempts: 1 ?Airway Equipment and Method: Bite block ?Placement Confirmation: positive ETCO2 ?Tube secured with: Tape ?Dental Injury: Teeth and Oropharynx as per pre-operative assessment  ? ? ? ? ?

## 2021-08-17 NOTE — Progress Notes (Signed)
SW made efforts to meet with pt to see if able to complete assessment. Pt off floor due to procedure. Statement of service left in pt room.  SW Walt follow-up with pt family to discuss discharge.  ? ?55- SW spoke with pt son Linton Rump to introduce self, explain role, and discuss discharge process. When discussing d/c plan, reports that he and his sister work, however there is an older sister that doe snot and may be able to help. Also reports his mother (pt ex-wife) has agreed to help until he is able to be on his own. SW shared ELOS, discussed family edu, and [place] f/u with updates after team conference tomorrow.  ? ?Loralee Pacas, MSW, LCSWA ?Office: [phone] ?Cell: [phone] ?Fax: [phone]  ?

## 2021-08-17 NOTE — Therapy (Signed)
Occupational Therapy Session Note ? ?Patient Details  ?Name: [name]. ?MRN: [id number] ?Date of Birth: 1950/11/07 ? ?Today's Date: 08/17/2021 ?OT Individual Time: 8638-1771 ?OT Individual Time Calculation (min): 60 min  ? ? ?Short Term Goals: ?Week 1:  OT Short Term Goal 1 (Week 1): STG= LTG d/t ELOS ? ?Skilled Therapeutic Interventions/Progress Updates:  ?  Patient received supine in bed with HOB elevated.  Patient sleeping soundly, difficult to arouse.  Once awake though, patient maintained alertness throughout session. Patient assisted from supine to sit with min assist.  Patient able to sit at edge of bed without support.  Patient walked with rolling walker and assist to manage IV pole.  Patient stood to void, then sat to have BM (Liquid stool.)   ?Patient sat at sink to bathe and dress into paper scrubs.  Patient required encouragement to attempt to complete tasks without assistance.  Patient had difficulty managing lower legs and feet - bathing and dressing.  Patient needed increased time and rest breaks between tasks due to fatigue.  Patient with BLE weakness, and using UE to aide in all transitional movements.   ?Patient left sitting up in recliner with safety belt in place, alarm on, call bell/ phone within reach.   ? ?Therapy Documentation ?Precautions:  ?Precautions ?Precautions: Fall ?Restrictions ?Weight Bearing Restrictions: No ? ?Pain: ?Pain Assessment ?Pain Scale: 0-10 ?Pain Score: 0-No pain ? ? ? ? ?Therapy/Group: Individual Therapy ? ?Mariah Milling ?08/17/2021, 12:24 PM ?

## 2021-08-17 NOTE — Progress Notes (Signed)
[facility] [place] ? ?Subjective  - no complaints.  RIJ TDC. ? ? ?Objective ?(!) 149/80 ?76 ?98.3 ?F (36.8 ?C) (Oral) ?17 ?98% ? ?Intake/Output Summary (Last 24 hours) at 08/17/2021 1233 ?Last data filed at 08/17/2021 0700 ?Gross per 24 hour  ?Intake 290 ml  ?Output 200 ml  ?Net 90 ml  ? ? ?Left radial and brachial pulse palpable ?RIJ TDC ? ?Laboratory ?Lab Results: ?Recent Labs  ?  08/15/21 ?6222 08/17/21 ?1158  ?WBC 5.3  --   ?HGB 7.5* 9.9*  ?HCT 24.0* 29.0*  ?PLT 182  --   ? ?BMET ?Recent Labs  ?  08/15/21 ?9798 08/17/21 ?1158  ?NA 135 136  ?K 4.1 4.2  ?CL 99 98  ?CO2 27  --   ?GLUCOSE 66* 84  ?BUN 59* 52*  ?CREATININE 5.11* 5.80*  ?CALCIUM 7.9*  --   ? ? ?COAG ?Lab Results  ?Component Value Date  ? INR 1.5 (H) 07/27/2021  ? ?No results found for: PTT ? ?Assessment/Planning: ? ?Plan left arm AVF vs graft.  Risks and benefits again discussed.  Good basilic vein, possible cephalic vein. ? ?[name] ?08/17/2021 ?12:33 PM ?-- ? ? ?

## 2021-08-17 NOTE — Anesthesia Preprocedure Evaluation (Signed)
Anesthesia Evaluation  ?Patient identified by MRN, date of birth, ID band ?Patient awake ? ? ? ?Reviewed: ?Allergy & Precautions, NPO status , Patient's Chart, lab work & pertinent test results ? ?Airway ?Mallampati: II ? ?TM Distance: >3 FB ?Neck ROM: Full ? ? ? Dental ?no notable dental hx. ? ?  ?Pulmonary ?neg pulmonary ROS,  ?  ?Pulmonary exam normal ?breath sounds clear to auscultation ? ? ? ? ? ? Cardiovascular ?hypertension, Pt. on medications ?negative cardio ROS ?Normal cardiovascular exam ?Rhythm:Regular Rate:Normal ? ? ?  ?Neuro/Psych ?negative neurological ROS ? negative psych ROS  ? GI/Hepatic ?negative GI ROS, Neg liver ROS,   ?Endo/Other  ?negative endocrine ROSdiabetes, Type 2 ? Renal/GU ?ESRF and DialysisRenal disease  ?negative genitourinary ?  ?Musculoskeletal ?negative musculoskeletal ROS ?(+)  ? Abdominal ?  ?Peds ?negative pediatric ROS ?(+)  Hematology ? ?(+) Blood dyscrasia, anemia ,   ?Anesthesia Other Findings ? ? Reproductive/Obstetrics ?negative OB ROS ? ?  ? ? ? ? ? ? ? ? ? ? ? ? ? ?  ?  ? ? ? ? ? ? ? ? ?Anesthesia Physical ?Anesthesia Plan ? ?ASA: 4 ? ?Anesthesia Plan: General  ? ?Post-op Pain Management: Minimal or no pain anticipated  ? ?Induction: Intravenous ? ?PONV Risk Score and Plan: 2 and Ondansetron, Midazolam and Treatment may vary due to age or medical condition ? ?Airway Management Planned: LMA ? ?Additional Equipment:  ? ?Intra-op Plan:  ? ?Post-operative Plan: Extubation in OR ? ?Informed Consent: I have reviewed the patients History and Physical, chart, labs and discussed the procedure including the risks, benefits and alternatives for the proposed anesthesia with the patient or authorized representative who has indicated his/her understanding and acceptance.  ? ? ? ?Dental advisory given ? ?Plan Discussed with: CRNA ? ?Anesthesia Plan Comments:   ? ? ? ? ? ? ?Anesthesia Quick Evaluation ? ?

## 2021-08-17 NOTE — Discharge Instructions (Addendum)
?Inpatient Rehab Discharge Instructions ? ?[name]. ?Discharge date and time: 08/23/21  ? ?Activities/Precautions/ Functional Status: ?Activity: no lifting, driving, or strenuous exercise  till cleared by MD ?Diet: renal diet/diabetic restrictions. Limit fluids to 5 cups a day. Chopped foods, nectar thick liquids.  ?Wound Care: keep wound clean and dry ? ? ?Functional status:  ?___ No restrictions     ___ Walk up steps independently ?_X__ 24/7 supervision/assistance   ___ Walk up steps with assistance ?___ Intermittent supervision/assistance  ___ Bathe/dress independently ?___ Walk with walker     __X_ Bathe/dress with assistance ?___ Walk Independently    ___ Shower independently ?___ Walk with assistance    ___ Shower with assistance ?_X__ No alcohol     ___ Return to work/school ________ ? ? ?COMMUNITY REFERRALS UPON DISCHARGE:   ? ?Outpatient: PT     OT    ST     ?            Agency: [facility]    Phone: [phone] ?            Appointment Date/Time: *Please expect follow-up within 7-10 business days to schedule your appointment. If you have not received follow-up, please be sure to contact the site directly.* ? ?Medical Equipment/Items Ordered: rolling walker  ?                                                Agency/Supplier: [facility] [phone] ? ?GENERAL COMMUNITY RESOURCES FOR PATIENT/FAMILY: ?You have been pre-certified for transportation services with [facility] [phone] to use for dialysis. Please understand there Braydn be a cost for transportation services. Contact their reservation line [phone] to arrange transportation. There Nolon still be scheduled an inpatient appointment in which you Trell need to bring you original application. If you have any questions or concerns, please be sure to contact them directly. ? ? ? ?Special Instructions: ? ? ? ?[facility] --Discharge Instructions ? ?AV Fistula or Graft Surgery for Dialysis  Access ? ?Please refer to the following instructions for your post-procedure care. Your surgeon or physician assistant Jaycub discuss any changes with you. ? ?Activity ? ?You may drive the day following your surgery, if you are comfortable and no longer taking prescription pain medication. Resume full activity as the soreness in your incision resolves. ? ?Bathing/Showering ? ?You may not shower if you have a hemodialysis catheter. ? ?Incision Care ? ?Clean your incision with mild soap and water after 48 hours. Pat the area dry with a clean towel. You do not need a bandage unless otherwise instructed. Do not apply any ointments or creams to your incision. You may have skin glue on your incision. Do not peel it off. It Joe come off on its own in about one week. Your arm may swell a bit after surgery. To reduce swelling use pillows to elevate your arm so it is above your heart. Your doctor Marcquis tell you if you need to lightly wrap your arm with an ACE bandage. ? ?Diet ? ?Resume your normal diet. There are not special food restrictions following this procedure. In order to heal from your surgery, it is CRITICAL to get adequate nutrition. Your body requires vitamins, minerals, and protein. Vegetables are the best source of vitamins and minerals. Vegetables also provide the perfect balance of protein. Processed  food has little nutritional value, so try to avoid this. ? ?Medications ? ?Resume taking all of your medications. If your incision is causing pain, you may take over-the counter pain relievers such as acetaminophen (Tylenol). If you were prescribed a stronger pain medication, please be aware these medications can cause nausea and constipation. Prevent nausea by taking the medication with a snack or meal. Avoid constipation by drinking plenty of fluids and eating foods with high amount of fiber, such as fruits, vegetables, and grains.  ?Do not take Tylenol if you are taking prescription pain medications. ? ?Follow  up ?Your surgeon may want to see you in the office following your access surgery. If so, this Allison be arranged at the time of your surgery. ? ?Please call us immediately for any of the following conditions: ? ?Increased pain, redness, drainage (pus) from your incision site ?Fever of 101 degrees or higher ?Severe or worsening pain at your incision site ?Hand pain or numbness. ? ?Reduce your risk of vascular disease: ? ?Stop smoking. If you would like help, call [facility] at 1-800-QUIT-NOW 973-439-1571) or [facility] at ([phone] [phone] ? ?Manage your cholesterol ?Maintain a desired weight ?Control your diabetes ?Keep your blood pressure down ? ?Dialysis ? ?It Tamim take several weeks to several months for your new dialysis access to be ready for use. Your surgeon Zed determine when it is okay to use it. Your nephrologist Braian continue to direct your dialysis. You can continue to use your Permcath until your new access is ready for use. ? ? ?08/17/2021 ?[name]. ?[id number] ?03/14/1951 ? ?Surgeon(s): ?[name], MD ? ?Procedure(s): ?RADIOCEPHALIC ARTERIOVENOUS (AV)  LEFT ARM FISTULA CREATION ? ? May stick graft immediately  ? May stick graft on designated area only:   ?X Do not stick Left AV fistula for 12 weeks  ? ? ?If you have any questions, please call the office at [phone]. ? ? ?My questions have been answered and I understand these instructions. I Ahmet adhere to these goals and the provided educational materials after my discharge from the hospital. ? ?Patient/Caregiver Signature _______________________________ Date __________ ? ?Clinician Signature _______________________________________ Date __________ ? ?Please bring this form and your medication list with you to all your follow-up doctor's appointments.   ? ? ?Information on my medicine - ELIQUIS? (apixaban) ? ?This medication education was reviewed with me or my healthcare representative as part of my discharge preparation. ? ?Why was  Eliquis? prescribed for you? ?Eliquis? was prescribed for you to reduce the risk of a blood clot forming that can cause a stroke if you have a medical condition called atrial fibrillation (a type of irregular heartbeat). ? ?What do You need to know about Eliquis? ? ?Take your Eliquis? TWICE DAILY - one tablet in the morning and one tablet in the evening with or without food. If you have difficulty swallowing the tablet whole please discuss with your pharmacist how to take the medication safely. ? ?Take Eliquis? exactly as prescribed by your doctor and DO NOT stop taking Eliquis? without talking to the doctor who prescribed the medication.  Stopping may increase your risk of developing a stroke.  Refill your prescription before you run out. ? ?After discharge, you should have regular check-up appointments with your healthcare provider that is prescribing your Eliquis?.  In the future your dose may need to be changed if your kidney function or weight changes by a significant amount or as you get older. ? ?What do you do if you  miss a dose? ?If you miss a dose, take it as soon as you remember on the same day and resume taking twice daily.  Do not take more than one dose of ELIQUIS at the same time to make up a missed dose. ? ?Important Safety Information ?A possible side effect of Eliquis? is bleeding. You should call your healthcare provider right away if you experience any of the following: ?Bleeding from an injury or your nose that does not stop. ?Unusual colored urine (red or dark brown) or unusual colored stools (red or black). ?Unusual bruising for unknown reasons. ?A serious fall or if you hit your head (even if there is no bleeding). ? ?Some medicines may interact with Eliquis? and might increase your risk of bleeding or clotting while on Eliquis?Marland Kitchen To help avoid this, consult your healthcare provider or pharmacist prior to using any new prescription or non-prescription medications, including herbals, vitamins,  non-steroidal anti-inflammatory drugs (NSAIDs) and supplements. ? ?This website has more information on Eliquis? (apixaban): http://www.eliquis.com/eliquis/home ? ?

## 2021-08-17 NOTE — Progress Notes (Signed)
Inpatient Rehabilitation  Patient information reviewed and entered into eRehab system by Ashlinn Hemrick M. Dartanian Knaggs, M.A., CCC/SLP, PPS Coordinator.  Information including medical coding, functional ability and quality indicators Chaysen be reviewed and updated through discharge.    

## 2021-08-17 NOTE — Progress Notes (Signed)
?                                                       PROGRESS NOTE ? ? ?Subjective/Complaints: ?Discussed low CBG  ?Family (daughter and son in law) at bedside , had questions about d/c date  ?ROS- neg CP, SOB, N/V/D ?Objective: ?  ?No results found. ?Recent Labs  ?  08/15/21 ?0947 08/17/21 ?1158  ?WBC 5.3  --   ?HGB 7.5* 9.9*  ?HCT 24.0* 29.0*  ?PLT 182  --   ? ?Recent Labs  ?  08/15/21 ?0962 08/17/21 ?1158  ?NA 135 136  ?K 4.1 4.2  ?CL 99 98  ?CO2 27  --   ?GLUCOSE 66* 84  ?BUN 59* 52*  ?CREATININE 5.11* 5.80*  ?CALCIUM 7.9*  --   ? ? ?Intake/Output Summary (Last 24 hours) at 08/17/2021 1411 ?Last data filed at 08/17/2021 0700 ?Gross per 24 hour  ?Intake 290 ml  ?Output 200 ml  ?Net 90 ml  ?  ? ?  ? ?Physical Exam: ?Vital Signs ?Blood pressure (!) 149/80, pulse 76, temperature 98.3 ?F (36.8 ?C), temperature source Oral, resp. rate 17, height 5\' 9"  (1.753 m), weight 84.8 kg, SpO2 98 %. ? ? ?General: Pt not in acute distress ?Heart: Irregular rate, normal S1/S2, no rubs, or murmurs, sternal tenderness s/p CPR, no chest pain.  UE ROM without pain. ?Lungs: Clear to auscultation, normal unlabored breathing, no adventitious breath sounds. ?Abdomen: Active bowel sounds all quadrants, non-distended, soft to palpation ?Extremities: No edema, clubbing, or cyanosis ?Skin: No rashes, skin intact ?Neurologic: Non-focal, CN II-XII intact, motor strength is 5/5 in bilateral deltoid, bicep, tricep, grip, hip flexor, knee extensors, ankle dorsiflexor and plantar flexor ?Sensory exam normal sensation to light touch and proprioception in bilateral upper and lower extremities.  Normal cerebellar exam: shin to heel and nose to finger. Normal nose finger. ?Musculoskeletal: All 4 extremities FROM. Mild tenderness to palpation right chest. ?Psych: Mood and affect are appropriate ? ? ?Assessment/Plan: ?1. Functional deficits which require 3+ hours per day of interdisciplinary therapy in a comprehensive inpatient rehab  setting. ?Physiatrist is providing close team supervision and 24 hour management of active medical problems listed below. ?Physiatrist and rehab team continue to assess barriers to discharge/monitor patient progress toward functional and medical goals ? ?Care Tool: ? ?Bathing ?   ?Body parts bathed by patient: Right arm, Left arm, Chest, Abdomen, Front perineal area, Buttocks, Right upper leg, Left upper leg, Face  ? Body parts bathed by helper: Right lower leg, Left lower leg ?  ?  ?Bathing assist Assist Level: Minimal Assistance - Patient > 75% ?  ?  ?Upper Body Dressing/Undressing ?Upper body dressing   ?What is the patient wearing?: Pull over shirt ?   ?Upper body assist Assist Level: Moderate Assistance - Patient 50 - 74% ?   ?Lower Body Dressing/Undressing ?Lower body dressing ? ? ?   ?What is the patient wearing?: Incontinence brief ? ?  ? ?Lower body assist Assist for lower body dressing: Moderate Assistance - Patient 50 - 74% ?   ? ?Toileting ?Toileting    ?Toileting assist Assist for toileting: Minimal Assistance - Patient > 75% ?  ?  ?Transfers ?Chair/bed transfer ? ?Transfers assist ?   ? ?Chair/bed transfer assist level: Minimal Assistance - Patient > 75% ?  ?  ?  Locomotion ?Ambulation ? ? ?Ambulation assist ? ?   ? ?Assist level: Minimal Assistance - Patient > 75% ?Assistive device: Walker-rolling ?Max distance: 200  ? ?Walk 10 feet activity ? ? ?Assist ?   ? ?Assist level: Minimal Assistance - Patient > 75% ?Assistive device: Walker-rolling  ? ?Walk 50 feet activity ? ? ?Assist   ? ?Assist level: Minimal Assistance - Patient > 75% ?Assistive device: Walker-rolling  ? ? ?Walk 150 feet activity ? ? ?Assist   ? ?Assist level: Minimal Assistance - Patient > 75% ?Assistive device: Walker-rolling ?  ? ?Walk 10 feet on uneven surface  ?activity ? ? ?Assist Walk 10 feet on uneven surfaces activity did not occur: Safety/medical concerns ? ? ?  ?   ? ?Wheelchair ? ? ? ? ?Assist Is the patient using a  wheelchair?: No ?  ?  ? ?  ?   ? ? ?Wheelchair 50 feet with 2 turns activity ? ? ? ?Assist ? ?  ?  ? ? ?   ? ?Wheelchair 150 feet activity  ? ? ? ?Assist ?   ? ? ?   ? ?Blood pressure (!) 149/80, pulse 76, temperature 98.3 ?F (36.8 ?C), temperature source Oral, resp. rate 17, height 5\' 9"  (1.753 m), weight 84.8 kg, SpO2 98 %. ? ?Medical Problem List and Plan: ?1. 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            -patient may shower ?            -ELOS/Goals: 7-10 days, mod I to sup with PT, OT, and sup with SLP ?2.  Antithrombotics: ?-DVT/anticoagulation:  Mechanical:  Antiembolism stockings, knee (TED hose) Bilateral lower extremities ?Pharmaceutical: Other (comment)--resume Eliquis after procedure ?            -pt is ambulating 100+' in therapy ?            -antiplatelet therapy: n/a ?3. Pain Management: tylenol prn ?-muscle rub or heat/ice to chest wall prn, lidocaine patch pt requesting muscle relaxer , add methocarbamol  ?-4/3 Continue plan for sternal pain management (s/p CPR) ?4. Mood: team to provide egosupport as necessary ?            -antipsychotic agents: n/a ?5. Neuropsych: This patient is capable of making decisions on his own behalf. ?6. Skin/Wound Care: continue local care ?            -maximize nutritional intake ?7. Fluids/Electrolytes/Nutrition/dysphagia: pt cleared for D1/nectar diet by SLP 3/30 ?            -resume diet and make NPO Sunday midnight.  ?            -begin diet as above ?            -monitor PO intake, labs ?8. NICM w/diastolic CHF/PEA arrest: ?9.T2DM: Hgb A1c- 7.2. Monitor BS ac/hs and use SSI for elevated BS ?            --continue insulin Glargline 15 units daily ?CBG (last 3)  ?Recent Labs  ?  08/16/21 ?2107 08/17/21 ?0617 08/17/21 ?1122  ?GLUCAP 220* 88 84  ?Reduce glargine to 10U ?08/17/21 Continue current management, daytime CBG's, trend evening CBG's. ? ?10. ESRD: NPO after midnight Sunday for AVF on Monday ?            --HD TTS- per  Nephro ?            -08/17/21 Left  arm AVF graft done by Dr. [name] (Vascular Surgery) ?            -Pending note from Vascular to resume Eliquis. ?11. Anemia of chronic disease:  On iron for supplementation with Aranesp weekly. ?12. LLL PNA/MSSA bacteremia: To continue ancef thur April 4th. ?4/3 Continue Ancef for one additional day. ?13.  NSVT/PAF: On amiodarone. Eliquis on hold pending procedure.  ?            --monitor HR TID and for symptoms with activity.  ?14. Elevated TSH: Due to stress-->repeat labs in  a month per recs.  ?  ? ?LOS: ?3 days ?A FACE TO FACE EVALUATION WAS PERFORMED ? ?Luetta Nutting ?08/17/2021, 2:11 PM  ? ? ? ?

## 2021-08-18 ENCOUNTER — Encounter (HOSPITAL_COMMUNITY): Payer: Self-pay | Admitting: Vascular Surgery

## 2021-08-18 LAB — RENAL FUNCTION PANEL
Albumin: 2 g/dL — ABNORMAL LOW (ref 3.5–5.0)
Anion gap: 13 (ref 5–15)
BUN: 78 mg/dL — ABNORMAL HIGH (ref 8–23)
CO2: 25 mmol/L (ref 22–32)
Calcium: 7.8 mg/dL — ABNORMAL LOW (ref 8.9–10.3)
Chloride: 99 mmol/L (ref 98–111)
Creatinine, Ser: 6.26 mg/dL — ABNORMAL HIGH (ref 0.61–1.24)
GFR, Estimated: 9 mL/min — ABNORMAL LOW (ref >60)
Glucose, Bld: 254 mg/dL — ABNORMAL HIGH (ref 70–99)
Phosphorus: 6.6 mg/dL — ABNORMAL HIGH (ref 2.5–4.6)
Potassium: 4.6 mmol/L (ref 3.5–5.1)
Sodium: 137 mmol/L (ref 135–145)

## 2021-08-18 LAB — GLUCOSE, CAPILLARY
Glucose-Capillary: 276 mg/dL — ABNORMAL HIGH (ref 70–99)
Glucose-Capillary: 244 mg/dL — ABNORMAL HIGH (ref 70–99)
Glucose-Capillary: 110 mg/dL — ABNORMAL HIGH (ref 70–99)
Glucose-Capillary: 195 mg/dL — ABNORMAL HIGH (ref 70–99)

## 2021-08-18 LAB — CBC
HCT: 24.9 % — ABNORMAL LOW (ref 39.0–52.0)
Hemoglobin: 7.4 g/dL — ABNORMAL LOW (ref 13.0–17.0)
MCH: 27.3 pg (ref 26.0–34.0)
MCHC: 29.7 g/dL — ABNORMAL LOW (ref 30.0–36.0)
MCV: 91.9 fL (ref 80.0–100.0)
Platelets: 211 10*3/uL (ref 150–400)
RBC: 2.71 MIL/uL — ABNORMAL LOW (ref 4.22–5.81)
RDW: 18.6 % — ABNORMAL HIGH (ref 11.5–15.5)
WBC: 5 10*3/uL (ref 4.0–10.5)
nRBC: 0 % (ref 0.0–0.2)

## 2021-08-18 MED ORDER — HEPARIN SODIUM (PORCINE) 1000 UNIT/ML IJ SOLN
INTRAMUSCULAR | Status: AC
Start: 2021-08-18 — End: 2021-08-18
  Administered 2021-08-18: 3200 [IU]
  Filled 2021-08-18: qty 4

## 2021-08-18 MED ORDER — APIXABAN 5 MG PO TABS
5.0000 mg | ORAL_TABLET | Freq: Two times a day (BID) | ORAL | Status: DC
  Administered 2021-08-18 – 2021-08-23 (×10): 5 mg via ORAL
  Filled 2021-08-18 (×11): qty 1

## 2021-08-18 NOTE — Progress Notes (Addendum)
Contact A. Tillery, PA for EP who reviewed Dr. Curt Bears note and reports that life vest was discussed but not recommended at this time.  Dr. Haroldine Laws is following and he Lamine be the one to make final decision (has not been mentioned in any HF notes).  Jonni Sanger Crystian double check with Dr. Curt Bears and reach back for recommendations. ?

## 2021-08-18 NOTE — Progress Notes (Signed)
Physical Therapy Session Note ? ?Patient Details  ?Name: [name]. ?MRN: [id number] ?Date of Birth: 04-Nov-1950 ? ?Today's Date: 08/18/2021 ?PT Individual Time: 1740-8144 ?PT Individual Time Calculation (min): 60 min  ? ?Short Term Goals: ?Week 1:  PT Short Term Goal 1 (Week 1): STG= LTG based on ELOS ? ?Skilled Therapeutic Interventions/Progress Updates:  ?  Pt eager for therapies. Discussed d/c plans (reports his ex -[name] wil lalso plan to stay with him upon d/c when son is at work) and home set-up as well new HD schedule and how this can impact overall energy. Pt performs initial sit > stand from low recliner with CGA and extra time. Pt with tendency for flexed posture during standing/gait activities - cues for improved posture and adjusted RW as well. Pt does appear to guard through chest due to pain/discomfort when stretching open. Pt stood at sink without UE support to comb hair and wash face with close supervision to CGA due to slight posterior lean when reaching overhead with BUE. Functional gait training on unit with RW > 150' with CGA overall for balance and cues for upright posture. Narrow BOS noted at times.  ? ?Focused on NMR for balance retraining on compliant surface while performing functional reaching tasks outside BOS initially with 1 UE support and progressed to no UE support with CGA overall - stiff and slightly flexed posture noted but improved with movement. Progressed to gait training without AD including forwards, backwards, R and L lateral sidestepping. Pt able to gait without AD x 65' x 2 with CGA to min assist due to occasional mild LOB requiring min physical assist to recover. Pt instructed in stair negotiation for home entry practice x 4 steps with B rails and then with L rail only to simulate home access - overall CGA and good safety noted with step to pattern. ? ?Functional strengthening and cardiovascular endurance on Nustep x 11 min on level 5 with BLE only due to sternal  pain/fx.  ? ?No complaints of SOB or fatigue throughout activities. Pt very eager to work through therapies. ? ?Therapy Documentation ?Precautions:  ?Precautions ?Precautions: Fall ?Restrictions ?Weight Bearing Restrictions: No ?  ?Vital Signs: ?HR = 77 bpm ?O2 = 97% ?Pain: ?Reports sternal pain/soreness- notified RN for pain medication. Reports this is ongoing. ? ? ?Therapy/Group: Individual Therapy ? ?Allayne Gitelman ?Lars Masson, PT, DPT, CBIS ? ?08/18/2021, 10:33 AM  ?

## 2021-08-18 NOTE — Patient Care Conference (Signed)
Inpatient RehabilitationTeam Conference and Plan of Care Update ?Date: 08/18/2021   Time: 10:33 AM  ? ? ?Patient Name: [name].      ?Medical Record Number: [id number]  ?Date of Birth: [birth year] ?Sex: Male         ?Room/Bed: 0G26R/4W54O-27 ?Payor Info: Payor: HUMANA MEDICARE / Plan: HUMANA MEDICARE CHOICE PPO / Product Type: *No Product type* /   ? ?Admit Date/Time:  08/14/2021  6:02 PM ? ?Primary Diagnosis:  Anoxic brain injury ([place]) ? ?Hospital Problems: Principal Problem: ?  Anoxic brain injury ([place]) ?Active Problems: ?  Diabetes mellitus type 2 in nonobese [facility]) ?  ESRD on hemodialysis ([place]) ? ? ? ?Expected Discharge Date: Expected Discharge Date: 08/23/21 ? ?Team Members Present: ?Physician leading conference: Dr. Alger Simons ?Social Worker Present: Loralee Pacas, LCSWA ?Nurse Present: Dorthula Nettles, RN ?PT Present: Canary Brim, PT ?OT Present: Willeen Cass, OT ?SLP Present: Weston Anna, SLP ?PPS Coordinator present : Gunnar Fusi, SLP ? ?   Current Status/Progress Goal Weekly Team Focus  ?Bowel/Bladder ? ? continent b/b  remain continent  toilet as needed   ?Swallow/Nutrition/ Hydration ? ? upgraded to Dys 2 4/4, remain NTL  Supervision A  MBS, more trials of thin, D3/regular   ?ADL's ? ? min A to contact guard for ADL and functional mobilty with RW  supervision to mod I  endurance, ADL retraining, safety with RW, fam education, d/c planning   ?Mobility ? ? CGA/min assist overall with RW  supervision ambulatory and stairs  functional mobility, strengthening/endurance, d/c planning, balance   ?Communication ? ?           ?Safety/Cognition/ Behavioral Observations ? Min A  Supervision  increasing awareness, mildly complex problem solving, functional recall   ?Pain ? ? reports chest pain  < 3  assess pain q 4hr and prn   ?Skin ? ? pressure injury x2 left and right sacrum  no new breakdown  assess skin q shift and prn   ? ? ?Discharge Planning:  ?He Sanjuan d/c to home with support from ex-wife  who Reynold provide 24/7 care until pt able to be on his own; and PRN support from children.   ?Team Discussion: ?ESRD, PNA to LLL, sternal fracture. Fistula placed yesterday. Dys 2, nectar thick liquids. Continent B/B, reports pain to chest. Tylenol, Robaxin, and Lidocaine patch in place. Pressure injury x2 to sacrum. Son and patient's ex-wife to provide care at discharge. ? ?Patient on target to meet rehab goals: ?yes, supervision mobility, mod I ADL's, supervision cognition. Currently CGA with RW, CGA/min assist without RW. Doing well with stairs. Has good endurance. Doing trials of thin liquids. Mauro need repeat MBS. ? ?*See Care Plan and progress notes for long and short-term goals.  ? ?Revisions to Treatment Plan:  ?Adjusting medications, schedule repeat MBS.  ? ?Teaching Needs: ?Family education, medication/pain management, skin/wound care, safety awareness, transfer/gait training, etc. ?  ?Current Barriers to Discharge: ?Decreased caregiver support, Home enviroment access/layout, Wound care, and Lack of/limited family support ? ?Possible Resolutions to Barriers: ?Family education ?Order recommended DME ?Follow-up outpatient therapy ?  ? ? Medical Summary ?Current Status: anoxic bi from PEA cardiac arrest, rib/sternal fx's. ESRD on HD. just had avg placed. ? Barriers to Discharge: Medical stability ?  ?Possible Resolutions to [employer]: daily assessment of labs and patient data, wound care. pain mgt ? ? ?Continued Need for Acute Rehabilitation Level of Care: The patient requires daily medical management by a physician with specialized training  in physical medicine and rehabilitation for the following reasons: ?Direction of a multidisciplinary physical rehabilitation program to maximize functional independence : Yes ?Medical management of patient stability for increased activity during participation in an intensive rehabilitation regime.: Yes ?Analysis of laboratory values and/or radiology reports  with any subsequent need for medication adjustment and/or medical intervention. : Yes ? ? ?I attest that I was present, lead the team conference, and concur with the assessment and plan of the team. ? ? ?Dorthula Nettles G ?08/18/2021, 4:31 PM  ? ? ? ? ? ? ?

## 2021-08-18 NOTE — Progress Notes (Addendum)
?                                                       PROGRESS NOTE ? ? ?Subjective/Complaints: No acute complaints overnight.  Doing well post Left AV fistula post op day 1. ? ?ROS- neg CP, SOB, negative for N/V/D ?Objective: ?  ?No results found. ?Recent Labs  ?  08/17/21 ?1158  ?HGB 9.9*  ?HCT 29.0*  ? ?Recent Labs  ?  08/17/21 ?1158  ?NA 136  ?K 4.2  ?CL 98  ?GLUCOSE 84  ?BUN 52*  ?CREATININE 5.80*  ? ? ?Intake/Output Summary (Last 24 hours) at 08/18/2021 1133 ?Last data filed at 08/18/2021 0700 ?Gross per 24 hour  ?Intake 758 ml  ?Output 550 ml  ?Net 208 ml  ?  ? ?  ? ?Physical Exam: ?Vital Signs ?Blood pressure 122/87, pulse 68, temperature 98.5 ?F (36.9 ?C), resp. rate 14, height 5\' 9"  (1.753 m), weight 84.8 kg, SpO2 100 %. ? ? ?General: Pt not in acute distress ?Heart: Regular rate and rhythm, normal S1/S2, no rubs, or murmurs, sternal tenderness s/p CPR, no chest pain or sternal pain. ?Lungs: Clear to auscultation, normal unlabored breathing, no adventitious breath sounds. ?Abdomen: Active bowel sounds all quadrants, soft, non-distended ?Extremities: No edema, clubbing, or cyanosis ?Skin: No rashes, skin intact ?Neurologic: Non-focal, CN II-XII intact, motor strength is 5/5 in bilateral deltoid, bicep, tricep, grip, hip flexor, knee extensors, ankle dorsiflexor and plantar flexor ?Sensory exam normal sensation to light touch and proprioception in bilateral upper and lower extremities.  Normal cerebellar exam: shin to heel and nose to finger. Normal nose finger. ?Musculoskeletal: All 4 extremities FROM. Mild tenderness to palpation right chest. ?Psych: Mood and affect are appropriate ? ?4/4 PE unchanged since yesterday. ? ?Assessment/Plan: ?1. Functional deficits which require 3+ hours per day of interdisciplinary therapy in a comprehensive inpatient rehab setting. ?Physiatrist is providing close team supervision and 24 hour management of active medical problems listed below. ?Physiatrist and rehab team  continue to assess barriers to discharge/monitor patient progress toward functional and medical goals ? ?Care Tool: ? ?Bathing ?   ?Body parts bathed by patient: Right arm, Left arm, Chest, Abdomen, Front perineal area, Buttocks, Right upper leg, Left upper leg, Face  ? Body parts bathed by helper: Right lower leg, Left lower leg ?  ?  ?Bathing assist Assist Level: Minimal Assistance - Patient > 75% ?  ?  ?Upper Body Dressing/Undressing ?Upper body dressing   ?What is the patient wearing?: Pull over shirt ?   ?Upper body assist Assist Level: Moderate Assistance - Patient 50 - 74% ?   ?Lower Body Dressing/Undressing ?Lower body dressing ? ? ?   ?What is the patient wearing?: Incontinence brief ? ?  ? ?Lower body assist Assist for lower body dressing: Moderate Assistance - Patient 50 - 74% ?   ? ?Toileting ?Toileting    ?Toileting assist Assist for toileting: Minimal Assistance - Patient > 75% ?  ?  ?Transfers ?Chair/bed transfer ? ?Transfers assist ?   ? ?Chair/bed transfer assist level: Contact Guard/Touching assist ?  ?  ?Locomotion ?Ambulation ? ? ?Ambulation assist ? ?   ? ?Assist level: Contact Guard/Touching assist ?Assistive device: Walker-rolling ?Max distance: 200'  ? ?Walk 10 feet activity ? ? ?Assist ?   ? ?Assist level:  Contact Guard/Touching assist ?Assistive device: Walker-rolling  ? ?Walk 50 feet activity ? ? ?Assist   ? ?Assist level: Contact Guard/Touching assist ?Assistive device: Walker-rolling  ? ? ?Walk 150 feet activity ? ? ?Assist   ? ?Assist level: Contact Guard/Touching assist ?Assistive device: Walker-rolling ?  ? ?Walk 10 feet on uneven surface  ?activity ? ? ?Assist Walk 10 feet on uneven surfaces activity did not occur: Safety/medical concerns ? ? ?  ?   ? ?Wheelchair ? ? ? ? ?Assist Is the patient using a wheelchair?: No ?  ?  ? ?  ?   ? ? ?Wheelchair 50 feet with 2 turns activity ? ? ? ?Assist ? ?  ?  ? ? ?   ? ?Wheelchair 150 feet activity  ? ? ? ?Assist ?   ? ? ?   ? ?Blood pressure  122/87, pulse 68, temperature 98.5 ?F (36.9 ?C), resp. rate 14, height 5\' 9"  (1.753 m), weight 84.8 kg, SpO2 100 %. ? ?Medical Problem List and Plan: ?1. 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            -patient may shower ?            -ELOS/Goals: 7-10 days, mod I to sup with PT, OT, and sup with SLP ?2.  Antithrombotics: ?-DVT/anticoagulation:  Mechanical:  Antiembolism stockings, knee (TED hose) Bilateral lower extremities ?Pharmaceutical: Other (comment)--resume Eliquis after procedure ?            -pt is ambulating 100+' in therapy ?            -antiplatelet therapy: n/a ?3. Pain Management: tylenol prn ?-muscle rub or heat/ice to chest wall prn, lidocaine patch pt requesting muscle relaxer , add methocarbamol  ?-4/3 Continue plan for sternal pain management (s/p CPR) ?4/4 Today reports no sternal pain. ?4. Mood: team to provide egosupport as necessary ?            -antipsychotic agents: n/a ?5. Neuropsych: This patient is capable of making decisions on his own behalf. ?6. Skin/Wound Care: continue local care ?            -maximize nutritional intake ?7. Fluids/Electrolytes/Nutrition/dysphagia: pt cleared for D1/nectar diet by SLP 3/30 ?            -resume diet and make NPO Sunday midnight.  ?            -begin diet as above ?            -monitor PO intake, labs ?8. NICM w/diastolic CHF/PEA arrest: ?9.T2DM: Hgb A1c- 7.2. Monitor BS ac/hs and use SSI for elevated BS ?            --continue insulin Glargline 15 units daily ?CBG (last 3)  ?Recent Labs  ?  08/17/21 ?2224 08/18/21 ?0716 08/18/21 ?1120  ?GLUCAP 300* 276* 244*  ?Reduce glargine to 10U ?08/17/21 Continue current management, daytime CBG's, trend evening CBG's. ?08/18/21 Continue to monitor and trend CBG's, continue current regimen.  Consider consult to diabetes coordinator if CBG's remain elevated. ? ?10. ESRD: NPO after midnight Sunday for AVF on Monday ?            --HD TTS- per Nephro ?            -08/17/21 Left  arm AVF graft done by Dr. [name] (Vascular Surgery) ?            -Pending note from Vascular to resume  Eliquis ?            -4/4 Per discussion with Dr. Naaman Plummer, Eliquis resumed. ?11. Anemia of chronic disease:  On iron for supplementation with Aranesp weekly. ?12. LLL PNA/MSSA bacteremia: To continue ancef thur April 4th. ?4/3 Continue Ancef for one additional day. ?4/4 Ancef completed. ?13.  NSVT/PAF: On amiodarone. Eliquis was hold pending procedure.  ?-4/4 Per discussion with Dr. Naaman Plummer, Eliquis resumed ?-Per consult with Oda Kilts, PA EP, no indication for Lifevest at this time. ?PEA is not a covered indication and we only use Lifevest as a bridge.      ?--monitor HR TID and for symptoms with activity.  ?14. Elevated TSH: Due to stress-->repeat labs in  a month per recs.  ?  ? ?LOS: ?4 days ?A FACE TO FACE EVALUATION WAS PERFORMED ? ?Luetta Nutting ?08/18/2021, 11:33 AM  ? ? ? ?

## 2021-08-18 NOTE — Progress Notes (Addendum)
Patient ID: [name]., male   DOB: [birth year], 71 y.o.   MRN: [id number] ? ?SW went to meet with pt but pt already off floor for dialysis. SW Mackinley continue to make efforts to meet with pt. SW Marqui continue to coordinate d/c needs.  ? ?4- SW spoke with pt son Linton Rump to provide updates from team conference, and d/c date 4/9. He inquired about life vest. SW informed that pt does not have life vest. He confirms pt Keyontay need transportation to dialysis due to all family working. He Shawna f/u with SW about family edu on Friday after he speaks with his mother since she does not drive, he also Cyncere be present since he is off. SW informed outpatient PT/OT/SLP referral Akai be sent to [facility]. ? ?SW faxed and emailed [facility] [place] application for dialysis transportation and waiting on follow-up. SW  faxed outpatient referral to [facility] ([phone]/[phone]). ? ?*Pt is pre-certified for transportation services with [facility] [place].  ? ?Loralee Pacas, MSW, LCSWA ?Office: [phone] ?Cell: [phone] ?Fax: [phone]  ?

## 2021-08-18 NOTE — Progress Notes (Signed)
?[facility] ?Progress Note  ? ?Subjective:    ?Seen and examined patient at bedside. Seen sitting in recliner. No acute complaints and feeling well. S/p L AVF placement yesterday by Dr. [name]. Plan for HD today. ? ?Objective ?Vitals:  ? 08/18/21 0457 08/18/21 1215 08/18/21 1221 08/18/21 1300  ?BP: 122/87 (!) 141/84 137/72 118/69  ?Pulse: 68 72 71 70  ?Resp: 14 18    ?Temp: 98.5 ?F (36.9 ?C) (!) 97.4 ?F (36.3 ?C)    ?TempSrc:  Tympanic    ?SpO2: 100% 100%    ?Weight:  84.2 kg    ?Height:      ? ?Physical Exam ?General: Older male; awake and alert; NAD ?Heart:S1 and S2; No murmurs, gallops, or rubs ?Lungs: Clear anteriorly and laterally ?Abdomen: Soft and non-tender ?Extremities: no edema BLLE; wearing compression stockings ?Dialysis Access: [facility]; s/p L AVF today (+) B/T ? ?Filed Weights  ? 08/17/21 0500 08/17/21 1123 08/18/21 1215  ?Weight: 81.8 kg 84.8 kg 84.2 kg  ? ? ?Intake/Output Summary (Last 24 hours) at 08/18/2021 1307 ?Last data filed at 08/18/2021 0700 ?Gross per 24 hour  ?Intake 758 ml  ?Output 550 ml  ?Net 208 ml  ? ? ?Additional Objective ?Labs: ?Basic Metabolic Panel: ?Recent Labs  ?Lab 08/12/21 ?4580 08/13/21 ?0412 08/13/21 ?1854 08/14/21 ?0243 08/15/21 ?9983 08/17/21 ?1158  ?NA 137   < > 135 131* 135 136  ?K 3.8   < > 4.2 3.9 4.1 4.2  ?CL 99   < > 98 96* 99 98  ?CO2 28   < > 27 26 27   --   ?GLUCOSE 148*   < > 269* 212* 66* 84  ?BUN 62*   < > 30* 39* 59* 52*  ?CREATININE 4.46*   < > 3.09* 3.63* 5.11* 5.80*  ?CALCIUM 7.9*   < > 7.8* 7.6* 7.9*  --   ?PHOS 4.1  --   --   --  5.2*  --   ? < > = values in this interval not displayed.  ? ?Liver Function Tests: ?Recent Labs  ?Lab 08/12/21 ?0747 08/15/21 ?3825  ?ALBUMIN 2.1* 1.8*  ? ?No results for input(s): LIPASE, AMYLASE in the last 168 hours. ?CBC: ?Recent Labs  ?Lab 08/15/21 ?0539 08/17/21 ?1158 08/18/21 ?1230  ?WBC 5.3  --  5.0  ?HGB 7.5* 9.9* 7.4*  ?HCT 24.0* 29.0* 24.9*  ?MCV 88.2  --  91.9  ?PLT 182  --  211  ? ?Blood Culture ?   ?Component  Value Date/Time  ? SDES BLOOD RIGHT HAND 08/02/2021 0536  ? SDES BLOOD LEFT HAND 08/02/2021 0536  ? SPECREQUEST AEROBIC BOTTLE ONLY Blood Culture adequate volume 08/02/2021 0536  ? SPECREQUEST AEROBIC BOTTLE ONLY Blood Culture adequate volume 08/02/2021 0536  ? CULT  08/02/2021 0536  ?  NO GROWTH 5 DAYS ?Performed at [facility], [place] [address] [place], [place] [address] ?  ? CULT  08/02/2021 0536  ?  NO GROWTH 5 DAYS ?Performed at [facility], [place] [address] [place], [place] [address] ?  ? REPTSTATUS 08/07/2021 FINAL 08/02/2021 0536  ? REPTSTATUS 08/07/2021 FINAL 08/02/2021 0536  ? ? ?Cardiac Enzymes: ?No results for input(s): CKTOTAL, CKMB, CKMBINDEX, TROPONINI in the last 168 hours. ?CBG: ?Recent Labs  ?Lab 08/17/21 ?1410 08/17/21 ?1644 08/17/21 ?2224 08/18/21 ?0716 08/18/21 ?1120  ?GLUCAP 77 94 300* 276* 244*  ? ?Iron Studies: No results for input(s): IRON, TIBC, TRANSFERRIN, FERRITIN in the last 72 hours. ?Lab Results  ?  Component Value Date  ? INR 1.5 (H) 07/27/2021  ? ?Studies/Results: ?No results found. ? ?Medications: ? ferric gluconate (FERRLECIT) IVPB 125 mg (08/15/21 1833)  ? ? acetaminophen  650 mg Oral TID  ? amiodarone  200 mg Oral BID  ? apixaban  5 mg Oral BID  ? Chlorhexidine Gluconate Cloth  6 each Topical BID  ? darbepoetin (ARANESP) injection - DIALYSIS  200 mcg Intravenous Q Tue-HD  ? feeding supplement (NEPRO CARB STEADY)  237 mL Oral BID WC  ? guaiFENesin  600 mg Oral BID  ? insulin aspart  0-6 Units Subcutaneous TID WC  ? insulin glargine-yfgn  10 Units Subcutaneous QHS  ? lidocaine  3 patch Transdermal Q24H  ? mouth rinse  15 mL Mouth Rinse BID  ? multivitamin  1 tablet Oral QHS  ? mupirocin ointment   Nasal BID  ? ? ?Dialysis Orders: ?New Start-schedule to start at [facility] TTS ? ?Assessment/Plan: ?1. Renal-  A on CRF and new start to HD this admit.  First HD 3/22, HD now on TTS schedule here via [facility]. Plan for HD today. ?- also has OP spot assigned -  [facility] TTS.  ?- S/P L AVF  placement today by Dr. [name] ?2. S/p cardiac arrest  Afib-  Cardiology consulted and plan to follow outpatient.  ?3. Anemia-  hgb fluctuating-from 9.9 to now 7.4. Is on iron and esa-both due today-  transfuse as needed ?4. MSSA bact-  found on 3/17-  completed ancef course on 4/2 ?4. Secondary hyperparathyroidism-  PTH 147- no meds-  phos 5.2- no binder but phos has been increasing ?5. HTN/volume-  BP and volume controlled-  no BP meds-  minimal UF with HD required because non oliguric. Euvolemic on exam.  ? ?Tobie Poet, NP ?[facility] ?08/18/2021,1:07 PM ? LOS: 4 days  ?  ?

## 2021-08-18 NOTE — Progress Notes (Signed)
Speech Language Pathology Daily Session Note ? ?Patient Details  ?Name: [name]. ?MRN: [id number] ?Date of Birth: December 17, 1950 ? ?Today's Date: 08/18/2021 ?SLP Individual Time: 1829-9371 ?SLP Individual Time Calculation (min): 45 min ? ?Short Term Goals: ?Week 1: SLP Short Term Goal 1 (Week 1): STG = LTG d/t ELOS ? ?Skilled Therapeutic Interventions: ?Pt seen for skilled ST with focus on cognitive and swallowing goals, pt up in recliner and tearful about not receiving breakfast tray. SLP locating on the cart and provided set up A. Pt consuming 100% Dys 1 meal with no difficulty or s/s aspiration. SLP providing trials of soft and regular solids in addition to provided meal as well as thin liquids via cup. Pt consuming soft solids with increased timeliness of oral phase and no overt s/s or change in vocal quality, improvement since evaluation. Regular solids remain with extended mastication and occ need for liquid assistance. Pt consuming ~8 oz thin water via cup with 2x small throat clear, no change in vocal quality. Due to apparent improving oral and pharyngeal swallow function, diet upgraded to Dys 2 with ongoing swallow precautions. [name] assess readiness for Free Water Protocol in coming tx sessions. SLP facilitating functional problem solving during meal by providing overall Supervision A cues. Pt demonstrates some improvement in awareness of his current cognitive and physical deficits at this time. Pt left in recliner with alarm belt set and all needs within reach, was planning to call his friend. Cont ST POC.  ? ?Pain ?Pain Assessment ?Pain Scale: 0-10 ?Pain Score: 0-No pain ? ?Therapy/Group: Individual Therapy ? ?[name] ?08/18/2021, 9:53 AM ?

## 2021-08-18 NOTE — Progress Notes (Signed)
Contacted by CIR CSW that pt's d/c date is April 9. Contacted Emilie Rutter to make clinic aware pt would need to start on Tuesday, April 11 and to inquire what time pt would need to arrive for first appt. Awaiting a response from clinic and [place] contact pt's son with information.  ? ?Melven Sartorius ?Renal Navigator ?[phone] ?

## 2021-08-18 NOTE — Progress Notes (Signed)
Occupational Therapy Session Note ? ?Patient Details  ?Name: [name]. ?MRN: [id number] ?Date of Birth: [birth year] ? ?Today's Date: 08/18/2021 ?OT Individual Time: 2505-3976 ?OT Individual Time Calculation (min): 26 min  ? ? ?Short Term Goals: ?Week 1:  OT Short Term Goal 1 (Week 1): STG= LTG d/t ELOS ? ?Skilled Therapeutic Interventions/Progress Updates:  ?  Pt received sitting in the recliner with no c/o pain, agreeable to OT session. Pt completed 150 ft of functional mobility with the RW to the therapy gym with CGA to supervision overall. He completed functional activity focused on UE strengthening with dynamic standing balance- gathering weighted balls in a laundry basket, ambulating over 100 ft without an AD with min A overall. He completed 2 trials with several rest breaks standing. He returned to his room and was left sitting in the recliner with all needs met. Chair alarm set.  ? ?Therapy Documentation ?Precautions:  ?Precautions ?Precautions: Fall ?Restrictions ?Weight Bearing Restrictions: No ? ? ?Therapy/Group: Individual Therapy ? ?[name] ?08/18/2021, 6:39 AM ?

## 2021-08-18 NOTE — Progress Notes (Addendum)
[facility] [place] ? ?Subjective  - No complaints of steal left UE ? ? ?Objective ?122/87 ?68 ?98.5 ?F (36.9 ?C) ?14 ?100% ? ?Intake/Output Summary (Last 24 hours) at 08/18/2021 0744 ?Last data filed at 08/18/2021 0100 ?Gross per 24 hour  ?Intake 640 ml  ?Output 550 ml  ?Net 90 ml  ? ? ?Left radial incision healing well ?Palpable thrill in fistula, left hand warm to touch with intact sensation and motor ? ?Assessment/Planning: ?POD #1 left radiocephaic arteriovenous fistula placement ? ?He Dodd f/u in 4-5 weeks for fistula duplex and exam we Melissa schedule this  ?Stable post op fistula without symptoms of steal. ? ?Roxy Horseman ?08/18/2021 ?7:44 AM ?-- ? ?Laboratory ?Lab Results: ?Recent Labs  ?  08/17/21 ?1158  ?HGB 9.9*  ?HCT 29.0*  ? ?BMET ?Recent Labs  ?  08/17/21 ?1158  ?NA 136  ?K 4.2  ?CL 98  ?GLUCOSE 84  ?BUN 52*  ?CREATININE 5.80*  ? ? ?COAG ?Lab Results  ?Component Value Date  ? INR 1.5 (H) 07/27/2021  ? ?No results found for: PTT ? ? ?I have seen and evaluated the patient. I agree with the PA note as documented above.  Postop day 1 status post left radiocephalic AV fistula.  Excellent thrill.  No signs of steal.  Palpable radial pulse.  We D[place] arrange follow-up in 4 to 6 weeks for fistula duplex.  Call vascular with questions or concerns ? ?[name], MD ?[facility] ?Office: [phone] ? ?

## 2021-08-19 LAB — GLUCOSE, CAPILLARY
Glucose-Capillary: 88 mg/dL (ref 70–99)
Glucose-Capillary: 111 mg/dL — ABNORMAL HIGH (ref 70–99)
Glucose-Capillary: 123 mg/dL — ABNORMAL HIGH (ref 70–99)
Glucose-Capillary: 132 mg/dL — ABNORMAL HIGH (ref 70–99)

## 2021-08-19 LAB — HEPATITIS B SURFACE ANTIGEN: Hepatitis B Surface Ag: NONREACTIVE

## 2021-08-19 LAB — HEPATITIS B SURFACE ANTIBODY,QUALITATIVE: Hep B S Ab: NONREACTIVE

## 2021-08-19 MED ORDER — HEPARIN SODIUM (PORCINE) 1000 UNIT/ML DIALYSIS: 1000.0000 [IU] | INTRAMUSCULAR | Status: DC | PRN

## 2021-08-19 MED ORDER — PENTAFLUOROPROP-TETRAFLUOROETH EX AERO: 1.0000 "application " | INHALATION_SPRAY | CUTANEOUS | Status: DC | PRN

## 2021-08-19 MED ORDER — ALTEPLASE 2 MG IJ SOLR: 2.0000 mg | Freq: Once | INTRAMUSCULAR | Status: DC | PRN

## 2021-08-19 MED ORDER — LIDOCAINE HCL (PF) 1 % IJ SOLN: 5.0000 mL | INTRAMUSCULAR | Status: DC | PRN

## 2021-08-19 MED ORDER — SODIUM CHLORIDE 0.9 % IV SOLN: 100.0000 mL | INTRAVENOUS | Status: DC | PRN

## 2021-08-19 MED ORDER — LIDOCAINE-PRILOCAINE 2.5-2.5 % EX CREA: 1.0000 "application " | TOPICAL_CREAM | CUTANEOUS | Status: DC | PRN

## 2021-08-19 NOTE — Progress Notes (Signed)
Speech Language Pathology Daily Session Note ? ?Patient Details  ?Name: [name]. ?MRN: [id number] ?Date of Birth: 20-Nov-1950 ? ?Today's Date: 08/19/2021 ?SLP Individual Time: 1300-1400 ?SLP Individual Time Calculation (min): 60 min ? ?Short Term Goals: ?Week 1: SLP Short Term Goal 1 (Week 1): STG = LTG d/t ELOS ? ?Skilled Therapeutic Interventions: Skilled treatment session focused on cognitive and dysphagia goals. SLP facilitated session by providing extra time and overall Mod verbal and visual cues for functional problem solving during a basic money management task. Despite difficulty, both the patient and his brother report this is baseline. SLP also facilitated session by providing education regarding patient's current swallowing impairments and provided visuals with use of MBS. Patient and family verbalized understanding and all questions were answered. Patient performed oral care via the suction toothbrush with extra time and supervision verbal cues for problem solving and consumed trials of thin liquids via cup without overt s/s of aspiration. Patient with use of multiple swallows. Recommend patient continue current diet with repeat MBS tomorrow to assess swallow function. Patient and family in agreement. Patient left upright in recliner with alarm on and all needs within reach. Continue with current plan of care.  ?   ? ?Pain ?No/Denies Pain  ? ?Therapy/Group: Individual Therapy ? ?Jemima Petko ?08/19/2021, 3:20 PM ?

## 2021-08-19 NOTE — Progress Notes (Signed)
Contacted pt's son and requested that he contact navigator in the next day or so in order to discuss [name]. Navigator shared those arrangements with son while pt still inpt but would like to speak to son personally prior to pt's d/c.[name] add appts to pt's AVS. Pt [name] need to arrive at HD clinic on Tuesday at 9:30.  ? ?[name] ?Renal Navigator ?[phone] ?

## 2021-08-19 NOTE — Progress Notes (Signed)
?                                                       PROGRESS NOTE ? ? ?Subjective/Complaints:  ? ?Pt is upset about interactions with NT overnight.  Actually was in tears this morning. Says it's happened other nights ? ?ROS: Patient denies fever, rash, sore throat, blurred vision, dizziness, nausea, vomiting, diarrhea, cough, shortness of breath or chest pain, joint or back/neck pain, headache   ? ? ?Objective: ?  ?No results found. ?Recent Labs  ?  08/17/21 ?1158 08/18/21 ?1230  ?WBC  --  5.0  ?HGB 9.9* 7.4*  ?HCT 29.0* 24.9*  ?PLT  --  211  ? ?Recent Labs  ?  08/17/21 ?1158 08/18/21 ?1228  ?NA 136 137  ?K 4.2 4.6  ?CL 98 99  ?CO2  --  25  ?GLUCOSE 84 254*  ?BUN 52* 78*  ?CREATININE 5.80* 6.26*  ?CALCIUM  --  7.8*  ? ? ?Intake/Output Summary (Last 24 hours) at 08/19/2021 1304 ?Last data filed at 08/19/2021 0800 ?Gross per 24 hour  ?Intake 354 ml  ?Output 3600 ml  ?Net -3246 ml  ?  ? ?  ? ?Physical Exam: ?Vital Signs ?Blood pressure 122/81, pulse 71, temperature 98.1 ?F (36.7 ?C), resp. rate 14, height 5\' 9"  (1.753 m), weight 82.2 kg, SpO2 99 %. ? ? ?Constitutional: No distress . Vital signs reviewed. ?HEENT: NCAT, EOMI, oral membranes moist ?Neck: supple ?Cardiovascular: RRR without murmur. No JVD    ?Respiratory/Chest: CTA Bilaterally without wheezes or rales. Normal effort    ?GI/Abdomen: BS +, non-tender, non-distended ?Ext: no clubbing, cyanosis, or edema ?Psych:  pleasant but tearful at times ?Neurologic: Non-focal, CN II-XII intact, motor strength is 4-5/5 in bilateral deltoid, bicep, tricep, grip, hip flexor, knee extensors, ankle dorsiflexor and plantar flexor ?Sensory exam normal sensation to light touch and proprioception in bilateral upper and lower extremities.  Normal cerebellar exam: shin to heel and nose to finger. Normal nose finger. ?Musculoskeletal: Full ROM, No pain with AROM or PROM in the neck, trunk, or extremities. Posture appropriate  ? ?  ? ?Assessment/Plan: ?1. Functional deficits which  require 3+ hours per day of interdisciplinary therapy in a comprehensive inpatient rehab setting. ?Physiatrist is providing close team supervision and 24 hour management of active medical problems listed below. ?Physiatrist and rehab team continue to assess barriers to discharge/monitor patient progress toward functional and medical goals ? ?Care Tool: ? ?Bathing ?   ?Body parts bathed by patient: Right arm, Left arm, Chest, Abdomen, Front perineal area, Buttocks, Right upper leg, Left upper leg, Face  ? Body parts bathed by helper: Right lower leg, Left lower leg ?  ?  ?Bathing assist Assist Level: Minimal Assistance - Patient > 75% ?  ?  ?Upper Body Dressing/Undressing ?Upper body dressing   ?What is the patient wearing?: Pull over shirt ?   ?Upper body assist Assist Level: Moderate Assistance - Patient 50 - 74% ?   ?Lower Body Dressing/Undressing ?Lower body dressing ? ? ?   ?What is the patient wearing?: Incontinence brief ? ?  ? ?Lower body assist Assist for lower body dressing: Moderate Assistance - Patient 50 - 74% ?   ? ?Toileting ?Toileting    ?Toileting assist Assist for toileting: Minimal Assistance - Patient > 75% ?  ?  ?  Transfers ?Chair/bed transfer ? ?Transfers assist ?   ? ?Chair/bed transfer assist level: Contact Guard/Touching assist ?  ?  ?Locomotion ?Ambulation ? ? ?Ambulation assist ? ?   ? ?Assist level: Contact Guard/Touching assist ?Assistive device: No Device ?Max distance: >500 ft  ? ?Walk 10 feet activity ? ? ?Assist ?   ? ?Assist level: Contact Guard/Touching assist ?Assistive device: No Device  ? ?Walk 50 feet activity ? ? ?Assist   ? ?Assist level: Contact Guard/Touching assist ?Assistive device: No Device  ? ? ?Walk 150 feet activity ? ? ?Assist   ? ?Assist level: Contact Guard/Touching assist ?Assistive device: No Device ?  ? ?Walk 10 feet on uneven surface  ?activity ? ? ?Assist Walk 10 feet on uneven surfaces activity did not occur: Safety/medical concerns ? ? ?  ?    ? ?Wheelchair ? ? ? ? ?Assist Is the patient using a wheelchair?: No ?  ?  ? ?  ?   ? ? ?Wheelchair 50 feet with 2 turns activity ? ? ? ?Assist ? ?  ?  ? ? ?   ? ?Wheelchair 150 feet activity  ? ? ? ?Assist ?   ? ? ?   ? ?Blood pressure 122/81, pulse 71, temperature 98.1 ?F (36.7 ?C), resp. rate 14, height 5\' 9"  (1.753 m), weight 82.2 kg, SpO2 99 %. ? ?Medical Problem List and Plan: ?1. 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            -patient may shower ?            -ELOS/Goals: 7-10 days, mod I to sup with PT, OT, and sup with SLP ? -Continue CIR therapies including PT, OT  ?2.  Antithrombotics: ?-DVT/anticoagulation:  Mechanical:  resumed eilquis yesterday ?            -pt is ambulating 100+' in therapy ?            -antiplatelet therapy: n/a ?3. Pain Management: tylenol prn ?-muscle rub or heat/ice to chest wall prn, lidocaine patch pt requesting muscle relaxer , add methocarbamol  ?-4/3 Continue plan for sternal pain management (s/p CPR) ?4/5 lidcaine patches are very helpful for pain ?4. Mood: team to provide egosupport as necessary ?            -antipsychotic agents: n/a ?5. Neuropsych: This patient is capable of making decisions on his own behalf. ?6. Skin/Wound Care: continue local care ?            -maximize nutritional intake ?7. Fluids/Electrolytes/Nutrition/dysphagia: pt cleared for D2/nectar diet by SLP 3/30 ?            -resumed diet as above  ?8. NICM w/diastolic CHF/PEA arrest: ?9.T2DM: Hgb A1c- 7.2. Monitor BS ac/hs and use SSI for elevated BS ?            --continue insulin Glargline 15 units daily ?CBG (last 3)  ?Recent Labs  ?  08/18/21 ?2059 08/19/21 ?0640 08/19/21 ?1136  ?GLUCAP 195* 88 111*  ?Reduce glargine to 10U ?08/17/21 Continue current management, daytime CBG's, trend evening CBG's. ?4/5 fair control. No new changes today ? ?10. ESRD: NPO after midnight Sunday for AVF on Monday ?            --HD TTS- per Nephro ?            -08/17/21 Left arm AVF  graft done by Dr. [name] (Vascular Surgery) ?            ?  11. Anemia of chronic disease:  On iron for supplementation with Aranesp weekly. ?12. LLL PNA/MSSA bacteremia: To continue ancef thur April 4th. ?4/3 Continue Ancef for one additional day. ?4/4 Ancef completed. ?13.  NSVT/PAF: On amiodarone. Eliquis was hold pending procedure.  ?-4/4 Per discussion with Dr. Naaman Plummer, Eliquis resumed ?-Per consult with Oda Kilts, PA EP, no indication for Lifevest at this time. ?PEA is not a covered indication and we only use Lifevest as a bridge.  -monitor HR TID and for symptoms with activity.  ?14. Elevated TSH: Due to stress-->repeat labs in  a month per recs.  ?  ? ?LOS: ?5 days ?A FACE TO FACE EVALUATION WAS PERFORMED ? ?Meredith Staggers ?08/19/2021, 1:04 PM  ? ? ? ?

## 2021-08-19 NOTE — Progress Notes (Signed)
Patient ID: Britton Bera., male   DOB: 25-Dec-1950, 71 y.o.   MRN: [id number] ? ?SW spoke with pt son Linton Rump to update on pt being pre-certified for [facility]. He intends to come by today and pick up packet. SW encouraged him to call to arrange transportation and informed it [place] cost. He [place] f/u with SW to confirm if his mother can come in for family edu on Friday.  ? ?SW ordered RW with [facility] via parachute.  ? ?Loralee Pacas, MSW, LCSWA ?Office: [phone] ?Cell: [phone] ?Fax: [phone]  ?

## 2021-08-19 NOTE — Progress Notes (Signed)
Physical Therapy Session Note ? ?Patient Details  ?Name: [name]. ?MRN: [id number] ?Date of Birth: 07-Sep-1950 ? ?Today's Date: 08/19/2021 ?PT Individual Time: 3833-3832 ?PT Individual Time Calculation (min): 58 min  ? ?Short Term Goals: ?Week 1:  PT Short Term Goal 1 (Week 1): STG= LTG based on ELOS ? ?Skilled Therapeutic Interventions/Progress Updates:  ?   ?Patient in recliner with LPN in the room upon PT arrival. Patient alert and agreeable to PT session. Patient reported 2-3/10 steranl pain during session, LPN made aware. PT provided repositioning, rest breaks, and distraction as pain interventions throughout session.  ? ?LPN reports patient's breakfast came up incorrectly, patient had pork sausage, but does not eat pork. A new tray had been ordered. Patient ate his grits at beginning of session without signs of aspiration. Discussed d/c date and planning, patient relieved to be going home this weekend.  ? ?Therapeutic Activity: ?Transfers: Patient performed sit to/from stand x4 with close supervision without AD. Provided verbal cues for forward weight shift. ? ?Gait Training:  ?Patient ambulated >50 feet, >500 feet, and >200 feet without an Ad with CGA. Ambulated with decreased gait speed, decreased step length and height, increased lateral trunk sway, intermittent crossing over during turns, and mild forward trunk lean. Provided verbal cues for erect posture, increased gait speed and arm swing for improved balance, and played music to maintain cadence and gait speed. ? ?Neuromuscular Re-ed: ?Patient performed the following balance and lower extremity motor control activities: ?-standing balance with supervision without upper extremity support 2x2-3 min at sink while washing sink and combing his hair with x1 minor posterior LOB requiring CGA for steadying support and while talking to the nephrology team without LOB ?-alternating side-stepping x2 each direction x2 min progressing to rhythmic stepping iwht  patient saying "1-2 and 1-2" to keep rhythm ?-NuStep x10 min 30-40 SPM, level 4 x6 min and level 5 x4 min focused on maintaining R foot on pedal with jazz music on to assist with rhythmic stepping pattern and increased pace  ?-360 deg turns R/L x3 progressing from min A to close supervision focused on foot placement to reduce crossing over ? ?Patient in recliner with new breakfast tray arriving at end of session with breaks locked, seat belt alarm set, and all needs within reach.  ? ?Therapy Documentation ?Precautions:  ?Precautions ?Precautions: Fall ?Restrictions ?Weight Bearing Restrictions: No ? ? ? ?Therapy/Group: Individual Therapy ? ?Doreene Burke PT, DPT ? ?08/19/2021, 9:07 AM  ?

## 2021-08-19 NOTE — Progress Notes (Signed)
Occupational Therapy Session Note ? ?Patient Details  ?Name: Jamarques Pinedo. ?MRN: [id number] ?Date of Birth: July 17, 1950 ? ?Today's Date: 08/19/2021 ?OT Individual Time: 1005-1105 ?OT Individual Time Calculation (min): 60 min  ? ? ?Short Term Goals: ?Week 1:  OT Short Term Goal 1 (Week 1): STG= LTG d/t ELOS ? ?Skilled Therapeutic Interventions/Progress Updates:  ?  Pt received sitting up in the recliner with no c/o pain, agreeable to ADLs, requesting to shave first. Checked in with LPN to ensure pt not too high of blood thinner to safely shave with a straight razor. He ambulated to the sink with CGA, furniture walking initially but able to stop with min cueing. He sat and completed shaving task with close supervision for safety. He completed UB Adls with min cueing for thoroughness but (S) overall. LB ADLs in standing with CGA. He donned new pants seated (with cueing) with CGA. He transferred back to the recliner with CGA. Pt was left sitting up in the recliner with all needs met, chair alarm set, and call bell within reach.  ? ? ?Therapy Documentation ?Precautions:  ?Precautions ?Precautions: Fall ?Restrictions ?Weight Bearing Restrictions: No ? ?Therapy/Group: Individual Therapy ? ?[name] ?08/19/2021, 6:34 AM ?

## 2021-08-19 NOTE — Progress Notes (Signed)
Physical Therapy Session Note ? ?Patient Details  ?Name: [name]. ?MRN: [id number] ?Date of Birth: 08-31-1950 ? ?Today's Date: 08/19/2021 ?PT Individual Time: 4098-1191 ?PT Individual Time Calculation (min): 25 min  ? ?Short Term Goals: ?Week 1:  PT Short Term Goal 1 (Week 1): STG= LTG based on ELOS ? ?Skilled Therapeutic Interventions/Progress Updates:  ?  Patient received sitting up in recliner, agreeable to PT. He denies pain. Patient ambulating to therapy gym with no AD and MinA. General instability with limited ability to maintain straight path. Patient noted to have poor stepping strategy to attempt to regain balance as needed. Patient making a 69* turn and lost his balance laterally requiring MaxA from PT to recover due to patients limited balance strategies. He requested to complete "the bike." 10 mins on the NuStep completed with LE only for improved CV endurance. Patient ambulating back to his room with CGA/MinA and improved stability with multimodal cues for glute/hip engagement in stance. Patient remaining up in wc, seatbelt alarm on, call light within reach.  ? ?Therapy Documentation ?Precautions:  ?Precautions ?Precautions: Fall ?Restrictions ?Weight Bearing Restrictions: No ? ? ? ?Therapy/Group: Individual Therapy ? ?Debbora Dus ?Debbora Dus, PT, DPT, CBIS ? ?08/19/2021, 7:43 AM  ?

## 2021-08-19 NOTE — Progress Notes (Signed)
Inpatient Rehabilitation Care Coordinator ?Assessment and Plan ?Patient Details  ?Name: [name]. ?MRN: [id number] ?Date of Birth: 1951/04/12 ? ?Today's Date: 08/19/2021 ? ?Hospital Problems: Principal Problem: ?  Anoxic brain injury ([facility]) ?Active Problems: ?  Diabetes mellitus type 2 in nonobese [facility]) ?  ESRD on hemodialysis ([facility]) ? ?Past Medical History:  ?Past Medical History:  ?Diagnosis Date  ? Acute respiratory failure with hypoxia ([facility]) 07/28/2021  ? Cardiac arrest ([facility]) 07/27/2021  ? Diabetes mellitus without complication ([facility])   ? Heart failure with reduced ejection fraction ([facility] [place])   ? Hypertension   ? Hyponatremia 04/14/2019  ? NSVT (nonsustained ventricular tachycardia) ([facility]) 04/14/2019  ? Renal insufficiency 04/14/2019  ? ?Past Surgical History:  ?Past Surgical History:  ?Procedure Laterality Date  ? AV FISTULA PLACEMENT Left 08/17/2021  ? Procedure: RADIOCEPHALIC ARTERIOVENOUS (AV)  LEFT ARM FISTULA CREATION;  Surgeon: [name], MD;  Location: [facility];  Service: Vascular;  Laterality: Left;  ? IR FLUORO GUIDE CV LINE RIGHT  08/05/2021  ? IR US GUIDE VASC ACCESS RIGHT  08/05/2021  ? RIGHT/LEFT HEART CATH AND CORONARY ANGIOGRAPHY N/A 04/16/2019  ? Procedure: RIGHT/LEFT HEART CATH AND CORONARY ANGIOGRAPHY;  Surgeon: [name], MD;  Location: [facility];  Service: Cardiovascular;  Laterality: N/A;  ? ?Social History:  reports that he has an unknown smoking status. He has never used smokeless tobacco. He reports that he does not drink alcohol and does not use drugs. ? ?Family / Support Systems ?Marital Status: Divorced ?Patient Roles: Parent ?Spouse/Significant Other: Divorced ?Children: 4 adult children ?Other Supports: None reported ?Anticipated Caregiver: ex-wife; children PRN ?Ability/Limitations of Caregiver: None reported ?Caregiver Availability: 24/7 ?Family Dynamics: Pt lives alone ? ?Social History ?Preferred language: English ?Religion: Non-Denominational ?Cultural  Background: Pt worked for [employer] until retirement at age 64 ?Education: high school grad ?Health Literacy - How often do you need to have someone help you when you read instructions, pamphlets, or other written material from your doctor or pharmacy?: Never ?Writes: Yes ?Employment Status: Retired ?Date Retired/Disabled/Unemployed: 2017 ?Age Retired: 39 ?Legal History/Current Legal Issues: Denies ?Guardian/Conservator: N/A  ? ?Abuse/Neglect ?Abuse/Neglect Assessment Can Be Completed: Yes ?Physical Abuse: Denies ?Verbal Abuse: Denies ?Sexual Abuse: Denies ?Exploitation of patient/patient's resources: Denies ?Self-Neglect: Denies ? ?Patient response to: ?Social Isolation - How often do you feel lonely or isolated from those around you?: Never ? ?Emotional Status ?Pt's affect, behavior and adjustment status: Pt in good spirits at time of visit. Pt is very thankful for being alive since he does not recall anything that happened and how he ended up being in the hospital. ?Recent Psychosocial Issues: Pt reports some anxiety since this occurred. ?Psychiatric History: Denies any hx ?Substance Abuse History: Denies ? ?Patient / Family Perceptions, Expectations & Goals ?Pt/Family understanding of illness & functional limitations: Pt and family have a general understanding of his care needs ?Premorbid pt/family roles/activities: Independent ?Anticipated changes in roles/activities/participation: Assistance with ADLs/IADLs ?Pt/family expectations/goals: Pt goal is wotk on walking, getting up, and being able to care for himself. ? ?[employer] ?Community Agencies: None ?Premorbid Home Care/DME Agencies: None ?Transportation available at discharge: TBD ?Is the patient able to respond to transportation needs?: Yes ?In the past 12 months, has lack of transportation kept you from medical appointments or from getting medications?: No ?In the past 12 months, has lack of transportation kept you from meetings, work, or  from getting things needed for daily living?: No ?Resource referrals recommended: Neuropsychology ? ?Discharge Planning ?  Living Arrangements: Non-relatives/Friends ?Support Systems: Children, Other relatives ?Type of Residence: Private residence ?Insurance Resources: Multimedia programmer (specify) ([employer] Medicare) ?Financial Resources: Social Security ?Financial Screen Referred: No ?Living Expenses: Mortgage ?Money Management: Patient ?Does the patient have any problems obtaining your medications?: No ?Home Management: Pt managed all homecare needs ?Patient/Family Preliminary Plans: TBD ?Care Coordinator Barriers to Discharge: Decreased caregiver support, Lack of/limited family support ?Care Coordinator Anticipated Follow Up Needs: HH/OP ?Expected length of stay: 4/9 ? ?Clinical Impression ?SW met with pt in room to introduce self, explain role, and discuss discharge process. 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 No HCPOA. No DME.  ? ?SW informed him on being set up for [facility] [facility] in which he was pre-certified for dialysis transportation. He is aware SW Zyiere continue to provide updates to his son.  ? ?Rana Snare ?08/19/2021, 3:20 PM ? ?  ?

## 2021-08-19 NOTE — Progress Notes (Signed)
?[facility] ?Progress Note  ? ?Subjective:    ?Seen and examined patient at bedside. Currently working with PT. No complaints/concerns. Denies SOB, CP, and N/V. Tolerating yesterday's HD Plan for HD 08/20/21 ? ?Objective ?Vitals:  ? 08/18/21 1632 08/18/21 2034 08/18/21 2037 08/19/21 0529  ?BP: 112/68 (!) 144/98 135/72 122/81  ?Pulse: 74 77 77 71  ?Resp: 17 14 14 14   ?Temp: 97.6 ?F (36.4 ?C) 98 ?F (36.7 ?C) 98.3 ?F (36.8 ?C) 98.1 ?F (36.7 ?C)  ?TempSrc: Oral     ?SpO2: 100% 100% 100% 99%  ?Weight:      ?Height:      ? ?Physical Exam ?General: Older male; awake and alert; NAD ?Heart:S1 and S2; No murmurs, gallops, or rubs ?Lungs: Clear anteriorly and laterally ?Abdomen: Soft and non-tender ?Extremities: no edema BLLE; wearing compression stockings ?Dialysis Access: [facility]; s/p L AVF 08/17/21 B/T ? ?Filed Weights  ? 08/17/21 1123 08/18/21 1215 08/18/21 1551  ?Weight: 84.8 kg 84.2 kg 82.2 kg  ? ? ?Intake/Output Summary (Last 24 hours) at 08/19/2021 1237 ?Last data filed at 08/19/2021 0500 ?Gross per 24 hour  ?Intake --  ?Output 3475 ml  ?Net -3475 ml  ? ? ?Additional Objective ?Labs: ?Basic Metabolic Panel: ?Recent Labs  ?Lab 08/14/21 ?0243 08/15/21 ?4818 08/17/21 ?1158 08/18/21 ?1228  ?NA 131* 135 136 137  ?K 3.9 4.1 4.2 4.6  ?CL 96* 99 98 99  ?CO2 26 27  --  25  ?GLUCOSE 212* 66* 84 254*  ?BUN 39* 59* 52* 78*  ?CREATININE 3.63* 5.11* 5.80* 6.26*  ?CALCIUM 7.6* 7.9*  --  7.8*  ?PHOS  --  5.2*  --  6.6*  ? ?Liver Function Tests: ?Recent Labs  ?Lab 08/15/21 ?5631 08/18/21 ?1228  ?ALBUMIN 1.8* 2.0*  ? ?No results for input(s): LIPASE, AMYLASE in the last 168 hours. ?CBC: ?Recent Labs  ?Lab 08/15/21 ?4970 08/17/21 ?1158 08/18/21 ?1230  ?WBC 5.3  --  5.0  ?HGB 7.5* 9.9* 7.4*  ?HCT 24.0* 29.0* 24.9*  ?MCV 88.2  --  91.9  ?PLT 182  --  211  ? ?Blood Culture ?   ?Component Value Date/Time  ? SDES BLOOD RIGHT HAND 08/02/2021 0536  ? SDES BLOOD LEFT HAND 08/02/2021 0536  ? SPECREQUEST AEROBIC BOTTLE ONLY Blood Culture  adequate volume 08/02/2021 0536  ? SPECREQUEST AEROBIC BOTTLE ONLY Blood Culture adequate volume 08/02/2021 0536  ? CULT  08/02/2021 0536  ?  NO GROWTH 5 DAYS ?Performed at [facility], [place] [address], [place], [place] [address] ?  ? CULT  08/02/2021 0536  ?  NO GROWTH 5 DAYS ?Performed at [facility], [place] [address] [place], [place] [address] ?  ? REPTSTATUS 08/07/2021 FINAL 08/02/2021 0536  ? REPTSTATUS 08/07/2021 FINAL 08/02/2021 0536  ? ? ?Cardiac Enzymes: ?No results for input(s): CKTOTAL, CKMB, CKMBINDEX, TROPONINI in the last 168 hours. ?CBG: ?Recent Labs  ?Lab 08/18/21 ?1120 08/18/21 ?1629 08/18/21 ?2059 08/19/21 ?0640 08/19/21 ?1136  ?GLUCAP 244* 110* 195* 88 111*  ? ?Iron Studies: No results for input(s): IRON, TIBC, TRANSFERRIN, FERRITIN in the last 72 hours. ?Lab Results  ?Component Value Date  ? INR 1.5 (H) 07/27/2021  ? ?Studies/Results: ?No results found. ? ?Medications: ? ferric gluconate (FERRLECIT) IVPB 125 mg (08/18/21 1453)  ? ? acetaminophen  650 mg Oral TID  ? amiodarone  200 mg Oral BID  ? apixaban  5 mg Oral BID  ? Chlorhexidine Gluconate Cloth  6 each Topical BID  ?  darbepoetin (ARANESP) injection - DIALYSIS  200 mcg Intravenous Q Tue-HD  ? feeding supplement (NEPRO CARB STEADY)  237 mL Oral BID WC  ? guaiFENesin  600 mg Oral BID  ? insulin aspart  0-6 Units Subcutaneous TID WC  ? insulin glargine-yfgn  10 Units Subcutaneous QHS  ? lidocaine  3 patch Transdermal Q24H  ? mouth rinse  15 mL Mouth Rinse BID  ? multivitamin  1 tablet Oral QHS  ? ? ?Dialysis Orders: ?[facility]-schedule to start at [facility] TTS ? ?Assessment/Plan: ?1. Renal-  Originally admitted AKI on chronic renal failure 2nd ATN with cardiac arrest. First HD 3/22, HD now on TTS schedule here via [facility]. Reviewed labs: noted GFR ranging in 20s since late 2022. SrCr currently not improving. 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  Plan for HD 08/20/21. ?- also has OP spot assigned -  [facility] TTS.  ?- S/P L AVF placement today by Dr. [name] ?2. S/p cardiac arrest  Afib-  Cardiology consulted and plan to follow outpatient.  ?3. Anemia-  hgb fluctuating-from 9.9 to now 7.4. Is on iron and esa-both due today-  transfuse as needed ?4. MSSA bact-  found on 3/17-  completed ancef course on 4/2 ?4. Secondary hyperparathyroidism-  PTH 147- no meds-  phos 5.2- no binder but phos has been increasing ?5. HTN/volume-  BP and volume controlled-  no BP meds-  minimal UF with HD required because non oliguric. Euvolemic on exam.  ?  ?Tobie Poet, NP ?[facility] ?08/19/2021,12:37 PM ? LOS: 5 days  ?  ?

## 2021-08-20 ENCOUNTER — Inpatient Hospital Stay (HOSPITAL_COMMUNITY): Payer: Medicare PPO

## 2021-08-20 LAB — GLUCOSE, CAPILLARY
Glucose-Capillary: 111 mg/dL — ABNORMAL HIGH (ref 70–99)
Glucose-Capillary: 76 mg/dL (ref 70–99)
Glucose-Capillary: 122 mg/dL — ABNORMAL HIGH (ref 70–99)
Glucose-Capillary: 161 mg/dL — ABNORMAL HIGH (ref 70–99)

## 2021-08-20 LAB — RENAL FUNCTION PANEL
Albumin: 1.8 g/dL — ABNORMAL LOW (ref 3.5–5.0)
Anion gap: 7 (ref 5–15)
BUN: 46 mg/dL — ABNORMAL HIGH (ref 8–23)
CO2: 26 mmol/L (ref 22–32)
Calcium: 7.5 mg/dL — ABNORMAL LOW (ref 8.9–10.3)
Chloride: 101 mmol/L (ref 98–111)
Creatinine, Ser: 5.46 mg/dL — ABNORMAL HIGH (ref 0.61–1.24)
GFR, Estimated: 10 mL/min — ABNORMAL LOW (ref >60)
Glucose, Bld: 116 mg/dL — ABNORMAL HIGH (ref 70–99)
Phosphorus: 4.6 mg/dL (ref 2.5–4.6)
Potassium: 4.4 mmol/L (ref 3.5–5.1)
Sodium: 134 mmol/L — ABNORMAL LOW (ref 135–145)

## 2021-08-20 LAB — CBC WITH DIFFERENTIAL/PLATELET
Abs Immature Granulocytes: 0.02 10*3/uL (ref 0.00–0.07)
Basophils Absolute: 0 10*3/uL (ref 0.0–0.1)
Basophils Relative: 1 %
Eosinophils Absolute: 0.1 10*3/uL (ref 0.0–0.5)
Eosinophils Relative: 2 %
HCT: 23.6 % — ABNORMAL LOW (ref 39.0–52.0)
Hemoglobin: 7.2 g/dL — ABNORMAL LOW (ref 13.0–17.0)
Immature Granulocytes: 1 %
Lymphocytes Relative: 25 %
Lymphs Abs: 0.9 10*3/uL (ref 0.7–4.0)
MCH: 27.6 pg (ref 26.0–34.0)
MCHC: 30.5 g/dL (ref 30.0–36.0)
MCV: 90.4 fL (ref 80.0–100.0)
Monocytes Absolute: 0.7 10*3/uL (ref 0.1–1.0)
Monocytes Relative: 21 %
Neutro Abs: 1.7 10*3/uL (ref 1.7–7.7)
Neutrophils Relative %: 50 %
Platelets: 197 10*3/uL (ref 150–400)
RBC: 2.61 MIL/uL — ABNORMAL LOW (ref 4.22–5.81)
RDW: 18.7 % — ABNORMAL HIGH (ref 11.5–15.5)
WBC: 3.4 10*3/uL — ABNORMAL LOW (ref 4.0–10.5)
nRBC: 0 % (ref 0.0–0.2)

## 2021-08-20 LAB — HEPATITIS B SURFACE ANTIBODY, QUANTITATIVE: Hep B S AB Quant (Post): <3.1 m[IU]/mL — ABNORMAL LOW (ref >9.9)

## 2021-08-20 MED ORDER — HEPARIN SODIUM (PORCINE) 1000 UNIT/ML IJ SOLN
INTRAMUSCULAR | Status: AC
  Filled 2021-08-20: qty 4

## 2021-08-20 NOTE — Progress Notes (Signed)
Modified Barium Swallow Progress Note ? ?Patient Details  ?Name: [name]. ?MRN: [id number] ?Date of Birth: Feb 21, 1951 ? ?Today's Date: 08/20/2021 ? ?Modified Barium Swallow completed.  Full report located under Chart Review in the Imaging Section. ? ?Brief recommendations include the following: ? ?Clinical Impression ? Patient presents with a mild oropharyngeal dysphagia that appears mildly improved since last MBS due to a decrease in overall pharyngeal residue. Patient?s deficits are characterized by reduced oral manipulation of solids, reduced pharyngeal squeeze and base-of-tongue retraction, leading to retention of solids and liquids in the pyriform sinuses and valleculae; decreased arytenoid to base of epiglottis closure, leaving room for thin liquids to penetrate into the larynx. There appeared to be generalized weakness with accumulation of material in the pharynx and eventual trace spillage of liquids into the larynx and eventually into the trachea.  Patient with impaired sensation and did not sense the aspiration and patient?s cued cough was ineffective in expelling aspirates.  Nectar-thick liquids did not appear to penetrate as readily, however the difficulty was that over time, residuals in hypopharynx, when thinner, were entering the larynx resulting in trace, shallow penetration. Recommend patient continue his current diet of dysphagia 2 textures with nectar-thick liquids with initiation of the water protocol. ?  ?Swallow Evaluation Recommendations ? ?   ? ? SLP Diet Recommendations: Dysphagia 2 (Fine chop) solids;Nectar thick liquid;Free water protocol after oral care ? ? Liquid Administration via: Cup ? ? Medication Administration: Crushed with puree ? ? Supervision: Patient able to self feed;Intermittent supervision to cue for compensatory strategies ? ? Compensations: Slow rate;Multiple dry swallows after each bite/sip;Follow solids with liquid;Clear throat intermittently ? ?   ? ? Oral Care  Recommendations: Oral care BID ? ? Other Recommendations: Order thickener from pharmacy;Prohibited food (jello, ice cream, thin soups);Remove water pitcher;Have oral suction available ? ? ? ?Nikolaj Geraghty ?08/20/2021,3:31 PM ? ?

## 2021-08-20 NOTE — Progress Notes (Signed)
Physical Therapy Session Note ? ?Patient Details  ?Name: [name]. ?MRN: [id number] ?Date of Birth: 03-12-1951 ? ?Today's Date: 08/20/2021 ?PT Individual Time: 1003-1105 ?PT Individual Time Calculation (min): 62 min  ? ?Short Term Goals: ?Week 1:  PT Short Term Goal 1 (Week 1): STG= LTG based on ELOS ? ?Skilled Therapeutic Interventions/Progress Updates: Pt presented in bed with nsg present placing lidocaine patches and agreeable to therapy. Pt denies pain once patches applied. Pt using urinal for continent void prior to getting OOB. Performed supine to sit with supervision, use of bed features and increased time. Performed Sit to stand with CGA and transferred to sink to brush hair. Pt ambulated to day room with CGA without AD. When pt turning R to exit room, pt's leg crossed causing LOB and mod/maxA from PTA for recovery. In day room pt participated in obstacle course 25ft x 4 including weaving through cones and stepping over thresholds. Pt noted decreased awareness/coordination when weaving through cones creating narrow BOS near scissoring steps however with verbal cues and instruction to decrease speed pt improved. Pt also participated in static balance activity using peg board while standing on Airex. Pt noted initially to have decreased ankle strategy with posterior bias however was able to improve with time. Pt was able to complete peg board without LOB however required cues ~50% for accurate completion of picture. Pt also participated in cornhole while standing on Airex for dynamic balance including use of L and RUE incorporating high/low and crossing midline activity. Pt then ambulated to NuStep and participated L4 x 6 min and L5 x 6 min maintaining ~30-40 SPM for cardiovascular conditioning. Pt handed off to COTA for next session. ?   ? ?Therapy Documentation ?Precautions:  ?Precautions ?Precautions: Fall ?Restrictions ?Weight Bearing Restrictions: No ?General: ?PT Amount of Missed Time (min): 13  Minutes ?PT Missed Treatment Reason: Other (Comment) (scheduling conflict) ?Vital Signs: ?Therapy Vitals ?Temp: (!) 97.5 ?F (36.4 ?C) ?Temp Source: Oral ?Pulse Rate: 79 ?Resp: 16 ?BP: 103/63 ?Patient Position (if appropriate): Lying ?Oxygen Therapy ?SpO2: 99 % ?O2 Device: Room Air ?Pain: ?Pain Assessment ?Pain Scale: 0-10 ?Pain Score: 0-No pain ?Mobility: ?  ?Locomotion : ?   ?Trunk/Postural Assessment : ?   ?Balance: ?  ?Exercises: ?  ?Other Treatments:   ? ? ? ?Therapy/Group: Individual Therapy ? ?Yorel Redder ?08/20/2021, 3:52 PM  ?

## 2021-08-20 NOTE — Progress Notes (Signed)
Patient ID: Lucas Para., male   DOB: May 14, 1951, 71 y.o.   MRN: [id number] ? ?SW spoke with pt son Linton Rump to confirm family edu tomorrow (4/7) 1pm-4pm with his son and ex-wife.  ? ?Loralee Pacas, MSW, LCSWA ?Office: [phone] ?Cell: [phone] ?Fax: [phone]  ?

## 2021-08-20 NOTE — Progress Notes (Signed)
?                                                       PROGRESS NOTE ? ? ?Subjective/Complaints:  ? ?In good spirits today. No complaints. Ready to get moving with therapy again ? ?ROS: Patient denies fever, rash, sore throat, blurred vision, dizziness, nausea, vomiting, diarrhea, cough, shortness of breath or chest pain, joint or back/neck pain, headache, or mood change.  ? ? ?Objective: ?  ?No results found. ?Recent Labs  ?  08/18/21 ?1230 08/20/21 ?0500  ?WBC 5.0 3.4*  ?HGB 7.4* 7.2*  ?HCT 24.9* 23.6*  ?PLT 211 197  ? ?Recent Labs  ?  08/18/21 ?1228 08/20/21 ?0500  ?NA 137 134*  ?K 4.6 4.4  ?CL 99 101  ?CO2 25 26  ?GLUCOSE 254* 116*  ?BUN 78* 46*  ?CREATININE 6.26* 5.46*  ?CALCIUM 7.8* 7.5*  ? ? ?Intake/Output Summary (Last 24 hours) at 08/20/2021 1034 ?Last data filed at 08/20/2021 1000 ?Gross per 24 hour  ?Intake 590 ml  ?Output 300 ml  ?Net 290 ml  ?  ? ?  ? ?Physical Exam: ?Vital Signs ?Blood pressure 113/64, pulse 73, temperature 98.7 ?F (37.1 ?C), resp. rate 18, height 5\' 9"  (1.753 m), weight 84.7 kg, SpO2 99 %. ? ? ?Constitutional: No distress . Vital signs reviewed. ?HEENT: NCAT, EOMI, oral membranes moist ?Neck: supple ?Cardiovascular: RRR without murmur. No JVD    ?Respiratory/Chest: CTA Bilaterally without wheezes or rales. Normal effort    ?GI/Abdomen: BS +, non-tender, non-distended ?Ext: no clubbing, cyanosis, or edema ?Psych: pleasant and cooperative, in good spirits today ?Neurologic: Non-focal, CN II-XII intact, fair insight and awareness.  motor strength is 4-5/5 in bilateral deltoid, bicep, tricep, grip, hip flexor, knee extensors, ankle dorsiflexor and plantar flexor ?Sensory exam normal for light touch and pain in all 4 limbs. No limb ataxia or cerebellar signs. No abnormal tone appreciated.   ?Musculoskeletal: Full ROM, No pain with AROM or PROM in the neck, trunk, or extremities. Posture appropriate  ?Skin: intact. Graft site healing nicely ?  ? ?Assessment/Plan: ?1. Functional deficits which  require 3+ hours per day of interdisciplinary therapy in a comprehensive inpatient rehab setting. ?Physiatrist is providing close team supervision and 24 hour management of active medical problems listed below. ?Physiatrist and rehab team continue to assess barriers to discharge/monitor patient progress toward functional and medical goals ? ?Care Tool: ? ?Bathing ?   ?Body parts bathed by patient: Right arm, Left arm, Chest, Abdomen, Front perineal area, Buttocks, Right upper leg, Left upper leg, Face  ? Body parts bathed by helper: Right lower leg, Left lower leg ?  ?  ?Bathing assist Assist Level: Minimal Assistance - Patient > 75% ?  ?  ?Upper Body Dressing/Undressing ?Upper body dressing   ?What is the patient wearing?: Pull over shirt ?   ?Upper body assist Assist Level: Moderate Assistance - Patient 50 - 74% ?   ?Lower Body Dressing/Undressing ?Lower body dressing ? ? ?   ?What is the patient wearing?: Incontinence brief ? ?  ? ?Lower body assist Assist for lower body dressing: Moderate Assistance - Patient 50 - 74% ?   ? ?Toileting ?Toileting    ?Toileting assist Assist for toileting: Minimal Assistance - Patient > 75% ?  ?  ?Transfers ?Chair/bed transfer ? ?Transfers  assist ?   ? ?Chair/bed transfer assist level: Contact Guard/Touching assist ?  ?  ?Locomotion ?Ambulation ? ? ?Ambulation assist ? ?   ? ?Assist level: Contact Guard/Touching assist ?Assistive device: No Device ?Max distance: >500 ft  ? ?Walk 10 feet activity ? ? ?Assist ?   ? ?Assist level: Contact Guard/Touching assist ?Assistive device: No Device  ? ?Walk 50 feet activity ? ? ?Assist   ? ?Assist level: Contact Guard/Touching assist ?Assistive device: No Device  ? ? ?Walk 150 feet activity ? ? ?Assist   ? ?Assist level: Contact Guard/Touching assist ?Assistive device: No Device ?  ? ?Walk 10 feet on uneven surface  ?activity ? ? ?Assist Walk 10 feet on uneven surfaces activity did not occur: Safety/medical concerns ? ? ?  ?    ? ?Wheelchair ? ? ? ? ?Assist Is the patient using a wheelchair?: No ?  ?  ? ?  ?   ? ? ?Wheelchair 50 feet with 2 turns activity ? ? ? ?Assist ? ?  ?  ? ? ?   ? ?Wheelchair 150 feet activity  ? ? ? ?Assist ?   ? ? ?   ? ?Blood pressure 113/64, pulse 73, temperature 98.7 ?F (37.1 ?C), resp. rate 18, height 5\' 9"  (1.753 m), weight 84.7 kg, SpO2 99 %. ? ?Medical Problem List and Plan: ?1. 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            -patient may shower ?            -ELOS/Goals: 7-10 days, mod I to sup with PT, OT, and sup with SLP ? -Continue CIR therapies including PT, OT, and SLP.  ?2.  Antithrombotics: ?-DVT/anticoagulation:  Mechanical:  resumed eilquis yesterday ?            -pt is ambulating 100+' in therapy ?            -antiplatelet therapy: n/a ?3. Pain Management: tylenol prn ?-muscle rub or heat/ice to chest wall prn, lidocaine patch pt requesting muscle relaxer , added methocarbamol  ?-4/6 lidocaine patches are very helpful for pain ?4. Mood: team to provide egosupport as necessary ?            -antipsychotic agents: n/a ?5. Neuropsych: This patient is capable of making decisions on his own behalf. ?6. Skin/Wound Care: continue local care ?            -maximize nutritional intake ?7. Fluids/Electrolytes/Nutrition/dysphagia: pt cleared for D2/nectar diet by SLP 3/30 ?            -resumed diet as above  ?8. NICM w/diastolic CHF/PEA arrest: ?9.T2DM: Hgb A1c- 7.2. Monitor BS ac/hs and use SSI for elevated BS ?            --continue insulin Glargline 15 units daily ?CBG (last 3)  ?Recent Labs  ?  08/19/21 ?1706 08/19/21 ?2050 08/20/21 ?0612  ?GLUCAP 123* 132* 111*  ?Reduce glargine to 10U ?4/6 improved control. No new changes today ? ?10. ESRD: NPO after midnight Sunday for AVF on Monday ?            --HD TTS- per Nephro ?            -08/17/21 Left arm AVF graft done by Dr. [name] (Vascular Surgery) ?            ?11. Anemia of chronic disease:  On iron for  supplementation with Aranesp weekly. ?  12. LLL PNA/MSSA bacteremia: To continue ancef thur April 4th. ?4/3 Continue Ancef for one additional day. ?4/4 Ancef completed. ?13.  NSVT/PAF: On amiodarone. Eliquis was hold pending procedure.  ?-4/4 Per discussion with Dr. Naaman Plummer, Eliquis resumed ?-Per consult with Oda Kilts, PA EP, no indication for Lifevest at this time. ?PEA is not a covered indication and we only use Lifevest as a bridge.  -monitor HR TID and for symptoms with activity.  ?14. Elevated TSH: Due to stress-->repeat labs in  a month per recs.  ?  ? ?LOS: ?6 days ?A FACE TO FACE EVALUATION WAS PERFORMED ? ?Meredith Staggers ?08/20/2021, 10:34 AM  ? ? ? ?

## 2021-08-20 NOTE — Progress Notes (Signed)
?[facility] ?Progress Note  ? ?Subjective:    ?Seen and examined patient at bedside. Currently sitting up eating lunch. No issues/concerns. Doing well. HD today. ? ?Objective ?Vitals:  ? 08/19/21 1937 08/20/21 0419 08/20/21 0500 08/20/21 1301  ?BP: (!) 196/167 113/64  139/80  ?Pulse: 78 73  81  ?Resp: 18 18  16   ?Temp: 97.8 ?F (36.6 ?C) 98.7 ?F (37.1 ?C)  98.3 ?F (36.8 ?C)  ?TempSrc:      ?SpO2: 99% 99%  99%  ?Weight:   84.7 kg   ?Height:      ? ?Physical Exam ?General: Older male; awake and alert; NAD ?Heart:S1 and S2; No murmurs, gallops, or rubs ?Lungs: Clear anteriorly and laterally ?Abdomen: Soft and non-tender ?Extremities: no edema BLLE; wearing compression stockings ?Dialysis Access: [facility]; s/p L AVF 08/17/21 B/T ? ?Filed Weights  ? 08/18/21 1215 08/18/21 1551 08/20/21 0500  ?Weight: 84.2 kg 82.2 kg 84.7 kg  ? ? ?Intake/Output Summary (Last 24 hours) at 08/20/2021 1325 ?Last data filed at 08/20/2021 1300 ?Gross per 24 hour  ?Intake 460 ml  ?Output 250 ml  ?Net 210 ml  ? ? ?Additional Objective ?Labs: ?Basic Metabolic Panel: ?Recent Labs  ?Lab 08/15/21 ?4627 08/17/21 ?1158 08/18/21 ?1228 08/20/21 ?0500  ?NA 135 136 137 134*  ?K 4.1 4.2 4.6 4.4  ?CL 99 98 99 101  ?CO2 27  --  25 26  ?GLUCOSE 66* 84 254* 116*  ?BUN 59* 52* 78* 46*  ?CREATININE 5.11* 5.80* 6.26* 5.46*  ?CALCIUM 7.9*  --  7.8* 7.5*  ?PHOS 5.2*  --  6.6* 4.6  ? ?Liver Function Tests: ?Recent Labs  ?Lab 08/15/21 ?0350 08/18/21 ?1228 08/20/21 ?0500  ?ALBUMIN 1.8* 2.0* 1.8*  ? ?No results for input(s): LIPASE, AMYLASE in the last 168 hours. ?CBC: ?Recent Labs  ?Lab 08/15/21 ?0938 08/17/21 ?1158 08/18/21 ?1230 08/20/21 ?0500  ?WBC 5.3  --  5.0 3.4*  ?NEUTROABS  --   --   --  1.7  ?HGB 7.5* 9.9* 7.4* 7.2*  ?HCT 24.0* 29.0* 24.9* 23.6*  ?MCV 88.2  --  91.9 90.4  ?PLT 182  --  211 197  ? ?Blood Culture ?   ?Component Value Date/Time  ? SDES BLOOD RIGHT HAND 08/02/2021 0536  ? SDES BLOOD LEFT HAND 08/02/2021 0536  ? SPECREQUEST AEROBIC BOTTLE  ONLY Blood Culture adequate volume 08/02/2021 0536  ? SPECREQUEST AEROBIC BOTTLE ONLY Blood Culture adequate volume 08/02/2021 0536  ? CULT  08/02/2021 0536  ?  NO GROWTH 5 DAYS ?Performed at [facility], [place] [address], [place], [place] [address] ?  ? CULT  08/02/2021 0536  ?  NO GROWTH 5 DAYS ?Performed at [facility], [place] [address] [place], [place] [address] ?  ? REPTSTATUS 08/07/2021 FINAL 08/02/2021 0536  ? REPTSTATUS 08/07/2021 FINAL 08/02/2021 0536  ? ? ?Cardiac Enzymes: ?No results for input(s): CKTOTAL, CKMB, CKMBINDEX, TROPONINI in the last 168 hours. ?CBG: ?Recent Labs  ?Lab 08/19/21 ?1136 08/19/21 ?1706 08/19/21 ?2050 08/20/21 ?0612 08/20/21 ?1159  ?GLUCAP 111* 123* 132* 111* 76  ? ?Iron Studies: No results for input(s): IRON, TIBC, TRANSFERRIN, FERRITIN in the last 72 hours. ?Lab Results  ?Component Value Date  ? INR 1.5 (H) 07/27/2021  ? ?Studies/Results: ?No results found. ? ?Medications: ? sodium chloride    ? sodium chloride    ? ferric gluconate (FERRLECIT) IVPB 125 mg (08/18/21 1453)  ? ? acetaminophen  650 mg Oral TID  ? amiodarone  200 mg Oral BID  ? apixaban  5 mg Oral BID  ? Chlorhexidine Gluconate Cloth  6 each Topical BID  ? darbepoetin (ARANESP) injection - DIALYSIS  200 mcg Intravenous Q Tue-HD  ? feeding supplement (NEPRO CARB STEADY)  237 mL Oral BID WC  ? guaiFENesin  600 mg Oral BID  ? insulin aspart  0-6 Units Subcutaneous TID WC  ? insulin glargine-yfgn  10 Units Subcutaneous QHS  ? lidocaine  3 patch Transdermal Q24H  ? mouth rinse  15 mL Mouth Rinse BID  ? multivitamin  1 tablet Oral QHS  ? ? ?Dialysis Orders: ?New Start-schedule to start at [facility] TTS ? ?Assessment/Plan: ?1. Renal-  Originally admitted AKI on chronic renal failure 2nd ATN with cardiac arrest. First HD 3/22, HD now on TTS schedule here via [facility]. Reviewed labs: noted GFR ranging in 20s since late 2022. SrCr currently not improving. 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 Plan for HD today. ?- also has OP spot assigned -  [facility] TTS.  ?- S/P L AVF placement today by Dr. [name] ?2. S/p cardiac arrest  Afib-  Cardiology consulted and plan to follow outpatient.  ?3. Anemia-  hgb fluctuating-from 9.9 to now 7.4. ESA given 4/4. Is on iron as well-  transfuse as needed ?4. MSSA bact-  found on 3/17-  completed ancef course on 4/2 ?4. Secondary hyperparathyroidism-  PTH 147- no meds-  phos now 4.6- no binder for now ?5. HTN/volume-  BP and volume controlled-  no BP meds-  minimal UF with HD required because non oliguric. Euvolemic on exam.  ? ?Tobie Poet, NP ?[facility] ?08/20/2021,1:25 PM ? LOS: 6 days  ?  ?

## 2021-08-20 NOTE — Progress Notes (Signed)
Occupational Therapy Session Note ? ?Patient Details  ?Name: [name]. ?MRN: [id number] ?Date of Birth: Aug 19, 1950 ? ?Today's Date: 08/20/2021 ?OT Individual Time: 1100-1155 ?OT Individual Time Calculation (min): 55 min  ? ? ?Short Term Goals: ?Week 1:  OT Short Term Goal 1 (Week 1): STG= LTG d/t ELOS ? ?Skilled Therapeutic Interventions/Progress Updates:  ?  Pt with PT on arrival. OT intervention with focus on functional amb wihtout AD, standing balance, bed mobility, toileting, and safety awareness to increase independence with BADLs. Amb from Day room to general gym with rest breakx1. CGA for amb. Standing balance tasks on Airex-folding towels. CGa for standing balance with min verbal cues to correct LOB. Pt carried towels to dirty linen bag. Pt amb without AD to ADL apt and practiced bed mobility. Pt requested use of toilet and amb into bathroom. Pt stood at toilet and sink without assistance. Pt amb back to room. Pt remembered room number and navigated to room without verbal cues. Pt returned to bed and remained in bed with all needs within reach and bed alarm activated.  ? ?Therapy Documentation ?Precautions:  ?Precautions ?Precautions: Fall ?Restrictions ?Weight Bearing Restrictions: No ?  ?Pain: ?Pain Assessment ?Pain Scale: 0-10 ?Pain Score: 0-No pain ? ? ?Therapy/Group: Individual Therapy ? ?Leroy Libman ?08/20/2021, 12:20 PM ?

## 2021-08-20 NOTE — Progress Notes (Signed)
Pt requesting additional sleep medications. PRN trazodone 50mg  not effective.  ?

## 2021-08-21 ENCOUNTER — Other Ambulatory Visit (HOSPITAL_COMMUNITY): Payer: Self-pay

## 2021-08-21 DIAGNOSIS — I482 Chronic atrial fibrillation, unspecified: Secondary | ICD-10-CM

## 2021-08-21 DIAGNOSIS — N186 End stage renal disease: Secondary | ICD-10-CM

## 2021-08-21 LAB — GLUCOSE, CAPILLARY
Glucose-Capillary: 55 mg/dL — ABNORMAL LOW (ref 70–99)
Glucose-Capillary: 64 mg/dL — ABNORMAL LOW (ref 70–99)
Glucose-Capillary: 97 mg/dL (ref 70–99)
Glucose-Capillary: 107 mg/dL — ABNORMAL HIGH (ref 70–99)
Glucose-Capillary: 147 mg/dL — ABNORMAL HIGH (ref 70–99)
Glucose-Capillary: 158 mg/dL — ABNORMAL HIGH (ref 70–99)

## 2021-08-21 MED ORDER — OXYCODONE-ACETAMINOPHEN 5-325 MG PO TABS
1.0000 | ORAL_TABLET | Freq: Three times a day (TID) | ORAL | 0 refills | Status: DC | PRN
  Filled 2021-08-21: qty 27, 9d supply, fill #0

## 2021-08-21 MED ORDER — LIDOCAINE 5 % EX PTCH
3.0000 | MEDICATED_PATCH | CUTANEOUS | 0 refills | Status: DC
  Filled 2021-08-21 (×2): qty 90, 30d supply, fill #0

## 2021-08-21 MED ORDER — APIXABAN 5 MG PO TABS
5.0000 mg | ORAL_TABLET | Freq: Two times a day (BID) | ORAL | 0 refills | Status: DC
  Filled 2021-08-21: qty 60, 30d supply, fill #0

## 2021-08-21 MED ORDER — FOOD THICKENER (SIMPLYTHICK)
10.0000 | ORAL | 0 refills | Status: DC | PRN
Start: 2021-08-21 — End: 2022-07-14
  Filled 2021-08-21: qty 300, fill #0

## 2021-08-21 MED ORDER — RENA-VITE PO TABS
1.0000 | ORAL_TABLET | Freq: Every day | ORAL | 0 refills | Status: DC
  Filled 2021-08-21: qty 30, 30d supply, fill #0

## 2021-08-21 MED ORDER — METHOCARBAMOL 500 MG PO TABS
500.0000 mg | ORAL_TABLET | Freq: Three times a day (TID) | ORAL | 0 refills | Status: DC | PRN
  Filled 2021-08-21: qty 30, 10d supply, fill #0

## 2021-08-21 MED ORDER — MUSCLE RUB 10-15 % EX CREA
1.0000 "application " | TOPICAL_CREAM | CUTANEOUS | 0 refills | Status: DC | PRN
  Filled 2021-08-21: qty 85, 14d supply, fill #0

## 2021-08-21 MED ORDER — AMIODARONE HCL 200 MG PO TABS
200.0000 mg | ORAL_TABLET | Freq: Two times a day (BID) | ORAL | 0 refills | Status: DC
Start: 2021-08-21 — End: 2021-09-14
  Filled 2021-08-21: qty 60, 30d supply, fill #0

## 2021-08-21 MED ORDER — BLOOD GLUCOSE MONITOR KIT: PACK | 0 refills | Status: DC

## 2021-08-21 MED ORDER — INSULIN GLARGINE 100 UNIT/ML SOLOSTAR PEN
10.0000 [IU] | PEN_INJECTOR | Freq: Every day | SUBCUTANEOUS | 0 refills | Status: DC
  Filled 2021-08-21: qty 3, 30d supply, fill #0

## 2021-08-21 MED ORDER — ACETAMINOPHEN 325 MG PO TABS
650.0000 mg | ORAL_TABLET | Freq: Three times a day (TID) | ORAL | 0 refills | Status: DC
Start: 2021-08-21 — End: 2022-07-14
  Filled 2021-08-21: qty 90, 15d supply, fill #0

## 2021-08-21 MED ORDER — TRAZODONE HCL 50 MG PO TABS
25.0000 mg | ORAL_TABLET | Freq: Every evening | ORAL | 0 refills | Status: DC | PRN
  Filled 2021-08-21: qty 30, 30d supply, fill #0

## 2021-08-21 MED ORDER — INSULIN PEN NEEDLE 32G X 4 MM MISC
0 refills | Status: DC
  Filled 2021-08-21: qty 100, 30d supply, fill #0

## 2021-08-21 MED ORDER — GUAIFENESIN ER 600 MG PO TB12
600.0000 mg | ORAL_TABLET | Freq: Two times a day (BID) | ORAL | 0 refills | Status: DC
  Filled 2021-08-21: qty 60, 30d supply, fill #0

## 2021-08-21 NOTE — Progress Notes (Signed)
Physical Therapy Session Note ? ?Patient Details  ?Name: [name]. ?MRN: [id number] ?Date of Birth: 31-Jan-1951 ? ?Today's Date: 08/21/2021 ?PT Individual Time: 8338-2505 ?PT Individual Time Calculation (min): 54 min  ? ?Short Term Goals: ?Week 1:  PT Short Term Goal 1 (Week 1): STG= LTG based on ELOS ? ?Skilled Therapeutic Interventions/Progress Updates:  ? Received pt sitting in recliner, pt agreeable to PT treatment, and denied any pain during session. Session with emphasis on discharge planning, functional mobility/transfers, generalized strengthening and endurance, dynamic standing balance/coordination, toileting, stair navigation, and gait training. Donned ted hose and shoes with total A for time management and pt requested to go to sink and brush teeth. Pt performed all transfers without AD and CGA/close supervision. Pt ambulated to sink and stood and washed face/combed hair with supervision. Pt ambulated in/out of bathroom without AD and CGA and stood to void with close supervision (noted urinary hesitancy). Pt sat in chair and brushed teeth with supervision. Pt then ambulated >110ft without AD and CGA (occasional light min A to correct balance when turning). Took seated rest break then ambulated 73ft to stairwell and navigated 5 steps x 2 trials with L handrail and CGA/light min A using a lateral stepping technique to simulate home entry. Pt required cues to ensure foot was completley on step and for stepping pattern. Ambulated 121ft without AD and CGA back to room with 1 large LOB to L when turning into room - pt actually falling onto wall requiring mod A to correct. Concluded session with pt sitting in recliner, needs within reach, and seatbelt alarm on. NT present at bedside changing linens and checking vitals.  ? ?Therapy Documentation ?Precautions:  ?Precautions ?Precautions: Fall ?Restrictions ?Weight Bearing Restrictions: No ? ?Therapy/Group: Individual Therapy ?Blenda Nicely ?Becky Sax PT,  DPT  ?08/21/2021, 7:26 AM  ?

## 2021-08-21 NOTE — Progress Notes (Addendum)
Contacted pt's son via phone this am. Message left requesting a return call to discuss pt's out-pt HD schedule and to hopefully provide son schedule letter this afternoon. Devlin await son return call.  ? ?Melven Sartorius ?Renal Navigator ?[phone] ? ?Addendum at 2:00 pm: ?Met with pt, pt's son, and pt's ex-wife at bedside. Discussed pt's out-pt HD schedule. Pt accepted at [facility] on TTS. Pt Vincent start on Tuesday. Pt Thane need to arrive at 9:30 to complete paperwork prior to 10:20 chair time. After first appt, pt Jibran need to arrive at 10:00 for 10:20 chair time. Pt and family voice understanding of the above details. Schedule letter provided to family and appt details added to AVS as well. Clinic advised earlier this week that pt Rafan start on Tuesday. Contacted renal PA regarding clinic's need for orders at d/c.  ? ?
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
## 2021-08-21 NOTE — Progress Notes (Signed)
Physical Therapy Discharge Summary ? ?Patient Details  ?Name: [name]. ?MRN: [id number] ?Date of Birth: 22-Jun-1950 ? ?Today's Date: 08/22/2021 ?PT Individual Time: 6160-7371 ?PT Individual Time Calculation (min): 64 min  ? ?Patient has met 9 of 9 long term goals due to improved activity tolerance, improved balance, increased strength, ability to compensate for deficits, improved awareness, and improved coordination. Patient to discharge at an ambulatory level Supervision using RW. Patient's care partner is independent to provide the necessary physical and cognitive assistance at discharge. Plan for pt's ex, [name], to provide supervision while pt's son is away at work. Pt's family attended family education training on 4/7. ? ?All goals met. ? ?Recommendation:  ?[name] benefit from ongoing skilled PT services in outpatient setting to continue to advance safe functional mobility, address ongoing impairments in generalized strengthening and endurance, dynamic standing balance/coordination, gait training, NMR, and to minimize fall risk. ? ?Equipment: ?RW ? ?Reasons for discharge: treatment goals met and discharge from hospital ? ?Patient/family agrees with progress made and goals achieved: Yes ? ?Skilled Therapeutic Interventions/Progress Updates:  ?Pt received sitting in recliner with nurse present for morning medications. Pt agreeable to therapy session. Pt emotional this morning asking "Laken I ever be the same again" and asking nurse about dialysis. Therapist provides emotional support as well and educates pt on his mobility level. Rehab NP in/out for morning assessment and dialysis PA in/out for assessment. Pt asking rehab NP about sex after hospitalization - therapist provided pt a hand-out on sex after TBI (even though pt has had an anoxic BI) to help start conversations with provider. Sit<>stands using RW mod-I during session. Gait training ~111f to main therapy gym using RW with supervision for safety  - achieves reciprocal stepping pattern. Stair navigation ascending/descending 4 steps (6" height) x2 using B UE support on L HR only to replicate home set-up with supervision for safety. [facility] demonstrates increased fall risk as noted by score of 35/56; however, this is a significant improvement compared to 19/56 on 08/15/21.  (<36= high risk for falls, close to 100%; 37-45 significant >80%; 46-51 moderate >50%; 52-55 lower >25%). Gait training ~1521fback to room using RW with supervision - pt demos increased fatigue at this time tending to push down through RW more, frequent cuing for upright posture. At end of session, pt left seated in recliner with needs in reach and seat belt alarm on. ? ? ? ?PT Discharge ?Precautions/Restrictions ?Precautions ?Precautions: Fall ?Restrictions ?Weight Bearing Restrictions: No ?Pain Interference ?Pain Interference ?Pain Effect on Sleep: 4. Almost constantly ?Pain Interference with Therapy Activities: 0. Does not apply - I have not received rehabilitationtherapy in the past 5 days ?Pain Interference with Day-to-Day Activities: 1. Rarely or not at all ?Vision/Perception  ?Vision - History ?Ability to See in Adequate Light: 0 Adequate ?Perception ?Perception: Within Functional Limits ?Praxis ?Praxis: Intact  ?Cognition ?Overall Cognitive Status: Impaired/Different from baseline ?Arousal/Alertness: Awake/alert ?Orientation Level: Oriented X4 ?Memory: Impaired ?Awareness: Impaired ?Problem Solving: Impaired ?Safety/Judgment: Impaired ?Comments: decreased insight into deficits ?Sensation ?Sensation ?Light Touch: Appears Intact ?Proprioception: Appears Intact ?Coordination ?Gross Motor Movements are Fluid and Coordinated: No ?Fine Motor Movements are Fluid and Coordinated: No ?Coordination and Movement Description: generalized weakness ?Finger Nose Finger Test: slow and mild dysmetria ?Heel Shin Test: slow, decreased ROM, and impaired  coordination ?Motor  ?Motor ?Motor: Abnormal postural alignment and control ?Motor - Skilled Clinical Observations: generalized weakness, decreased balance/postural control, delayed corrective reactions  ?Mobility ?Bed Mobility ?Bed  Mobility: Supine to Sit;Sit to Supine ?Supine to Sit: Independent with assistive device ?Sit to Supine: Independent with assistive device ?Transfers ?Transfers: Sit to Stand;Stand Pivot Transfers;Stand to Sit ?Sit to Stand: Independent with assistive device ?Stand to Sit: Independent with assistive device ?Stand Pivot Transfers: Independent with assistive device ?Transfer (Assistive device): Rolling walker ?Locomotion  ?Gait ?Ambulation: Yes ?Gait Assistance: Supervision/Verbal cueing ?Gait Distance (Feet): 225 Feet ?Assistive device: Rolling walker ?Gait Assistance Details: Verbal cues for safe use of DME/AE;Verbal cues for precautions/safety ?Gait ?Gait: Yes ?Gait Pattern: Impaired ?Gait Pattern: Step-through pattern;Trunk flexed ?Gait velocity: decreased ?Stairs / Additional Locomotion ?Stairs: Yes ?Stairs Assistance: Supervision/Verbal cueing ?Stair Management Technique: One rail Left;Step to pattern;Sideways;Forwards ?Number of Stairs: 8 ?Height of Stairs: 6 ?Ramp: Supervision/Verbal cueing (using RW) ?Curb: Supervision/Verbal cueing (using RW) ?Wheelchair Mobility ?Wheelchair Mobility: No  ?Trunk/Postural Assessment  ?Cervical Assessment ?Cervical Assessment: Within Functional Limits ?Thoracic Assessment ?Thoracic Assessment: Within Functional Limits ?Lumbar Assessment ?Lumbar Assessment: Within Functional Limits ?Postural Control ?Postural Control: Deficits on evaluation ?Righting Reactions: delayed and inadequate  ?Balance ?Balance ?Balance Assessed: Yes ?Standardized Balance Assessment ?Standardized Balance Assessment: Merrilee Jansky Balance Test ?Merrilee Jansky Balance Test ?Sit to Stand: Able to stand  independently using hands ?Standing Unsupported: Able to stand 2 minutes with  supervision ?Sitting with Back Unsupported but Feet Supported on Floor or Stool: Able to sit safely and securely 2 minutes ?Stand to Sit: Controls descent by using hands ?Transfers: Able to transfer safely, minor use of hands ?Standing Unsupported with Eyes Closed: Able to stand 10 seconds with supervision (mild/moderate anterior/posterior sway) ?Standing Ubsupported with Feet Together: Able to place feet together independently and stand for 1 minute with supervision (mild postural sway) ?From Standing, Reach Forward with Outstretched Arm: Can reach forward >5 cm safely (2") ?From Standing Position, Pick up Object from Floor: Able to pick up shoe, needs supervision ?From Standing Position, Turn to Look Behind Over each Shoulder: Turn sideways only but maintains balance ?Turn 360 Degrees: Needs close supervision or verbal cueing ?Standing Unsupported, Alternately Place Feet on Step/Stool: Able to complete >2 steps/needs minimal assist ?Standing Unsupported, One Foot in Front: Able to take small step independently and hold 30 seconds ?Standing on One Leg: Tries to lift leg/unable to hold 3 seconds but remains standing independently ?Total Score: 35 ?Static Sitting Balance ?Static Sitting - Balance Support: Feet unsupported ?Static Sitting - Level of Assistance: 6: Modified independent (Device/Increase time) ?Dynamic Sitting Balance ?Dynamic Sitting - Balance Support: During functional activity ?Dynamic Sitting - Level of Assistance: 6: Modified independent (Device/Increase time) ?Static Standing Balance ?Static Standing - Balance Support: During functional activity;Bilateral upper extremity supported ?Static Standing - Level of Assistance: 6: Modified independent (Device/Increase time) ?Dynamic Standing Balance ?Dynamic Standing - Balance Support: During functional activity;Bilateral upper extremity supported ?Dynamic Standing - Level of Assistance: 5: Stand by assistance ?Extremity Assessment  ?RLE Assessment ?RLE  Assessment: Exceptions to [facility] ?RLE Strength ?Right Hip Flexion: 4/5 ?Right Hip ABduction: 4/5 ?Right Hip ADduction: 4/5 ?Right Knee Flexion: 4-/5 ?Right Knee Extension: 4/5 ?Right Ankle Dorsiflexion: 4-/5 ?Right Ankle Plantar Flexi

## 2021-08-21 NOTE — Progress Notes (Signed)
Speech Language Pathology Daily Session Note ? ?Patient Details  ?Name: [name]. ?MRN: [id number] ?Date of Birth: 1950/12/27 ? ?Today's Date: 08/21/2021 ?SLP Individual Time: 7998-7215 ?SLP Individual Time Calculation (min): 35 min ? ?Short Term Goals: ?Week 1: SLP Short Term Goal 1 (Week 1): STG = LTG d/t ELOS ? ?Skilled Therapeutic Interventions: Skilled treatment session focused on family education. SLP facilitated session by providing education regarding patient's current swallowing function, diet recommendations, appropriate textures, swallowing compensatory strategies, medication administration, the water protocol, and how to appropriately thicken liquids. All questions were answered and handouts were given to reinforce information. Patient left upright in recliner with alarm on and all needs within reach. Continue with current plan of care.  ?   ? ?Pain ?Pain Assessment ?Pain Scale: 0-10 ?Pain Score: 0-No pain ? ?Therapy/Group: Individual Therapy ? ?[name] ?08/21/2021, 3:49 PM ?

## 2021-08-21 NOTE — Progress Notes (Signed)
Speech Language Pathology Discharge Summary ? ?Patient Details  ?Name: [name]. ?MRN: [id number] ?Date of Birth: Nov 17, 1950 ? ?Today's Date: 08/22/2021 ?SLP Individual Time: 6979-4801 ?SLP Individual Time Calculation (min): 60 min ? ? ?Skilled Therapeutic Interventions:  Pt seen this date skilled ST intervention targeting dysphagia and cognitive-linguistic goals. Pt encountered awake/alert and sitting OOB in recliner chair. Pt agreeable to ST intervention. SLP provided handouts and verbal education re: appropriate solid food textures upon discharge, in addition to education on how to thicken liquids appropriately. Provided information on water protocol, which pt voiced he did not understand; left handout for family to review. Pt implemented safe swallow strategies during lunch this date, though required Mod A to perform intermittent throat clear within and outside of the context of PO intake. Cognitively, pt reports ongoing difficulty with recall, attention, and problem-solving. SLUMS re-administered with pt demonstrating 2 point improvement in score (11/30 on initial evaluation, 13/30 this date). Continue to recommend 24/7 supervision and assistance which was verbalized to pt. He endorses the need for this as well. Pt left in care of NT. Benito plan to discharge. Please see below for discharge summary. ? ?Patient has met 4 of 5 long term goals.  Patient to discharge at overall Supervision level.  ? ?Reasons goals not met: Patient's swallowing function has improved but patient remains on Dys. 2 textures with nectar-thick liqudis with the water protocol  ? ?Clinical Impression/Discharge Summary: Patient has made functional gains and has met 4 of 5 LTGs this admission. Patient has made significant cognitive gains and requires overall supervision level verbal cues to complete functional and familiar tasks safely in regards to problem solving, recall and awareness. Patient also demonstrates improved use of  swallowing compensatory strategies but continues to demonstrate trace, silent aspiration with thin liquids and mild pharyngeal residuals with solids and liquids per most recent MBS on 08/20/21. Patient is currently consuming Dys. 2 textures with nectar-thick liquids with minimal overt s/s of aspiration and is consuming water per the water protocol in between meals. Patient and family education is complete and patient Zebediah discharge home with 24 hour supervision from family. Patient would benefit from f/u SLP services to maximize his cognitive and swallowing function in order to reduce caregiver burden.  ? ?Care Partner:  ?Caregiver Able to Provide Assistance: Yes  ?Type of Caregiver Assistance: Physical;Cognitive ? ?Recommendation:  ?24 hour supervision/assistance;Outpatient SLP  ?Rationale for SLP Follow Up: Reduce caregiver burden;Maximize cognitive function and independence;Maximize swallowing safety  ? ?Equipment: N/A  ? ?Reasons for discharge: Discharged from hospital  ? ?Patient/Family Agrees with Progress Made and Goals Achieved: Yes  ? ? ?PAYNE, COURTNEY ?08/21/2021, 3:53 PM ? ?

## 2021-08-21 NOTE — Progress Notes (Signed)
Physical Therapy Session Note ? ?Patient Details  ?Name: [name]. ?MRN: [id number] ?Date of Birth: July 25, 1950 ? ?Today's Date: 08/21/2021 ?PT Individual Time: 1435-1530 ?PT Individual Time Calculation (min): 55 min  ? ?Short Term Goals: ?Week 1:  PT Short Term Goal 1 (Week 1): STG= LTG based on ELOS ? ?Skilled Therapeutic Interventions/Progress Updates: Pt presented in recliner with ex-wife and son present agreeable to therapy. Session to focus on hands on family education. Pt denies pain at start of session. Rest breaks provided due to fatigue as needed. PTA explained that many PT session have been performed with AD however at home pt instructed to use RW for safety. PTA then had pt ambulate to day room and participate in obstacle course stepping over thresholds and weaving through cones to demonstrate decreased safety when performed without AD. Pt was CGA however required cues to increase BOS, decrease BLE crossing during turns, and general safety. Family was able to observe and verbalized understanding. Pt then ambulated to rehab gym and practiced ascending/descending stairs 2 x 4 with L rail only. Pt required verbal cues and only required CGA so hold pants up. Pt ascended with step through pattern but step to pattern on descent. Pt was also able to perform with son who was able to provide appropriate cues as needed. [name] concerned due to walkway to home gravel. Discussed having someone present intially to supervise when going to HD transport or leaving home. PTA then obtained RW and pt ambulated to ortho gym to demosntrate car transfer (mod I), ascend/descend ramp with RW (supervision) and gait on mulch. Son was able to supervise/guard pt on much and ramp as well. Pt then ambulated back to room with son guarding pt (primarily to hold pants up). Pt returned to recliner at end of session with family pleased with pt's progress, all current questions answered, belt alarm on, and current needs met.  ?    ? ?Therapy Documentation ?Precautions:  ?Precautions ?Precautions: Fall ?Precaution Comments: HD port ?Restrictions ?Weight Bearing Restrictions: No ?General: ?  ?Vital Signs: ?Therapy Vitals ?Temp: 97.8 ?F (36.6 ?C) ?Pulse Rate: 76 ?Resp: 17 ?BP: (!) 144/75 ?Patient Position (if appropriate): Sitting ?Oxygen Therapy ?SpO2: 93 % ?O2 Device: Room Air ?Pain: ?Pain Assessment ?Pain Scale: 0-10 ?Pain Score: 0-No pain ?Mobility: ?Bed Mobility ?Bed Mobility: Supine to Sit;Sit to Supine ?Supine to Sit: Independent with assistive device ?Sit to Supine: Independent with assistive device ?Transfers ?Transfers: Sit to Stand;Stand Pivot Transfers;Stand to Sit ?Sit to Stand: Independent with assistive device ?Stand to Sit: Independent with assistive device ?Stand Pivot Transfers: Independent with assistive device ?Transfer (Assistive device): Rolling walker ?Locomotion : ?   ?Trunk/Postural Assessment : ?Cervical Assessment ?Cervical Assessment: Within Functional Limits ?Thoracic Assessment ?Thoracic Assessment: Within Functional Limits ?Lumbar Assessment ?Lumbar Assessment: Within Functional Limits ?Postural Control ?Postural Control: Deficits on evaluation ?Righting Reactions: delayed and inadequate  ?Balance: ?Balance ?Balance Assessed: Yes ?Static Sitting Balance ?Static Sitting - Balance Support: Feet supported ?Static Sitting - Level of Assistance: 6: Modified independent (Device/Increase time) ?Dynamic Sitting Balance ?Dynamic Sitting - Balance Support: During functional activity ?Dynamic Sitting - Level of Assistance: 6: Modified independent (Device/Increase time) ?Static Standing Balance ?Static Standing - Level of Assistance: 6: Modified independent (Device/Increase time) ?Dynamic Standing Balance ?Dynamic Standing - Balance Support: During functional activity;Bilateral upper extremity supported ?Dynamic Standing - Level of Assistance: 6: Modified independent (Device/Increase time) ?Exercises: ?  ?Other Treatments:    ? ? ? ?Therapy/Group: Individual Therapy ? ?Averi Cacioppo ?08/21/2021, 3:55 PM  ?
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
## 2021-08-21 NOTE — Progress Notes (Signed)
Occupational Therapy Session Note ? ?Patient Details  ?Name: [name]. ?MRN: [id number] ?Date of Birth: [birth year] ? ?Today's Date: 08/21/2021 ?OT Individual Time: 6924-9324 ?OT Individual Time Calculation (min): 43 min  ? ? ?Short Term Goals: ?Week 1:  OT Short Term Goal 1 (Week 1): STG= LTG d/t ELOS ? ?Skilled Therapeutic Interventions/Progress Updates:  ?  Family education session completed with pt and his son and mother. Verbal education provided re fall risk reduction, energy conservation strategies, home carryover of transfer training, ADLs, and IADLs. Demonstration and hands on training completed for pt performance of UB/LB bathing and dressing at (S) level, toileting hygiene and transfers, and shower transfers. Pt completed 200+ ft of functional mobility with the RW at (S) level. His son was able to demonstrate safe handling techniques and instructed on body mechanics. Pt completed a tub transfer using the TTB with (S). His son [name] once he is cleared to shower following HD port removal and transfer to fistula use. Pt was left sitting up in the recliner with all needs met, family present supervising, and call bell within reach.  ? ? ? ?Therapy Documentation ?Precautions:  ?Precautions ?Precautions: Fall ?Restrictions ?Weight Bearing Restrictions: No ? ? ?Therapy/Group: Individual Therapy ? ?[name] ?08/21/2021, 6:27 AM ?
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
## 2021-08-21 NOTE — Progress Notes (Signed)
Occupational Therapy Discharge Summary ? ?Patient Details  ?Name: [name]. ?MRN: [id number] ?Date of Birth: 1950/08/05 ? ?Patient has met 6 of 6 long term goals due to improved activity tolerance, improved balance, postural control, ability to compensate for deficits, improved attention, improved awareness, and improved coordination.  Patient to discharge at overall mod I to Supervision level.  Patient's care partner is independent to provide the necessary cognitive assistance at discharge.  Family education has been completed with his ex-wife and son. [name] has made excellent progress in CIR and [name] continue to progress nicely at home.  ? ?Reasons goals not met: All goals met.  ? ?Recommendation:  ?[name] benefit from ongoing skilled OT services in outpatient setting to continue to advance functional skills in the area of BADL and iADL. ? ?Equipment: ?RW ? ?Reasons for discharge: treatment goals met and discharge from hospital ? ?Patient/family agrees with progress made and goals achieved: Yes ? ?OT Discharge ?Precautions/Restrictions  ?Precautions ?Precautions: Fall ?Precaution Comments: HD port ?Restrictions ?Weight Bearing Restrictions: No ?Pain ?Pain Assessment ?Pain Scale: 0-10 ?Pain Score: 0-No pain ?ADL ?ADL ?Eating: Supervision/safety ?Where Assessed-Eating: Edge of bed ?Grooming: Modified independent ?Where Assessed-Grooming: Standing at sink ?Upper Body Bathing: Modified independent ?Where Assessed-Upper Body Bathing: Standing at sink ?Lower Body Bathing: Modified independent ?Where Assessed-Lower Body Bathing: Sitting at sink, Standing at sink ?Upper Body Dressing: Modified independent (Device) ?Where Assessed-Upper Body Dressing: Sitting at sink ?Lower Body Dressing: Modified independent ?Where Assessed-Lower Body Dressing: Sitting at sink ?Toileting: Modified independent ?Where Assessed-Toileting: Toilet ?Toilet Transfer: Modified independent ?Toilet Transfer Method: Ambulating ?Curator: Grab bars ?Tub/Shower Transfer: Unable to assess ?Vision ?Baseline Vision/History: 0 No visual deficits ?Patient Visual Report: No change from baseline ?Vision Assessment?: No apparent visual deficits ?Perception  ?Perception: Within Functional Limits ?Praxis ?Praxis: Intact ?Cognition ?Cognition ?Overall Cognitive Status: Impaired/Different from baseline ?Arousal/Alertness: Awake/alert ?Orientation Level: Person;Place;Situation ?Person: Oriented ?Place: Oriented ?Situation: Oriented ?Memory: Impaired ?Memory Impairment: Storage deficit;Retrieval deficit;Decreased short term memory ?Decreased Short Term Memory: Verbal basic;Functional basic ?Attention: Selective ?Selective Attention: Impaired ?Selective Attention Impairment: Verbal complex;Functional complex ?Awareness: Impaired ?Awareness Impairment: Anticipatory impairment ?Problem Solving: Impaired ?Safety/Judgment: Impaired ?Comments: decreased insight into deficits ?Brief Interview for Mental Status (BIMS) ?Repetition of Three Words (First Attempt): 3 ?Temporal Orientation: Year: Correct ?Temporal Orientation: Month: Accurate within 5 days ?Temporal Orientation: Day: Correct ?Recall: "Sock": No, could not recall ?Recall: "Blue": No, could not recall ?Recall: "Bed": No, could not recall ?BIMS Summary Score: 9 ?Sensation ?Sensation ?Light Touch: Appears Intact ?Hot/Cold: Appears Intact ?Proprioception: Appears Intact ?Stereognosis: Appears Intact ?Coordination ?Gross Motor Movements are Fluid and Coordinated: No ?Fine Motor Movements are Fluid and Coordinated: No ?Coordination and Movement Description: generalized weakness ?Finger Nose Finger Test: slow and mild dysmetria ?Heel Shin Test: slow, decreased ROM, and impaired coordination ?Motor  ?Motor ?Motor: Abnormal postural alignment and control ?Motor - Skilled Clinical Observations: generalized weakness, decreased balance/postural control, delayed corrective reactions ?Mobility  ?Bed  Mobility ?Bed Mobility: Supine to Sit;Sit to Supine ?Supine to Sit: Independent with assistive device ?Sit to Supine: Independent with assistive device ?Transfers ?Sit to Stand: Independent with assistive device ?Stand to Sit: Independent with assistive device  ?Trunk/Postural Assessment  ?Cervical Assessment ?Cervical Assessment: Within Functional Limits ?Thoracic Assessment ?Thoracic Assessment: Within Functional Limits ?Lumbar Assessment ?Lumbar Assessment: Within Functional Limits ?Postural Control ?Postural Control: Deficits on evaluation ?Righting Reactions: delayed and inadequate  ?Balance ?Balance ?Balance Assessed: Yes ?Static Sitting Balance ?Static Sitting - Balance Support: Feet supported ?Static Sitting - Level of  Assistance: 6: Modified independent (Device/Increase time) ?Dynamic Sitting Balance ?Dynamic Sitting - Balance Support: During functional activity ?Dynamic Sitting - Level of Assistance: 6: Modified independent (Device/Increase time) ?Static Standing Balance ?Static Standing - Level of Assistance: 6: Modified independent (Device/Increase time) ?Dynamic Standing Balance ?Dynamic Standing - Balance Support: During functional activity;Bilateral upper extremity supported ?Dynamic Standing - Level of Assistance: 6: Modified independent (Device/Increase time) ?Extremity/Trunk Assessment ?RUE Assessment ?RUE Assessment: Within Functional Limits ?LUE Assessment ?LUE Assessment: Within Functional Limits ? ? ?[name] Sites ?08/21/2021, 2:36 PM ?
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
## 2021-08-21 NOTE — Progress Notes (Signed)
Inpatient Rehabilitation Discharge Medication Review by a Pharmacist ? ?A complete drug regimen review was completed for this patient to identify any potential clinically significant medication issues. ? ?High Risk Drug Classes Is patient taking? Indication by Medication  ?Antipsychotic No   ?Anticoagulant Yes Apixaban- AF  ?Antibiotic No   ?Opioid Yes Perccoet- acute pain  ?Antiplatelet No   ?Hypoglycemics/insulin Yes Lantus- T2DM  ?Vasoactive Medication Yes Amiodarone- rate control  ?Chemotherapy No   ?Other No   ? ? ? ?Type of Medication Issue Identified Description of Issue Recommendation(s)  ?Drug Interaction(s) (clinically significant) ?    ?Duplicate Therapy ?    ?Allergy ?    ?No Medication Administration End Date ?    ?Incorrect Dose ?    ?Additional Drug Therapy Needed ?    ?Significant med changes from prior encounter (inform family/care partners about these prior to discharge).    ?Other ?    ? ? ?Clinically significant medication issues were identified that warrant physician communication and completion of prescribed/recommended actions by midnight of the next day:  No ? ?Time spent performing this drug regimen review (minutes):  30 ? ? ?Booker Bhatnagar BS, PharmD, BCPS ?Clinical Pharmacist ?08/21/2021 1:17 PM ? ?

## 2021-08-22 LAB — GLUCOSE, CAPILLARY
Glucose-Capillary: 94 mg/dL (ref 70–99)
Glucose-Capillary: 127 mg/dL — ABNORMAL HIGH (ref 70–99)
Glucose-Capillary: 140 mg/dL — ABNORMAL HIGH (ref 70–99)

## 2021-08-22 MED ORDER — HEPARIN SODIUM (PORCINE) 1000 UNIT/ML IJ SOLN
INTRAMUSCULAR | Status: AC
  Filled 2021-08-22: qty 4

## 2021-08-22 NOTE — Progress Notes (Signed)
Occupational Therapy Session Note ? ?Patient Details  ?Name: [name]. ?MRN: [id number] ?Date of Birth: April 03, 1951 ? ?Today's Date: 08/22/2021 ?OT Individual Time: 6295-2841 ?OT Individual Time Calculation (min): 58 min  ? ? ?Short Term Goals: ?Week 1:  OT Short Term Goal 1 (Week 1): STG= LTG d/t ELOS ? ?Skilled Therapeutic Interventions/Progress Updates:  ?   ?Pt received in recliner with mild sternum pain, but no intervention requested. Rest provided PRN ?Pt declines BADL intervention this date stating he already washed up this morning. All functional transfers completed with MOD I using RW ? ?Therapeutic exercise ?3x10 sit to stand regular and staggered stance for BLE strengthening required for BADLs and functional transfers ?MOD I for regular stance, CGA/cuing for staggered stance for emphasis on weight shift forward. No balance issues laterally in staggered stance ? ?93mn on nustep per pt requested activity for endurance training on level 4 resistance with preferred music- gospel. Pt tearful when praying/listening to music. Provided support and encouragement. ? ? ?Therapeutic activity ?Biodex limits of stability with poor weight shift from hips requiring MIN-MOD A to facilitate. Pt only moving head to make weight shifts.  ? ?Standing cone taps no AD for dynamic standing balance weight shifting and SL stability required for BADLs/IADLs and funtional transfers. Pt requires up to MIN A for balance during tast with lateral and anterior LOB. Pt with reactive stepping to LOB, however intermittently insufficient  ? ? ? ?Pt left at end of session in recliner with exit alarm on, call light in reach and all needs met ? ? ?Therapy Documentation ?Precautions:  ?Precautions ?Precautions: Fall ?Precaution Comments: HD port ?Restrictions ?Weight Bearing Restrictions: No ?General: ?  ? ? ? ?Therapy/Group: Individual Therapy ? ?SLowella DellSchlosser ?08/22/2021, 6:48 AM ?

## 2021-08-22 NOTE — Progress Notes (Addendum)
?                                                       PROGRESS NOTE ? ? ?Subjective/Complaints:  ? ?Patient is good mood today with no overnight complaints. Patient did share with me that he has concerns about "not being the same" since his hospitalization in terms of lower libido and concerns about sexual function following a brain injury event.  Patient expressed this is an important concern for him. ? ?ROS: Patient denies fever, sore throat, rash, changes in vision, N/V/D, cough, chest pain, shortness of breath, headache, joint pain, or changes in mood. ? ?Objective: ?  ?No results found. ?Recent Labs  ?  08/20/21 ?0500  ?WBC 3.4*  ?HGB 7.2*  ?HCT 23.6*  ?PLT 197  ? ?Recent Labs  ?  08/20/21 ?0500  ?NA 134*  ?K 4.4  ?CL 101  ?CO2 26  ?GLUCOSE 116*  ?BUN 46*  ?CREATININE 5.46*  ?CALCIUM 7.5*  ? ? ?Intake/Output Summary (Last 24 hours) at 08/22/2021 1414 ?Last data filed at 08/22/2021 1319 ?Gross per 24 hour  ?Intake 576 ml  ?Output --  ?Net 576 ml  ?  ? ?  ? ?Physical Exam: ?Vital Signs ?Blood pressure 118/64, pulse 76, temperature 97.9 ?F (36.6 ?C), temperature source Oral, resp. rate 18, height 5\' 9"  (1.753 m), weight 83.4 kg, SpO2 99 %. ? ? ?Constitutional: Not in acute distress. Vital signs reviewed and are stable. ?HEENT: NCAT, EOMI, moist oral membranes  ?Neck: supple ?Cardiovascular: RRR, absence of murmur. No JVD present. ?Respiratory/Chest: CTA bilateral, no wheezes or rales, effort normal. ?GI/Abdomen: BS + all four quadrants, non-distended, non-tender ?Ext: no edema, cyanosis, or clubbing ?Psych: pleasant, cooperative  ?Neurologic: Non-focal exam with CN II-XII intact, insight and awareness are fair.  Motor strength 4-5/5 BUE's and BLE's.  Sensory exam normal to light touch, all extremities.  Cerebellar function normal, no ataxia.   ?Musculoskeletal: Full ROM, no limitations of movement of trunk, neck, or extremities.  Posture normal.  No pain with AROM or PROM. ?Skin: intact. Graft site healing well and  closed. ?  ? ?Assessment/Plan: ?1. Functional deficits which require 3+ hours per day of interdisciplinary therapy in a comprehensive inpatient rehab setting. ?Physiatrist is providing close team supervision and 24 hour management of active medical problems listed below. ?Physiatrist and rehab team continue to assess barriers to discharge/monitor patient progress toward functional and medical goals ? ?Care Tool: ? ?Bathing ?   ?Body parts bathed by patient: Right arm, Left arm, Chest, Abdomen, Front perineal area, Buttocks, Right upper leg, Left upper leg, Face, Left lower leg, Right lower leg  ? Body parts bathed by helper: Right lower leg, Left lower leg ?  ?  ?Bathing assist Assist Level: Set up assist ?  ?  ?Upper Body Dressing/Undressing ?Upper body dressing   ?What is the patient wearing?: Pull over shirt ?   ?Upper body assist Assist Level: Independent with assistive device ?   ?Lower Body Dressing/Undressing ?Lower body dressing ? ? ?   ?What is the patient wearing?: Incontinence brief, Underwear/pull up ? ?  ? ?Lower body assist Assist for lower body dressing: Independent with assitive device ?   ? ?Toileting ?Toileting    ?Toileting assist Assist for toileting: Independent with assistive device ?  ?  ?Transfers ?  Chair/bed transfer ? ?Transfers assist ?   ? ?Chair/bed transfer assist level: Independent with assistive device ?Chair/bed transfer assistive device: Walker ?  ?Locomotion ?Ambulation ? ? ?Ambulation assist ? ?   ? ?Assist level: Supervision/Verbal cueing ?Assistive device: Walker-rolling ?Max distance: >134ft  ? ?Walk 10 feet activity ? ? ?Assist ?   ? ?Assist level: Supervision/Verbal cueing ?Assistive device: Walker-rolling  ? ?Walk 50 feet activity ? ? ?Assist   ? ?Assist level: Supervision/Verbal cueing ?Assistive device: Walker-rolling  ? ? ?Walk 150 feet activity ? ? ?Assist   ? ?Assist level: Supervision/Verbal cueing ?Assistive device: Walker-rolling ?  ? ?Walk 10 feet on uneven surface   ?activity ? ? ?Assist Walk 10 feet on uneven surfaces activity did not occur: Safety/medical concerns ? ? ?Assist level: Supervision/Verbal cueing ?Assistive device: Walker-rolling  ? ?Wheelchair ? ? ? ? ?Assist Is the patient using a wheelchair?: No ?  ?  ? ?  ?   ? ? ?Wheelchair 50 feet with 2 turns activity ? ? ? ?Assist ? ?  ?  ? ? ?   ? ?Wheelchair 150 feet activity  ? ? ? ?Assist ?   ? ? ?   ? ?Blood pressure 118/64, pulse 76, temperature 97.9 ?F (36.6 ?C), temperature source Oral, resp. rate 18, height 5\' 9"  (1.753 m), weight 83.4 kg, SpO2 99 %. ? ?Medical Problem List and Plan: ?1. 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            -patient may shower ?            -ELOS/Goals: 4/9, mod I to sup with PT, OT, and sup with SLP ? -Continue CIR therapies including PT, OT, and SLP  ?2.  Antithrombotics: ?-DVT/anticoagulation:  Mechanical:  resumed eilquis yesterday ?            -pt is ambulating 100+' in therapy ?            -antiplatelet therapy: n/a ?3. Pain Management: tylenol prn ?-muscle rub or heat/ice to chest wall prn, lidocaine patch pt requesting muscle relaxer , added methocarbamol  ?-4/6-7 lidocaine patches are very helpful for pain ?-4/8 Continued use of lidocaine patches, pain well managed. ?4. Mood: team to provide egosupport as necessary ?            -antipsychotic agents: n/a ? -neuropsych eval today ?4/8 Pt was a bit sad today, vocalized concerns about possible changes in sexual function s/p TBI.  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  Encouraged pt to have follow-up discussions with outpatient PM &R clinic provider as needed about concerns. ?5. Neuropsych: This patient is capable of making decisions on his own behalf. ?6. Skin/Wound Care: continue local care ?            -maximize nutritional intake ?7. Fluids/Electrolytes/Nutrition/dysphagia: pt cleared for D2/nectar diet by SLP 3/30 ?            -resumed diet as above   ?8. NICM w/diastolic CHF/PEA arrest: ?9.T2DM: Hgb A1c- 7.2. Monitor BS ac/hs and use SSI for elevated BS ?            --continue insulin Glargline 15 units daily ?CBG (last 3)  ?Recent Labs  ?  08/21/21 ?2053 08/22/21 ?8099 08/22/21 ?1205  ?GLUCAP 158* 94 127*  ?Reduce glargine to 10U ?4/7 cbg's still running low. Dc semglee today ?4/8 CBG's holding steady with semglee d/ced ? ?10. ESRD: NPO after midnight  Sunday for AVF on Monday ?            --HD TTS- per Nephro ?            -08/17/21 Left arm AVF graft done by Dr. [name] (Vascular Surgery) ?            -4/8 Dialysis today per nephrology     ?11. Anemia of chronic disease:  On iron for supplementation with Aranesp weekly. ?12. LLL PNA/MSSA bacteremia: To continue ancef thur April 4th. ?4/3 Continue Ancef for one additional day. ?4/4 Ancef completed. ?13.  NSVT/PAF: On amiodarone. Eliquis was hold pending procedure.  ?-4/4 Per discussion with Dr. Naaman Plummer, Eliquis resumed ?-Per consult with Oda Kilts, PA EP, no indication for Lifevest at this time. ?PEA is not a covered indication and we only use Lifevest as a bridge.  -monitor HR TID and for symptoms with activity.  ?14. Elevated TSH: Due to stress-->repeat labs in a month per recs.  ?  ? ?LOS: ?8 days ?A FACE TO FACE EVALUATION WAS PERFORMED ? ?Luetta Nutting ?08/22/2021, 2:14 PM  ? ? ? ? ?

## 2021-08-22 NOTE — Plan of Care (Signed)
?  Problem: RH Swallowing ?Goal: LTG [name] demonstrate functional change in swallow as evidenced by bedside/clinical objective assessment (SLP) ?Description: LTG: [name] demonstrate functional change in swallow as evidenced by bedside/clinical objective assessment (SLP) ?Outcome: Not Met (add Reason) ?Flowsheets (Taken 08/15/2021 1236 by [name], CCC-SLP) ?LTG: [name] demonstrate functional change in swallow as evidenced by bedside/clinical objective assessment: Oropharyngeal swallow ?Note: Pt remains on Dys 2/Nectar-Thick Liquid diet ? ? ?Problem: RH Swallowing ?Goal: LTG [name] consume least restrictive diet using compensatory strategies with assistance (SLP) ?Description: LTG:  [name] consume least restrictive diet using compensatory strategies with assistance (SLP) ?Outcome: Completed/Met ?Flowsheets (Taken 08/15/2021 1236 by [name], CCC-SLP) ?LTG: [name] consume least restrictive diet using compensatory strategies with assistance of (SLP): Supervision ?  ?Problem: RH Problem Solving ?Goal: LTG [name] demonstrate problem solving for (SLP) ?Description: LTG:  [name] demonstrate problem solving for basic/complex daily situations with cues  (SLP) ?Outcome: Completed/Met ?Flowsheets (Taken 08/15/2021 1236 by [name], CCC-SLP) ?LTG: [name] demonstrate problem solving for (SLP): Basic daily situations ?LTG [name] demonstrate problem solving for: Supervision ?  ?Problem: RH Memory ?Goal: LTG [name] demonstrate ability for day to day (SLP) ?Description: LTG:   [name] demonstrate ability for day to day recall/carryover during cognitive/linguistic activities with assist  (SLP) ?Outcome: Completed/Met ?Flowsheets ?Taken 08/22/2021 1531 by Helaine Chess A, CCC-SLP ?LTG: [name] demonstrate ability for day to day recall/carryover during cognitive/linguistic activities with assist (SLP): Supervision ?Taken 08/15/2021 1236 by [name], CCC-SLP ?LTG: [name] demonstrate ability for day to day recall: New information ?Goal: LTG [name] use memory compensatory aids to (SLP) ?Description: LTG:  [name] use memory compensatory aids to recall biographical/new, daily complex information with cues (SLP) ?Outcome: Completed/Met ?Flowsheets (Taken 08/15/2021 1236 by [name], CCC-SLP) ?LTG: [name] use memory compensatory aids to (SLP): Supervision ?  ?Problem: RH Awareness ?Goal: LTG: [name] demonstrate awareness during functional activites type of (SLP) ?Description: LTG: [name] demonstrate awareness during functional activites type of (SLP) ?Outcome: Completed/Met ?Flowsheets ?Taken 08/22/2021 1531 by Helaine Chess A, CCC-SLP ?[name] demonstrate during cognitive/linguistic activities awareness type of: Intellectual ?Taken 08/15/2021 1236 by [name], CCC-SLP ?LTG: [name] demonstrate awareness during cognitive/linguistic activities with assistance of (SLP): Supervision ?  ?  ?

## 2021-08-22 NOTE — Progress Notes (Signed)
?[facility] ?Progress Note  ? ?Subjective:    ?Seen in room. Feels well. No complaints. For dialysis today, states going home tomorrow.  ? ?Objective ?Vitals:  ? 08/21/21 0437 08/21/21 1339 08/21/21 1940 08/22/21 0420  ?BP: 121/75 (!) 144/75 118/74 118/69  ?Pulse: 80 76 76 74  ?Resp: 18 17 18 16   ?Temp: 98.6 ?F (37 ?C) 97.8 ?F (36.6 ?C) 97.8 ?F (36.6 ?C) 98.1 ?F (36.7 ?C)  ?TempSrc:      ?SpO2: 97% 93% 98% 97%  ?Weight: 82.4 kg   83.4 kg  ?Height:      ? ?Physical Exam ?General: Older male; awake and alert; NAD ?Heart:S1 and S2; No murmurs, gallops, or rubs ?Lungs: Clear anteriorly and laterally ?Abdomen: Soft and non-tender ?Extremities: no LE edema  ?Dialysis Access: [facility]; s/p L AVF 08/17/21 B/T ? ?Filed Weights  ? 08/20/21 1730 08/21/21 0437 08/22/21 0420  ?Weight: 79.5 kg 82.4 kg 83.4 kg  ? ? ?Intake/Output Summary (Last 24 hours) at 08/22/2021 1310 ?Last data filed at 08/22/2021 7342 ?Gross per 24 hour  ?Intake 453 ml  ?Output --  ?Net 453 ml  ? ? ? ?Additional Objective ?Labs: ?Basic Metabolic Panel: ?Recent Labs  ?Lab 08/17/21 ?1158 08/18/21 ?1228 08/20/21 ?0500  ?NA 136 137 134*  ?K 4.2 4.6 4.4  ?CL 98 99 101  ?CO2  --  25 26  ?GLUCOSE 84 254* 116*  ?BUN 52* 78* 46*  ?CREATININE 5.80* 6.26* 5.46*  ?CALCIUM  --  7.8* 7.5*  ?PHOS  --  6.6* 4.6  ? ? ?Liver Function Tests: ?Recent Labs  ?Lab 08/18/21 ?1228 08/20/21 ?0500  ?ALBUMIN 2.0* 1.8*  ? ? ?No results for input(s): LIPASE, AMYLASE in the last 168 hours. ?CBC: ?Recent Labs  ?Lab 08/17/21 ?1158 08/18/21 ?1230 08/20/21 ?0500  ?WBC  --  5.0 3.4*  ?NEUTROABS  --   --  1.7  ?HGB 9.9* 7.4* 7.2*  ?HCT 29.0* 24.9* 23.6*  ?MCV  --  91.9 90.4  ?PLT  --  211 197  ? ? ?Blood Culture ?   ?Component Value Date/Time  ? SDES BLOOD RIGHT HAND 08/02/2021 0536  ? SDES BLOOD LEFT HAND 08/02/2021 0536  ? SPECREQUEST AEROBIC BOTTLE ONLY Blood Culture adequate volume 08/02/2021 0536  ? SPECREQUEST AEROBIC BOTTLE ONLY Blood Culture adequate volume 08/02/2021 0536  ? CULT   08/02/2021 0536  ?  NO GROWTH 5 DAYS ?Performed at [facility], [place] [address], [place], [place] [address] ?  ? CULT  08/02/2021 0536  ?  NO GROWTH 5 DAYS ?Performed at [facility], [place] [address] [place], [place] [address] ?  ? REPTSTATUS 08/07/2021 FINAL 08/02/2021 0536  ? REPTSTATUS 08/07/2021 FINAL 08/02/2021 0536  ? ? ?Cardiac Enzymes: ?No results for input(s): CKTOTAL, CKMB, CKMBINDEX, TROPONINI in the last 168 hours. ?CBG: ?Recent Labs  ?Lab 08/21/21 ?1152 08/21/21 ?1657 08/21/21 ?2053 08/22/21 ?2035 08/22/21 ?1205  ?GLUCAP 107* 147* 158* 94 127*  ? ? ?Iron Studies: No results for input(s): IRON, TIBC, TRANSFERRIN, FERRITIN in the last 72 hours. ?Lab Results  ?Component Value Date  ? INR 1.5 (H) 07/27/2021  ? ?Studies/Results: ?No results found. ? ?Medications: ? ferric gluconate (FERRLECIT) IVPB Stopped (08/20/21 1842)  ? ? acetaminophen  650 mg Oral TID  ? amiodarone  200 mg Oral BID  ? apixaban  5 mg Oral BID  ? Chlorhexidine Gluconate Cloth  6 each Topical BID  ? darbepoetin (ARANESP) injection - DIALYSIS  200 mcg Intravenous  Q Tue-HD  ? feeding supplement (NEPRO CARB STEADY)  237 mL Oral BID WC  ? guaiFENesin  600 mg Oral BID  ? insulin aspart  0-6 Units Subcutaneous TID WC  ? lidocaine  3 patch Transdermal Q24H  ? mouth rinse  15 mL Mouth Rinse BID  ? multivitamin  1 tablet Oral QHS  ? ? ?Dialysis Orders: ?New Start-schedule to start at [facility] TTS ? ?Assessment/Plan: ?1. Renal-  Originally admitted AKI on chronic renal failure 2nd ATN with cardiac arrest. First HD 3/22, HD now on TTS schedule here via [facility] Reviewed labs: noted GFR ranging in 20s since late 2022. SrCr currently not improving. 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 HD today  ?- also has OP spot assigned -  [facility] TTS.  ?- S/P L AVF placement today by Dr. [name] ?2. S/p cardiac arrest  Afib-  Cardiology consulted and plan to follow  outpatient.  ?3. Anemia-  hgb 7.2  trending down.  ESA given 4/4. IV  iron ordered as well-  transfuse as needed ?4. MSSA bact-  found on 3/17-  completed ancef course on 4/2 ?4. Secondary hyperparathyroidism-  PTH 147- no meds-  phos now 4.6- no binder for now ?5. HTN/volume-  BP and volume controlled-  no BP meds-  minimal UF with HD required because non oliguric. Euvolemic on exam.  ? ?[name] PA-C ?[facility] ?08/22/2021,1:10 PM ? ?

## 2021-08-23 LAB — GLUCOSE, CAPILLARY
Glucose-Capillary: 154 mg/dL — ABNORMAL HIGH (ref 70–99)
Glucose-Capillary: 128 mg/dL — ABNORMAL HIGH (ref 70–99)
Glucose-Capillary: 155 mg/dL — ABNORMAL HIGH (ref 70–99)

## 2021-08-23 NOTE — Progress Notes (Signed)
Returned from HD around 0040. Requesting assistance to BR to have BM. "I feel like I've got to go." Offered dulcolax supp., patient wants after breakfast. LBM-4/4. PRN trazodone 50mg 's given at 0108. NSL removed, leaking when flushed. Left AVG ++, site maturing, not using at this time. Patrici Ranks A  ?

## 2021-08-23 NOTE — Progress Notes (Signed)
PRN dulcolax supp. Given at 0600 with moderate results.[name] A  ?

## 2021-08-23 NOTE — Progress Notes (Signed)
Per report patient went to HD late and did not get scheduled Ferrlicit IV ?

## 2021-08-23 NOTE — Progress Notes (Signed)
INPATIENT REHABILITATION DISCHARGE NOTE ? ? ?Discharge instructions by:Glenis Smoker RN ? ?Verbalized understanding:son ? ?Skin care/Wound care healing?incision arm ?dermabond ?Pain: ?none ?IV's:none ? ?Tubes/Drains:HD cath ? ?O2: ?none ?Safety instructions:sone ? ?Patient belongings: ?done ?Discharged BE:EFEO ? ?Discharged [id number] with NT  ? ?Notes:Insulin pen instruction done; return demo done by son; Renal diet discussed with son; copy provided; no questions noted; medications from pharmacy given to son; copy in chart. ? ? ? ? ? ? ?  ?

## 2021-08-23 NOTE — Progress Notes (Signed)
Report given to HD RN; Reminded HD RN re: Ferrlicit IV scheduled today. ?

## 2021-08-23 NOTE — Progress Notes (Signed)
?                                                       PROGRESS NOTE ? ? ?Subjective/Complaints:  ? ?Patient is good mood today and is ready for discharge today. He has had some constipation today and has had a dulcolax suppository this am. ? ?ROS: Patient denies fever, rash, sore throat, changes in vision, N/V/D, cough, chest pain, shortness of breath, headache, joint pain, or changes in mood. ? ?Objective: ?  ?No results found. ?No results for input(s): WBC, HGB, HCT, PLT in the last 72 hours. ? ?No results for input(s): NA, K, CL, CO2, GLUCOSE, BUN, CREATININE, CALCIUM in the last 72 hours. ? ? ?Intake/Output Summary (Last 24 hours) at 08/23/2021 0948 ?Last data filed at 08/23/2021 0739 ?Gross per 24 hour  ?Intake 598 ml  ?Output 2142 ml  ?Net -1544 ml  ?  ? ?  ? ?Physical Exam: ?Vital Signs ?Blood pressure 127/82, pulse 86, temperature 98.2 ?F (36.8 ?C), temperature source Oral, resp. rate 19, height 5\' 9"  (1.753 m), weight 83.1 kg, SpO2 98 %. ? ? ?Constitutional: No acute distress. Vital signs reviewed, stable. ?HEENT: NCAT, EOMI, oral membranes moist ?Neck: supple ?Cardiovascular: RRR, absence of murmur. JVD absent. ?Respiratory/Chest: CTA bilateral, no wheezes or rales, Normal effort. ?GI/Abdomen: BS + all quadrants, non-distended, non-tender ?Ext: no edema, cyanosis, or clubbing ?Psych: pleasant and cooperative  ?Neurologic: Non-focal exam with CN II-XII intact, insight and awareness are both fair.  Motor strength 4-5/5 BUE's and BLE's.  Sensory exam normal to light touch, all extremities.  Cerebellar function normal, no ataxia.   ?Musculoskeletal: Full ROM, no limitations of movement of trunk, neck, or extremities.  Posture normal.  No pain with AROM or PROM. ?Skin: intact. Graft site healing well, closed with skin glue. ?  ? ?Assessment/Plan: ?1. Functional deficits which require 3+ hours per day of interdisciplinary therapy in a comprehensive inpatient rehab setting. ?Physiatrist is providing close team  supervision and 24 hour management of active medical problems listed below. ?Physiatrist and rehab team continue to assess barriers to discharge/monitor patient progress toward functional and medical goals ? ?Care Tool: ? ?Bathing ?   ?Body parts bathed by patient: Right arm, Left arm, Chest, Abdomen, Front perineal area, Buttocks, Right upper leg, Left upper leg, Face, Left lower leg, Right lower leg  ? Body parts bathed by helper: Right lower leg, Left lower leg ?  ?  ?Bathing assist Assist Level: Set up assist ?  ?  ?Upper Body Dressing/Undressing ?Upper body dressing   ?What is the patient wearing?: Pull over shirt ?   ?Upper body assist Assist Level: Independent with assistive device ?   ?Lower Body Dressing/Undressing ?Lower body dressing ? ? ?   ?What is the patient wearing?: Incontinence brief, Underwear/pull up ? ?  ? ?Lower body assist Assist for lower body dressing: Independent with assitive device ?   ? ?Toileting ?Toileting    ?Toileting assist Assist for toileting: Independent with assistive device ?  ?  ?Transfers ?Chair/bed transfer ? ?Transfers assist ?   ? ?Chair/bed transfer assist level: Independent with assistive device ?Chair/bed transfer assistive device: Walker ?  ?Locomotion ?Ambulation ? ? ?Ambulation assist ? ?   ? ?Assist level: Supervision/Verbal cueing ?Assistive device: Walker-rolling ?Max distance: >166ft  ? ?Walk 10  feet activity ? ? ?Assist ?   ? ?Assist level: Supervision/Verbal cueing ?Assistive device: Walker-rolling  ? ?Walk 50 feet activity ? ? ?Assist   ? ?Assist level: Supervision/Verbal cueing ?Assistive device: Walker-rolling  ? ? ?Walk 150 feet activity ? ? ?Assist   ? ?Assist level: Supervision/Verbal cueing ?Assistive device: Walker-rolling ?  ? ?Walk 10 feet on uneven surface  ?activity ? ? ?Assist Walk 10 feet on uneven surfaces activity did not occur: Safety/medical concerns ? ? ?Assist level: Supervision/Verbal cueing ?Assistive device: Walker-rolling   ? ?Wheelchair ? ? ? ? ?Assist Is the patient using a wheelchair?: No ?  ?  ? ?  ?   ? ? ?Wheelchair 50 feet with 2 turns activity ? ? ? ?Assist ? ?  ?  ? ? ?   ? ?Wheelchair 150 feet activity  ? ? ? ?Assist ?   ? ? ?   ? ?Blood pressure 127/82, pulse 86, temperature 98.2 ?F (36.8 ?C), temperature source Oral, resp. rate 19, height 5\' 9"  (1.753 m), weight 83.1 kg, SpO2 98 %. ? ?Medical Problem List and Plan: ?1. 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            -patient may shower ?            -ELOS/Goals: 4/9, mod I to sup with PT, OT, and sup with SLP ? -4/9 D/C home today. ?2.  Antithrombotics: ?-DVT/anticoagulation:  Mechanical:  resumed eilquis yesterday ?            -pt is ambulating 100+' in therapy ?            -antiplatelet therapy: n/a ?3. Pain Management: tylenol prn ?-muscle rub or heat/ice to chest wall prn, lidocaine patch pt requesting muscle relaxer , added methocarbamol  ?-4/6-7 lidocaine patches are very helpful for pain ?-4/8 Continued use of lidocaine patches, pain well managed. ?-4/9 No reports of pain today.  ?-Continue prn Tylenol as needed for pain. ?4. Mood: team to provide egosupport as necessary ?            -antipsychotic agents: n/a ? -neuropsych eval today ?4/8 Pt was a bit sad today, vocalized concerns about possible changes in sexual function s/p TBI.  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  Encouraged pt to have follow-up discussions with outpatient PM &R clinic provider as needed about concerns. ?4/9 In upbeat mood, excited to go home.   ?-Continue monitoring for concerns for sexual function.  Patient Claude follow-up outpatient with rehab team. ?5. Neuropsych: This patient is capable of making decisions on his own behalf. ?6. Skin/Wound Care: continue local care ?            -maximize nutritional intake ?7. Fluids/Electrolytes/Nutrition/dysphagia: pt cleared for D2/nectar diet by SLP 3/30 ?             -resumed diet as above  ?8. NICM w/diastolic CHF/PEA arrest: ?9.T2DM: Hgb A1c- 7.2. Monitor BS ac/hs and use SSI for elevated BS ?            --continue insulin Glargline 15 units daily ?CBG (last 3)  ?Recent Labs  ?  08/22/21 ?1610 08/23/21 ?0059 08/23/21 ?1583  ?GLUCAP 140* 154* 128*  ?Reduce glargine to 10U ?4/7 cbg's still running low. Dc semglee today ?4/8 CBG's holding steady with semglee d/c'ed ?4/9 CBG's have been reviewed and look good. ?-4/9 Continue current insulin dose. ? ?10. ESRD: NPO after midnight Sunday for  AVF on Monday ?            --HD TTS- per Nephro ?            -08/17/21 Left arm AVF graft done by Dr. [name] (Vascular Surgery) ?            -4/8 Dialysis today per nephrology     ?11. Anemia of chronic disease:  On iron for supplementation with Aranesp weekly. ?12. LLL PNA/MSSA bacteremia: To continue ancef thur April 4th. ?4/3 Continue Ancef for one additional day. ?4/4 Ancef completed. ?13.  NSVT/PAF: On amiodarone. Eliquis was hold pending procedure.  ?-4/4 Per discussion with Dr. Naaman Plummer, Eliquis resumed ?-Per consult with Oda Kilts, PA EP, no indication for Lifevest at this time. ?PEA is not a covered indication and we only use Lifevest as a bridge.  -monitor HR TID and for symptoms with activity.  ?-4/9 Continue Eliquis 5 mg dose. ?14. Elevated TSH: Due to stress-->repeat labs in a month per recs.  ?-4/9 Follow-up with outpatient PCP once labs have returned. ?  ?Greater than 30 minutes discussing with patient discharge instructions with patient and answering questions pertinent to his care. ? ?LOS: ?9 days ?A FACE TO FACE EVALUATION WAS PERFORMED ? ?Luetta Nutting ?08/23/2021, 9:48 AM  ? ? ? ? ?

## 2021-08-23 NOTE — Progress Notes (Signed)
?[name] ?Progress Note  ? ?Subjective:    ?Completed dialysis yesterday. No new issues. Feels well.  ?Plans for discharge today  ? ?Objective ?Vitals:  ? 08/23/21 0006 08/23/21 0057 08/23/21 0432 08/23/21 1315  ?BP: 123/65 140/78 127/82 104/62  ?Pulse:  89 86 78  ?Resp: (!) 24 20 19 18   ?Temp: 98.4 ?F (36.9 ?C) 98.3 ?F (36.8 ?C) 98.2 ?F (36.8 ?C) 98 ?F (36.7 ?C)  ?TempSrc: Oral Oral Oral Oral  ?SpO2:  98% 98% 100%  ?Weight: 83.5 kg  83.1 kg   ?Height:      ? ?Physical Exam ?General: Older male; awake and alert; NAD ?Heart:S1 and S2; No murmurs, gallops, or rubs ?Lungs: Clear anteriorly and laterally ?Abdomen: Soft and non-tender ?Extremities: no LE edema  ?Dialysis Access: [facility]; s/p L AVF 08/17/21 B/T ? ?Filed Weights  ? 08/22/21 2006 08/23/21 0006 08/23/21 0432  ?Weight: 85.5 kg 83.5 kg 83.1 kg  ? ? ?Intake/Output Summary (Last 24 hours) at 08/23/2021 1337 ?Last data filed at 08/23/2021 1315 ?Gross per 24 hour  ?Intake 594 ml  ?Output 2142 ml  ?Net -1548 ml  ? ? ? ?Additional Objective ?Labs: ?Basic Metabolic Panel: ?Recent Labs  ?Lab 08/17/21 ?1158 08/18/21 ?1228 08/20/21 ?0500  ?NA 136 137 134*  ?K 4.2 4.6 4.4  ?CL 98 99 101  ?CO2  --  25 26  ?GLUCOSE 84 254* 116*  ?BUN 52* 78* 46*  ?CREATININE 5.80* 6.26* 5.46*  ?CALCIUM  --  7.8* 7.5*  ?PHOS  --  6.6* 4.6  ? ? ?Liver Function Tests: ?Recent Labs  ?Lab 08/18/21 ?1228 08/20/21 ?0500  ?ALBUMIN 2.0* 1.8*  ? ? ?No results for input(s): LIPASE, AMYLASE in the last 168 hours. ?CBC: ?Recent Labs  ?Lab 08/17/21 ?1158 08/18/21 ?1230 08/20/21 ?0500  ?WBC  --  5.0 3.4*  ?NEUTROABS  --   --  1.7  ?HGB 9.9* 7.4* 7.2*  ?HCT 29.0* 24.9* 23.6*  ?MCV  --  91.9 90.4  ?PLT  --  211 197  ? ? ?Blood Culture ?   ?Component Value Date/Time  ? SDES BLOOD RIGHT HAND 08/02/2021 0536  ? SDES BLOOD LEFT HAND 08/02/2021 0536  ? SPECREQUEST AEROBIC BOTTLE ONLY Blood Culture adequate volume 08/02/2021 0536  ? SPECREQUEST AEROBIC BOTTLE ONLY Blood Culture adequate volume  08/02/2021 0536  ? CULT  08/02/2021 0536  ?  NO GROWTH 5 DAYS ?Performed at [facility], [place] [address], [place], [place] [address] ?  ? CULT  08/02/2021 0536  ?  NO GROWTH 5 DAYS ?Performed at [facility], [place] [address], [place], [place] [address] ?  ? REPTSTATUS 08/07/2021 FINAL 08/02/2021 0536  ? REPTSTATUS 08/07/2021 FINAL 08/02/2021 0536  ? ? ?Cardiac Enzymes: ?No results for input(s): CKTOTAL, CKMB, CKMBINDEX, TROPONINI in the last 168 hours. ?CBG: ?Recent Labs  ?Lab 08/22/21 ?1205 08/22/21 ?1610 08/23/21 ?0059 08/23/21 ?9379 08/23/21 ?1159  ?GLUCAP 127* 140* 154* 128* 155*  ? ? ?Iron Studies: No results for input(s): IRON, TIBC, TRANSFERRIN, FERRITIN in the last 72 hours. ?Lab Results  ?Component Value Date  ? INR 1.5 (H) 07/27/2021  ? ?Studies/Results: ?No results found. ? ?Medications: ? ferric gluconate (FERRLECIT) IVPB Stopped (08/20/21 1842)  ? ? acetaminophen  650 mg Oral TID  ? amiodarone  200 mg Oral BID  ? apixaban  5 mg Oral BID  ? Chlorhexidine Gluconate Cloth  6 each Topical BID  ? darbepoetin (ARANESP) injection - DIALYSIS  200 mcg Intravenous  Q Tue-HD  ? feeding supplement (NEPRO CARB STEADY)  237 mL Oral BID WC  ? guaiFENesin  600 mg Oral BID  ? insulin aspart  0-6 Units Subcutaneous TID WC  ? lidocaine  3 patch Transdermal Q24H  ? mouth rinse  15 mL Mouth Rinse BID  ? multivitamin  1 tablet Oral QHS  ? ? ?Dialysis Orders: ?New Start-schedule to start at [facility] TTS ? ?Assessment/Plan: ?1. Renal-  Originally admitted AKI on chronic renal failure 2nd ATN with cardiac arrest. First HD 3/22, HD now on TTS schedule here via [facility]. SrCr currently not improving. 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 Next HD 4/11 at outpatient center.  ?- S/P L AVF placement 08/17/21 by Dr. [name] ?2. S/p cardiac arrest  Afib-  Cardiology consulted and plan to follow outpatient.  ?3. Anemia-  hgb 7.2  trending down.   ESA given 4/4. IV  iron ordered as well-  transfuse as needed ?4. MSSA bact-  found on 3/17-  completed ancef course on 4/2 ?4. Secondary hyperparathyroidism-  PTH 147- no meds-  phos now 4.6- no binder for now ?5. HTN/volume-  BP and volume controlled-  no BP meds-  minimal UF with HD required ? ?[name] PA-C ?[facility] ?08/23/2021,1:37 PM ? ?

## 2021-08-24 NOTE — Progress Notes (Signed)
Inpatient Rehabilitation Care Coordinator ?Discharge Note  ? ?Patient Details  ?Name: [name]. ?MRN: [id number] ?Date of Birth: [birth year] ? ? ?Discharge location: D/c to home ? ?Length of Stay: 8 days ? ?Discharge activity level: Supervision using RW ? ?Home/community participation: Limited ? ?Patient response PY:YFRTMY Literacy - How often do you need to have someone help you when you read instructions, pamphlets, or other written material from your doctor or pharmacy?: Never ? ?Patient response TR:ZNBVAP Isolation - How often do you feel lonely or isolated from those around you?: Never ? ?Services provided included: MD, RD, PT, SLP, Pharmacy, Neuropsych, SW, TR, RN, CM, OT ? ?Financial Services:  ?Charity fundraiser Utilized: Private Insurance ?[employer] Medicare ? ?Choices offered to/list presented to: Yes ? ?Follow-up services arranged:  ?Outpatient, DME, Other (Comment) (Patient set up for [facility] [place]- transportation services for dialysis) ?   ?Outpatient Servicies: [facility] for PT/OT/SLP ?DME : [place] for RW ?  ? ?Patient response to transportation need: ?Is the patient able to respond to transportation needs?: Yes ?In the past 12 months, has lack of transportation kept you from medical appointments or from getting medications?: No ?In the past 12 months, has lack of transportation kept you from meetings, work, or from getting things needed for daily living?: No ? ? ? ?Comments (or additional information): ? ?Patient/Family verbalized understanding of follow-up arrangements:  Yes ? ?Individual responsible for coordination of the follow-up plan: contact pt son Linton Rump ? ?Confirmed correct DME delivered: Rana Snare 08/24/2021   ? ?Rana Snare ?

## 2021-08-25 ENCOUNTER — Telehealth: Payer: Self-pay | Admitting: Registered Nurse

## 2021-08-25 ENCOUNTER — Other Ambulatory Visit (HOSPITAL_COMMUNITY): Payer: Self-pay

## 2021-08-25 DIAGNOSIS — N178 Other acute kidney failure: Secondary | ICD-10-CM | POA: Diagnosis not present

## 2021-08-25 DIAGNOSIS — Z992 Dependence on renal dialysis: Secondary | ICD-10-CM | POA: Diagnosis not present

## 2021-08-25 DIAGNOSIS — N179 Acute kidney failure, unspecified: Secondary | ICD-10-CM | POA: Diagnosis not present

## 2021-08-25 DIAGNOSIS — N2581 Secondary hyperparathyroidism of renal origin: Secondary | ICD-10-CM | POA: Diagnosis not present

## 2021-08-25 NOTE — Telephone Encounter (Signed)
Transition Care Management Unsuccessful Follow-up Telephone Call ? ?Date of discharge and from where:  08/23/2021 [facility] ? ?Attempts:  1st Attempt ? ?Reason for unsuccessful TCM follow-up call:  Left voice message, no answer ? ?  ?

## 2021-08-27 DIAGNOSIS — N2581 Secondary hyperparathyroidism of renal origin: Secondary | ICD-10-CM | POA: Diagnosis not present

## 2021-08-27 DIAGNOSIS — N178 Other acute kidney failure: Secondary | ICD-10-CM | POA: Diagnosis not present

## 2021-08-27 DIAGNOSIS — Z992 Dependence on renal dialysis: Secondary | ICD-10-CM | POA: Diagnosis not present

## 2021-08-28 ENCOUNTER — Ambulatory Visit (INDEPENDENT_AMBULATORY_CARE_PROVIDER_SITE_OTHER): Payer: Medicare PPO | Admitting: Family Medicine

## 2021-08-28 ENCOUNTER — Encounter: Payer: Self-pay | Admitting: Family Medicine

## 2021-08-28 VITALS — BP 157/95 | HR 86 | Ht 69.0 in | Wt 179.6 lb

## 2021-08-28 DIAGNOSIS — B351 Tinea unguium: Secondary | ICD-10-CM | POA: Diagnosis not present

## 2021-08-28 DIAGNOSIS — Z09 Encounter for follow-up examination after completed treatment for conditions other than malignant neoplasm: Secondary | ICD-10-CM | POA: Diagnosis not present

## 2021-08-28 DIAGNOSIS — N186 End stage renal disease: Secondary | ICD-10-CM | POA: Diagnosis not present

## 2021-08-28 DIAGNOSIS — Z992 Dependence on renal dialysis: Secondary | ICD-10-CM | POA: Diagnosis not present

## 2021-08-28 DIAGNOSIS — I5020 Unspecified systolic (congestive) heart failure: Secondary | ICD-10-CM

## 2021-08-28 DIAGNOSIS — R7989 Other specified abnormal findings of blood chemistry: Secondary | ICD-10-CM

## 2021-08-28 DIAGNOSIS — I4891 Unspecified atrial fibrillation: Secondary | ICD-10-CM | POA: Diagnosis not present

## 2021-08-28 MED ORDER — TERBINAFINE HCL 250 MG PO TABS: 250.0000 mg | ORAL_TABLET | Freq: Every day | ORAL | 0 refills | Status: DC

## 2021-08-28 NOTE — Progress Notes (Signed)
? ? ?  SUBJECTIVE:  ? ?CHIEF COMPLAINT / HPI:  ? ?Hospital follow up, desires home health services ?States he is doing much better. He recently left the rehab hospital but continues to do his exercises. His daughter is here today and she has some concerns: ?Home health services desired- She states she took off of work this week to help her father with his medication and other ADLs such as meals. She has put all medications in pill boxes and crushed them up for him for the week. Having to use a walker now makes it harder for him to get around and he still feels unsteady on his feet. The patient states he has not issue with bathing himself. The daughter believes a Therapist, sports or nursing aide would be helpful to him.  ?Herbal supplements- Concern for tumeric and ashwagandha interfering with other medications. Was told by nephrologist that tumeric was ok.  ?Toe nail fungus- He states his toes are yellow and have been for some time. He has never been treated for toe fungus.  ? ?PERTINENT  PMH / PSH: Elevated TSH during prior hospitalization ? ?OBJECTIVE:  ? ?BP (!) 157/95   Pulse 86   Ht 5\' 9"  (1.753 m)   Wt 179 lb 9.6 oz (81.5 kg)   SpO2 100%   BMI 26.52 kg/m?   ?Physical Exam ?Vitals reviewed.  ?Constitutional:   ?   General: He is not in acute distress. ?   Appearance: He is not ill-appearing, toxic-appearing or diaphoretic.  ?Cardiovascular:  ?   Rate and Rhythm: Normal rate. Rhythm irregular.  ?Pulmonary:  ?   Effort: Pulmonary effort is normal.  ?   Breath sounds: Normal breath sounds.  ?Musculoskeletal:  ?   Right lower leg: Edema present.  ?   Left lower leg: Edema present.  ?   Comments: Compression stocking bilaterally. Toenails are thickened and hyperpigmented with a yellow hue. See picture below.   ?Neurological:  ?   Mental Status: He is alert and oriented to person, place, and time.  ?   Comments: Walks slowly with rollator  ?Psychiatric:     ?   Mood and Affect: Mood normal.     ?   Behavior: Behavior normal.   ? ?  ?Media Information ?Document Information ? ?Photos  ?  ?08/28/2021 15:23  ?Attached To:  ?Office Visit on 08/28/21 with [name], Naaman Plummer, DO  ? ?Source Information ? ?[name], [place] [facility] Med Resident  ? ? ?ASSESSMENT/PLAN:  ? 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 Started on dialysis during hospitalization. Discharged from rehab this week. Previously functionally independent and now walks with assistive device. Family in need of RN/aide assistance with medications and ADLs.  ?- Ambulatory referral to Home Health ? ?Elevated TSH ?During hosptialization. Needs repeat today.  ?- TSH ? ?Onychomycosis ?- Oral terbinafine 250 mg daily for 12 weeks.  ?- Comprehensive metabolic panel ? ?Follow up in 1 month.  ? ?Gerlene Fee, DO ?[place]  ?

## 2021-08-28 NOTE — Patient Instructions (Addendum)
It was wonderful to see you today. ? ?Please bring ALL of your medications with you to every visit.  ? ?Today we talked about: ? ?Receiving help with a home nurse/aide.  I have sent in the referral for this. ?Onychomycosis (nail infection of the foot)-please take terbinafine for a total of 12 weeks 1 tablet daily. ?Follow-up in 1 month. ? ?Please be sure to schedule follow up at the front  desk before you leave today.  ? ?If you haven't already, sign up for My Chart to have easy access to your labs results, and communication with your primary care physician. ? ?Please call the clinic at [phone] if your symptoms worsen or you have any concerns. It was our pleasure to serve you. ? ?Dr. Janus Molder ? ?

## 2021-08-29 DIAGNOSIS — N178 Other acute kidney failure: Secondary | ICD-10-CM | POA: Diagnosis not present

## 2021-08-29 DIAGNOSIS — Z992 Dependence on renal dialysis: Secondary | ICD-10-CM | POA: Diagnosis not present

## 2021-08-29 DIAGNOSIS — N2581 Secondary hyperparathyroidism of renal origin: Secondary | ICD-10-CM | POA: Diagnosis not present

## 2021-08-29 LAB — COMPREHENSIVE METABOLIC PANEL
ALT: 6 IU/L (ref 0–44)
AST: 27 IU/L (ref 0–40)
Albumin/Globulin Ratio: 0.9 — ABNORMAL LOW (ref 1.2–2.2)
Albumin: 3.4 g/dL — ABNORMAL LOW (ref 3.7–4.7)
Alkaline Phosphatase: 166 IU/L — ABNORMAL HIGH (ref 44–121)
BUN/Creatinine Ratio: 6 — ABNORMAL LOW (ref 10–24)
BUN: 23 mg/dL (ref 8–27)
Bilirubin Total: 0.4 mg/dL (ref 0.0–1.2)
CO2: 30 mmol/L — ABNORMAL HIGH (ref 20–29)
Calcium: 8.4 mg/dL — ABNORMAL LOW (ref 8.6–10.2)
Chloride: 98 mmol/L (ref 96–106)
Creatinine, Ser: 3.71 mg/dL — ABNORMAL HIGH (ref 0.76–1.27)
Globulin, Total: 4 g/dL (ref 1.5–4.5)
Glucose: 112 mg/dL — ABNORMAL HIGH (ref 70–99)
Potassium: 4.6 mmol/L (ref 3.5–5.2)
Sodium: 141 mmol/L (ref 134–144)
Total Protein: 7.4 g/dL (ref 6.0–8.5)
eGFR: 17 mL/min/{1.73_m2} — ABNORMAL LOW (ref >59)

## 2021-08-29 LAB — TSH: TSH: 5.84 u[IU]/mL — ABNORMAL HIGH (ref 0.450–4.500)

## 2021-08-31 ENCOUNTER — Other Ambulatory Visit (HOSPITAL_COMMUNITY): Payer: Self-pay

## 2021-09-01 DIAGNOSIS — N179 Acute kidney failure, unspecified: Secondary | ICD-10-CM | POA: Diagnosis not present

## 2021-09-01 DIAGNOSIS — Z992 Dependence on renal dialysis: Secondary | ICD-10-CM | POA: Diagnosis not present

## 2021-09-01 DIAGNOSIS — N2581 Secondary hyperparathyroidism of renal origin: Secondary | ICD-10-CM | POA: Diagnosis not present

## 2021-09-01 DIAGNOSIS — N178 Other acute kidney failure: Secondary | ICD-10-CM | POA: Diagnosis not present

## 2021-09-03 DIAGNOSIS — N178 Other acute kidney failure: Secondary | ICD-10-CM | POA: Diagnosis not present

## 2021-09-03 DIAGNOSIS — N2581 Secondary hyperparathyroidism of renal origin: Secondary | ICD-10-CM | POA: Diagnosis not present

## 2021-09-03 DIAGNOSIS — Z992 Dependence on renal dialysis: Secondary | ICD-10-CM | POA: Diagnosis not present

## 2021-09-05 DIAGNOSIS — N2581 Secondary hyperparathyroidism of renal origin: Secondary | ICD-10-CM | POA: Diagnosis not present

## 2021-09-05 DIAGNOSIS — Z992 Dependence on renal dialysis: Secondary | ICD-10-CM | POA: Diagnosis not present

## 2021-09-05 DIAGNOSIS — N178 Other acute kidney failure: Secondary | ICD-10-CM | POA: Diagnosis not present

## 2021-09-08 DIAGNOSIS — N2581 Secondary hyperparathyroidism of renal origin: Secondary | ICD-10-CM | POA: Diagnosis not present

## 2021-09-08 DIAGNOSIS — N179 Acute kidney failure, unspecified: Secondary | ICD-10-CM | POA: Diagnosis not present

## 2021-09-08 DIAGNOSIS — Z992 Dependence on renal dialysis: Secondary | ICD-10-CM | POA: Diagnosis not present

## 2021-09-08 DIAGNOSIS — N178 Other acute kidney failure: Secondary | ICD-10-CM | POA: Diagnosis not present

## 2021-09-09 ENCOUNTER — Encounter: Payer: Medicare PPO | Attending: Registered Nurse | Admitting: Registered Nurse

## 2021-09-10 ENCOUNTER — Telehealth: Payer: Self-pay | Admitting: Family Medicine

## 2021-09-10 ENCOUNTER — Telehealth: Payer: Self-pay

## 2021-09-10 ENCOUNTER — Encounter: Payer: Self-pay | Admitting: Family Medicine

## 2021-09-10 DIAGNOSIS — R1310 Dysphagia, unspecified: Secondary | ICD-10-CM

## 2021-09-10 DIAGNOSIS — N178 Other acute kidney failure: Secondary | ICD-10-CM | POA: Diagnosis not present

## 2021-09-10 DIAGNOSIS — N2581 Secondary hyperparathyroidism of renal origin: Secondary | ICD-10-CM | POA: Diagnosis not present

## 2021-09-10 DIAGNOSIS — Z992 Dependence on renal dialysis: Secondary | ICD-10-CM | POA: Diagnosis not present

## 2021-09-10 DIAGNOSIS — I7121 Aneurysm of the ascending aorta, without rupture: Secondary | ICD-10-CM | POA: Insufficient documentation

## 2021-09-10 NOTE — Telephone Encounter (Signed)
[name] SLP calls nurse line requesting an order for Modified Barium Swallow.  ? ?[name] forward to PCP for ordering.  ?

## 2021-09-10 NOTE — Telephone Encounter (Signed)
Orders placed in the encounter.  ? ?Gerlene Fee, DO ?09/10/2021, 12:25 PM ?PGY-3, [place] ? ?

## 2021-09-10 NOTE — Telephone Encounter (Signed)
Attempted to call patient with TSH results. He is in dialysis and individual who picked up the phone stated he Abriel be back around 3 pm this evening. ? ?It remains elevated. Isao repeat at follow up. Gail send letter.  ? ?Verland also notify him that he had an ascending aortic aneurysm on CT that Ajdin require annual CTA or MRA follow up. This was noted on his problem list.  ? ?Gerlene Fee, DO ?09/10/2021, 12:17 PM ?PGY-3, [place] ? ?

## 2021-09-10 NOTE — Telephone Encounter (Signed)
[name] SLP calls nurse line requesting verbal orders for home health speech as follows.  ? ?1x a week for 4 weeks  ? ?Verbal order given.  ?

## 2021-09-11 ENCOUNTER — Telehealth: Payer: Self-pay

## 2021-09-11 ENCOUNTER — Telehealth (HOSPITAL_COMMUNITY): Payer: Self-pay

## 2021-09-11 ENCOUNTER — Other Ambulatory Visit (HOSPITAL_COMMUNITY): Payer: Self-pay

## 2021-09-11 DIAGNOSIS — R1310 Dysphagia, unspecified: Secondary | ICD-10-CM

## 2021-09-11 NOTE — Telephone Encounter (Signed)
[facility] OT calls nurse line requesting verbal orders for [facility] OT as follows.  ? ?1x a week for 4 weeks  ? ?Verbal given.  ?

## 2021-09-11 NOTE — Telephone Encounter (Signed)
Attempted to contact patient to schedule OP MBS - left voicemail. ?

## 2021-09-12 DIAGNOSIS — N2581 Secondary hyperparathyroidism of renal origin: Secondary | ICD-10-CM | POA: Diagnosis not present

## 2021-09-12 DIAGNOSIS — Z992 Dependence on renal dialysis: Secondary | ICD-10-CM | POA: Diagnosis not present

## 2021-09-12 DIAGNOSIS — N178 Other acute kidney failure: Secondary | ICD-10-CM | POA: Diagnosis not present

## 2021-09-14 ENCOUNTER — Telehealth: Payer: Self-pay

## 2021-09-14 ENCOUNTER — Other Ambulatory Visit: Payer: Self-pay | Admitting: Family Medicine

## 2021-09-14 ENCOUNTER — Other Ambulatory Visit: Payer: Self-pay

## 2021-09-14 DIAGNOSIS — I4891 Unspecified atrial fibrillation: Secondary | ICD-10-CM

## 2021-09-14 MED ORDER — AMIODARONE HCL 200 MG PO TABS: 200.0000 mg | ORAL_TABLET | Freq: Two times a day (BID) | ORAL | 0 refills | Status: DC

## 2021-09-14 MED ORDER — APIXABAN 5 MG PO TABS: 5.0000 mg | ORAL_TABLET | Freq: Two times a day (BID) | ORAL | 0 refills | Status: DC

## 2021-09-14 MED ORDER — TRAZODONE HCL 50 MG PO TABS: 25.0000 mg | ORAL_TABLET | Freq: Every evening | ORAL | 0 refills | Status: DC | PRN

## 2021-09-14 MED ORDER — RENA-VITE PO TABS: 1.0000 | ORAL_TABLET | Freq: Every day | ORAL | 0 refills | Status: DC

## 2021-09-14 MED ORDER — GUAIFENESIN ER 600 MG PO TB12: 600.0000 mg | ORAL_TABLET | Freq: Two times a day (BID) | ORAL | 0 refills | Status: DC

## 2021-09-14 MED ORDER — INSULIN GLARGINE 100 UNIT/ML SOLOSTAR PEN: 10.0000 [IU] | PEN_INJECTOR | Freq: Every day | SUBCUTANEOUS | 0 refills | Status: DC

## 2021-09-14 NOTE — Telephone Encounter (Signed)
[name] SW calls nurse line requesting verbal order for [facility] SW. ? ?1 week 1  ? ?Verbal order given.  ?

## 2021-09-15 ENCOUNTER — Other Ambulatory Visit: Payer: Self-pay

## 2021-09-15 DIAGNOSIS — N184 Chronic kidney disease, stage 4 (severe): Secondary | ICD-10-CM

## 2021-09-16 ENCOUNTER — Ambulatory Visit (INDEPENDENT_AMBULATORY_CARE_PROVIDER_SITE_OTHER): Payer: Self-pay | Admitting: Podiatry

## 2021-09-16 DIAGNOSIS — Z91199 Patient's noncompliance with other medical treatment and regimen due to unspecified reason: Secondary | ICD-10-CM

## 2021-09-16 NOTE — Progress Notes (Signed)
No show

## 2021-09-17 NOTE — Telephone Encounter (Signed)
Patient scheduled for 5/10. ?

## 2021-09-21 ENCOUNTER — Telehealth: Payer: Self-pay

## 2021-09-21 ENCOUNTER — Other Ambulatory Visit (HOSPITAL_COMMUNITY): Payer: Self-pay

## 2021-09-21 NOTE — Telephone Encounter (Signed)
[facility] OT calls nurse line requesting verbal orders for [facility] OT as follows.  ? ?1x a week for 4 weeks  ? ?Verbal order given.  ?

## 2021-09-22 ENCOUNTER — Other Ambulatory Visit: Payer: Self-pay | Admitting: Family Medicine

## 2021-09-22 ENCOUNTER — Other Ambulatory Visit (HOSPITAL_COMMUNITY): Payer: Self-pay

## 2021-09-22 DIAGNOSIS — I4891 Unspecified atrial fibrillation: Secondary | ICD-10-CM

## 2021-09-23 ENCOUNTER — Ambulatory Visit (HOSPITAL_COMMUNITY)
Admission: RE | Admit: 2021-09-23 | Discharge: 2021-09-23 | Disposition: A | Discharge status: Home / Self Care | Payer: Medicare (Managed Care) | Source: Ambulatory Visit | Attending: Family Medicine | Admitting: Family Medicine

## 2021-09-23 DIAGNOSIS — R1310 Dysphagia, unspecified: Secondary | ICD-10-CM | POA: Insufficient documentation

## 2021-09-23 NOTE — Progress Notes (Signed)
Modified Barium Swallow Progress Note ? ?Patient Details  ?Name: [name]. ?MRN: [id number] ?Date of Birth: 05/26/1950 ? ?Today's Date: 09/23/2021 ? ?Modified Barium Swallow completed.  Full report located under Chart Review in the Imaging Section. ? ?Brief recommendations include the following: ? ?Clinical Impression ? Pt demonstrates ongoing oropharyngeal dysphagia that is likely close to prior study. Pt with mild base of tongue residue with thins but otherwise pt able to masticate solids without difficulty. Bolus with thin liquids arrives at pyriform sinuses prior to full airway closure resulting in instance of trace penetration before the swallow, particularly with larger, consecutive boluses. Pt did have accumulation of thin penetrate on vocal folds that spilled to trachea post swallow with no response. Pt independently takes one sip at a time and swallows twice, which prevented penetration and eliminated residue including oral residue that was contributing to vallecular residue. Pt only aspirated when pushed by therapist to drink rapidly. Trialed chin tuck and effortful swallow with no improvement. Recommend pt continue thin liquids with strategies, but may also upgrade to regular solids, also with two swallows after each bite. Pt to f/u with SLP for ongoing interventions to target base of tongue strength and compensatory strategies. ?  ?Swallow Evaluation Recommendations ? ?   ? ? SLP Diet Recommendations: Regular solids;Thin liquid ? ? Liquid Administration via: Cup;No straw ? ? Medication Administration: Crushed with puree ? ? Supervision: Patient able to self feed;Intermittent supervision to cue for compensatory strategies ? ? Compensations: Slow rate;Small sips/bites;Multiple dry swallows after each bite/sip;Clear throat intermittently ? ? Postural Changes: Seated upright at 90 degrees ? ?   ? ?   ? ? ? ?Maiana Hennigan, Katherene Ponto ?09/23/2021,3:12 PM ?

## 2021-09-25 ENCOUNTER — Telehealth: Payer: Self-pay

## 2021-09-25 NOTE — Telephone Encounter (Signed)
[place] SLP calls nurse line requesting verbal orders for [facility] speech as follows.  ? ?1x a week for 5 weeks  ? ?Verbal order given.  ?

## 2021-09-28 ENCOUNTER — Telehealth: Payer: Self-pay

## 2021-09-28 NOTE — Telephone Encounter (Signed)
[name] SW calls nurse line requesting a verbal orders for social work. ? ?Subsequent social work visit in 2 weeks.  ? ?Verbal order given.  ?

## 2021-09-29 ENCOUNTER — Encounter (HOSPITAL_COMMUNITY): Payer: Medicare PPO

## 2021-09-29 ENCOUNTER — Other Ambulatory Visit (HOSPITAL_COMMUNITY): Payer: Self-pay

## 2021-09-29 ENCOUNTER — Telehealth: Payer: Self-pay

## 2021-09-29 NOTE — Telephone Encounter (Signed)
Rec'd a fax from [facility] stating pt rec'd temporary supply from them of Insulin Glargine Solostar from his pharmacy but medication is not on formulary. ? ?No longer going to do prior auth for the pens, pt was changed to vials 09/29/21 which are covered on insurance. ?

## 2021-09-30 ENCOUNTER — Encounter: Payer: Self-pay | Admitting: Occupational Therapy

## 2021-09-30 ENCOUNTER — Ambulatory Visit: Payer: Self-pay | Admitting: Physical Therapy

## 2021-10-02 ENCOUNTER — Telehealth: Payer: Self-pay

## 2021-10-02 NOTE — Telephone Encounter (Signed)
Wife calls nurse line requesting a script for a continuous glucose monitor.   Patient does have an UTD A1c and injects insulin more than once per day. He should qualify.   Patient is requesting the 14 day libre.   [name] forward to PCP.

## 2021-10-06 MED ORDER — FREESTYLE LIBRE 14 DAY SENSOR MISC: 1.0000 "application " | 3 refills | Status: DC

## 2021-10-06 NOTE — Telephone Encounter (Signed)
Please make sure patient has a smart phone able to read Central sensor. If not, I Ezra put in for sensor reader if possible.   Lucas Fee, DO 10/06/2021, 4:52 PM PGY-3, [place]

## 2021-10-13 ENCOUNTER — Telehealth: Payer: Self-pay

## 2021-10-13 ENCOUNTER — Other Ambulatory Visit (HOSPITAL_COMMUNITY): Payer: Self-pay

## 2021-10-13 NOTE — Telephone Encounter (Signed)
A Prior Authorization was initiated for this patients FREESTYLE LIBRE 14 DAY SENSOR through CoverMyMeds. (Submitted to [employer], primary insurance).  Key: [id number]

## 2021-10-13 NOTE — Telephone Encounter (Addendum)
Prior Auth for patients medication FREESTYLE LIBRE SENSOR denied by [facility] MEDICARE via CoverMyMeds.   Reason: medication is a medical supply  CoverMyMeds Key: [id number]  Can submit request via [employer] health, but patient needs an appt for diabetes management.

## 2021-10-13 NOTE — Telephone Encounter (Signed)
Rec'd fax from [facility]. CGM previously denied by PART D. CGM approved under PART B coverage.   Guerry still submit order to parachute once patient is seen.  Approval letter scanned to pt's chart.

## 2021-10-14 NOTE — Telephone Encounter (Signed)
Scheduled

## 2021-10-14 NOTE — Telephone Encounter (Signed)
LVM for patient to call office and schedule an appointment.  [name], [place]

## 2021-10-23 ENCOUNTER — Encounter: Payer: Self-pay | Admitting: Family Medicine

## 2021-10-23 ENCOUNTER — Ambulatory Visit (INDEPENDENT_AMBULATORY_CARE_PROVIDER_SITE_OTHER): Payer: Medicare (Managed Care) | Admitting: Family Medicine

## 2021-10-23 VITALS — BP 152/88 | HR 75 | Ht 69.0 in | Wt 180.4 lb

## 2021-10-23 DIAGNOSIS — R7989 Other specified abnormal findings of blood chemistry: Secondary | ICD-10-CM

## 2021-10-23 DIAGNOSIS — E119 Type 2 diabetes mellitus without complications: Secondary | ICD-10-CM | POA: Diagnosis not present

## 2021-10-23 DIAGNOSIS — I7121 Aneurysm of the ascending aorta, without rupture: Secondary | ICD-10-CM

## 2021-10-23 DIAGNOSIS — N179 Acute kidney failure, unspecified: Secondary | ICD-10-CM | POA: Diagnosis not present

## 2021-10-23 DIAGNOSIS — B351 Tinea unguium: Secondary | ICD-10-CM | POA: Diagnosis not present

## 2021-10-23 DIAGNOSIS — Z992 Dependence on renal dialysis: Secondary | ICD-10-CM

## 2021-10-23 DIAGNOSIS — N186 End stage renal disease: Secondary | ICD-10-CM

## 2021-10-23 LAB — POCT GLYCOSYLATED HEMOGLOBIN (HGB A1C): HbA1c, POC (controlled diabetic range): 5.6 % (ref 0.0–7.0)

## 2021-10-23 NOTE — Patient Instructions (Signed)
It was wonderful to see you today.  Please bring ALL of your medications with you to every visit.   Today we talked about:  - Stop Lantus.  Have your son check your blood sugars daily until you get glucose monitor.  Please notify us if you have any blood sugar over 200 or less than 70. - I am checking your thyroid, kidney, liver function today - Follow-up in 1 month with blood sugar log  Please be sure to schedule follow up at the front  desk before you leave today.   If you haven't already, sign up for My Chart to have easy access to your labs results, and communication with your primary care physician.  Please call the clinic at [phone] if your symptoms worsen or you have any concerns. It was our pleasure to serve you.  Dr. Janus Molder

## 2021-10-23 NOTE — Progress Notes (Unsigned)
    SUBJECTIVE:   CHIEF COMPLAINT / HPI:   Diabetes Current Regimen: Lantus 10 units daily CBGs: Unsure, his son is checking for him  Last A1c: 7.2 on 07/29/21  Denies polyuria, polydipsia, hypoglycemia Statin: n/a ACE/ARB: n/a  Follow up labs Need to have thyroid levels repeated, liver function and kidney function 2/2 hx of elevated TSH, using terbinafine and AKI.    PERTINENT  PMH / PSH: A-fib, HFrEF, AAA (follow up due 2024)  OBJECTIVE:   BP (!) 152/88   Pulse 75   Ht 5\' 9"  (1.753 m)   Wt 180 lb 6.4 oz (81.8 kg)   SpO2 100%   BMI 26.64 kg/m   General: Appears well, no acute distress. Age appropriate. Cardiac: RRR, normal heart sounds, no murmurs Respiratory: CTAB, normal effort Extremities: No edema or cyanosis. Skin: Hyperpigmented toenails bilaterally with evidence of improvement being they appear less thick and yellow Neuro: alert and oriented Psych: normal affect  ASSESSMENT/PLAN:   1. Diabetes mellitus type 2 in nonobese (HCC) A1c 5.6 today. Mandrell discontinue lantus and encourage Mr. Gallier to keep a CBG log. Parameters given per AVS. Aloysius follow up with log in 1 month.  - HgB A1c  2. Elevated TSH During last hospitalization. Outpatient repeat.  - TSH - T3, Free  3. AKI (acute kidney injury) ([place])  ESRD on hemodialysis ([place])  Onychomycosis Regular HD. Completing 12 weeks of terbinafine.  - Comprehensive metabolic panel  4. Aneurysm of ascending aorta without rupture (HCC) 4cm dilatation.  - annual CTA or MRA follow up 07/2022   Gerlene Fee, [place]

## 2021-10-24 LAB — COMPREHENSIVE METABOLIC PANEL
ALT: 23 IU/L (ref 0–44)
AST: 28 IU/L (ref 0–40)
Albumin/Globulin Ratio: 0.8 — ABNORMAL LOW (ref 1.2–2.2)
Albumin: 3.6 g/dL — ABNORMAL LOW (ref 3.7–4.7)
Alkaline Phosphatase: 118 IU/L (ref 44–121)
BUN/Creatinine Ratio: 8 — ABNORMAL LOW (ref 10–24)
BUN: 31 mg/dL — ABNORMAL HIGH (ref 8–27)
Bilirubin Total: 0.3 mg/dL (ref 0.0–1.2)
CO2: 26 mmol/L (ref 20–29)
Calcium: 8.6 mg/dL (ref 8.6–10.2)
Chloride: 99 mmol/L (ref 96–106)
Creatinine, Ser: 4.08 mg/dL — ABNORMAL HIGH (ref 0.76–1.27)
Globulin, Total: 4.4 g/dL (ref 1.5–4.5)
Glucose: 143 mg/dL — ABNORMAL HIGH (ref 70–99)
Potassium: 3.7 mmol/L (ref 3.5–5.2)
Sodium: 143 mmol/L (ref 134–144)
Total Protein: 8 g/dL (ref 6.0–8.5)
eGFR: 15 mL/min/{1.73_m2} — ABNORMAL LOW (ref >59)

## 2021-10-24 LAB — TSH: TSH: 3.84 u[IU]/mL (ref 0.450–4.500)

## 2021-10-24 LAB — T3, FREE: T3, Free: 1.4 pg/mL — ABNORMAL LOW (ref 2.0–4.4)

## 2021-10-25 ENCOUNTER — Other Ambulatory Visit: Payer: Self-pay | Admitting: Family Medicine

## 2021-10-25 DIAGNOSIS — I4891 Unspecified atrial fibrillation: Secondary | ICD-10-CM

## 2021-10-26 MED ORDER — APIXABAN 5 MG PO TABS: ORAL_TABLET | ORAL | 3 refills | Status: DC

## 2021-10-26 MED ORDER — CARVEDILOL 25 MG PO TABS: ORAL_TABLET | ORAL | 3 refills | Status: DC

## 2021-10-27 ENCOUNTER — Telehealth: Payer: Self-pay

## 2021-10-27 NOTE — Telephone Encounter (Signed)
A Prior Authorization was initiated for this patients METHOCARBAMOL through CoverMyMeds.   Key: [id number]

## 2021-10-28 NOTE — Telephone Encounter (Signed)
Additional PA question faxed to office:  Statement req'd that drug is medically necessary to treat members condition because YES or NO (?):   All of the covered drugs on the plans formulary for the same condition would not be as effective for the member as the requested drug; would have adverse effects; or both.  (Covered drugs: baclofen oral tab, tizanidine oral tab, dantrolene oral capsule, cyclobenzaprine oral tab)

## 2021-10-30 ENCOUNTER — Encounter: Payer: Self-pay | Admitting: Family Medicine

## 2021-10-30 NOTE — Telephone Encounter (Signed)
Prior Auth for patients medication METHOCARBAMOL denied by [facility] MEDICARE via CoverMyMeds.   Reason: this drug is not covered on the formulary. We are denying your request because we do not show that you have tried at least 2 covered drugs that can treat your condition. Other covered drug(s) is/are: baclofen oral tablet (10 mg, 20 mg), tizanidine hcl oral tablet (2 mg, 4 mg), dantrolene sodium oral capsule (100 mg, 25 mg, 50 mg), cyclobenzaprine hcl oral tablet (10 mg, 5 mg), tizanidine hcl oral tablet (2 mg, 4 mg). We may be able to make an exception to cover this drug. Your doctor Husam need to send [place] medical records showing that you tried this drug.  Appeal can be initiated if needed.  CoverMyMeds Key: [id number]

## 2021-11-05 NOTE — Progress Notes (Signed)
Postoperative Access Visit   History of Present Illness   [name]. is a 71 y.o. year old male who presents for postoperative follow-up for:  left radiocephalic arteriovenous fistula placement by Dr. [name] on 08/17/21. The patient's wounds are healed.  The patient notes no steal symptoms.  The patient is able to complete their activities of daily living.  The patient is not currently experiencing any pain, numbness, or other symptoms from the surgical site.  He currently dialyzes on T,Th, Sat via right TDC.  Physical Examination   BP 161/88  HR 69       Resp 18     Temp 97.56F        SpO2 100% Height 5'9""    Weight 179lb  left arm Incision is healed, palpable 2+ radial pulse, hand grip is 5/5, sensation in digits is intact, palpable thrill, bruit can be auscultated    Non invasive studies:  +--------------------+----------+-----------------+--------+  AVF                 PSV (cm/s)Flow Vol (mL/min)Comments  +--------------------+----------+-----------------+--------+  Native artery inflow   114           511                 +--------------------+----------+-----------------+--------+  AVF Anastomosis        376                               +--------------------+----------+-----------------+--------+      +------------+----------+-------------+-----------+------------------------  ----+  OUTFLOW VEINPSV (cm/s)Diameter (cm)Depth (cm)                  +------------+----------+-------------+-----------+------------------------  ----+  Prox Forearm    63        0.31        0.22                                    +------------+----------+-------------+-----------+------------------------  ----+  Mid Forearm     67        0.42        0.17    Retained valve and  competing                                                     branch m-u 41 cm/s        +------------+----------+-------------+-----------+------------------------  ----+   Dist Forearm230 / 603  0.38 / 0.21 0.41 / 0.19change in Diameter,  2.17 cm                                                          in length              +------------+----------+-------------+-----------+------------------------  ----+  Medical Decision Making   [name]. is a 71 y.o. year old male who presents s/p left radiocephaic arteriovenous fistula placement by Dr. [name] on 08/17/21.  Patent L radiocephalic AV fistula without signs or symptoms of steal syndrome The patient's fistula does not have the  most ideal diameter yet, ranging between 3-4cm, however the thrill is very strong. The fistula's bruit can be auscultated as well.  The patient's access Willian be ready for use on 11/16/21. The patient's tunneled dialysis catheter can be removed when Nephrology is comfortable with the performance of the L radiocephalic AV fistula The patient may follow up on a prn basis   Vicente Serene, PA-C [facility]: [phone]  Clinic MD: Yetta Barre

## 2021-11-11 ENCOUNTER — Ambulatory Visit (INDEPENDENT_AMBULATORY_CARE_PROVIDER_SITE_OTHER): Payer: Medicare (Managed Care) | Admitting: Physician Assistant

## 2021-11-11 ENCOUNTER — Ambulatory Visit (HOSPITAL_COMMUNITY)
Admission: RE | Admit: 2021-11-11 | Discharge: 2021-11-11 | Disposition: A | Discharge status: Home / Self Care | Payer: Medicare (Managed Care) | Source: Ambulatory Visit | Attending: Vascular Surgery | Admitting: Vascular Surgery

## 2021-11-11 VITALS — BP 161/88 | HR 69 | Temp 97.3°F | Resp 18 | Ht 69.0 in | Wt 179.7 lb

## 2021-11-11 DIAGNOSIS — Z992 Dependence on renal dialysis: Secondary | ICD-10-CM | POA: Diagnosis not present

## 2021-11-11 DIAGNOSIS — N184 Chronic kidney disease, stage 4 (severe): Secondary | ICD-10-CM | POA: Insufficient documentation

## 2021-11-11 DIAGNOSIS — N186 End stage renal disease: Secondary | ICD-10-CM | POA: Diagnosis not present

## 2021-11-23 ENCOUNTER — Telehealth: Payer: Self-pay

## 2021-11-23 DIAGNOSIS — I5020 Unspecified systolic (congestive) heart failure: Secondary | ICD-10-CM

## 2021-11-23 NOTE — Telephone Encounter (Signed)
[name] home health nurse calls nurse line requesting clarification on torsemide.   [name] reports on her medication list summary the patient is only taking 20mg  once per day aside from dialysis days (Tuesday, Thursday or Saturday.)  [name] reports the patient has been taking 20mg  once in am and once in pm everyday.   Please send in correct prescription to [facility] so patient can use bubble packs with days marked.   [place]erius forward to PCP.

## 2021-11-24 ENCOUNTER — Other Ambulatory Visit: Payer: Self-pay

## 2021-11-24 MED ORDER — TORSEMIDE 20 MG PO TABS
ORAL_TABLET | ORAL | 11 refills | Status: DC
  Filled 2021-11-24: qty 30, 30d supply, fill #0

## 2021-11-24 MED ORDER — TORSEMIDE 20 MG PO TABS: ORAL_TABLET | ORAL | 3 refills | Status: DC

## 2021-11-27 MED ORDER — TORSEMIDE 20 MG PO TABS: ORAL_TABLET | ORAL | 3 refills | Status: DC

## 2021-11-27 NOTE — Telephone Encounter (Signed)
[name] returns call to nurse line in regards to [place].   [name] would like additional clarification on days taking.   [name] reports his BP "tends" to bottom out on dialysis days which are Tuesday, Thursday and Saturday.   Is patient taking Torsemide everyday or only on days he does not go to dialysis.   Please advise.

## 2021-11-27 NOTE — Telephone Encounter (Signed)
[place] nurse Elmyra Ricks) called office and reports pt has low BP's on dialysis days (Tues, Thurs, Sat). Prescription was changed and sent to pharmacy.  - Torsemide 20mg  twice in morning, once at night; EXCEPT dialysis days (Tues/Thurs/Sat)

## 2021-11-27 NOTE — Addendum Note (Signed)
Addended by: Arlyce Dice on: 11/27/2021 06:37 PM   Modules accepted: Orders

## 2021-12-07 ENCOUNTER — Other Ambulatory Visit (HOSPITAL_COMMUNITY): Payer: Self-pay

## 2021-12-18 ENCOUNTER — Encounter: Payer: Self-pay | Admitting: Family Medicine

## 2021-12-18 ENCOUNTER — Ambulatory Visit (INDEPENDENT_AMBULATORY_CARE_PROVIDER_SITE_OTHER): Payer: Medicare (Managed Care) | Admitting: Family Medicine

## 2021-12-18 VITALS — BP 146/79 | HR 76 | Wt 174.6 lb

## 2021-12-18 DIAGNOSIS — E162 Hypoglycemia, unspecified: Secondary | ICD-10-CM

## 2021-12-18 DIAGNOSIS — Z992 Dependence on renal dialysis: Secondary | ICD-10-CM

## 2021-12-18 DIAGNOSIS — N186 End stage renal disease: Secondary | ICD-10-CM | POA: Diagnosis not present

## 2021-12-18 DIAGNOSIS — R2681 Unsteadiness on feet: Secondary | ICD-10-CM | POA: Diagnosis not present

## 2021-12-18 DIAGNOSIS — R39198 Other difficulties with micturition: Secondary | ICD-10-CM

## 2021-12-18 DIAGNOSIS — R300 Dysuria: Secondary | ICD-10-CM

## 2021-12-18 DIAGNOSIS — R3916 Straining to void: Secondary | ICD-10-CM

## 2021-12-18 DIAGNOSIS — N401 Enlarged prostate with lower urinary tract symptoms: Secondary | ICD-10-CM

## 2021-12-18 NOTE — Patient Instructions (Signed)
Good to see you today - Thank you for coming in!  Things we discussed today:  1) Take Torsemide 2 pills in the morning and 1 pill at night on non-dialysis days. Do NOT take Torsemide on Dialysis days (Tues, Thurs, Sat).  2) Continue monitoring your glucose in the morning and at night. If it is below 100, eat a piece of candy.  3) Your urination issues are most likely due to prostate enlargement. Please come back to see [place] if you notice pain or burning with urination.  Please always bring your medication bottles  Come back to see me within the next month to follow-up on urination and medication management.

## 2021-12-19 DIAGNOSIS — E162 Hypoglycemia, unspecified: Secondary | ICD-10-CM | POA: Insufficient documentation

## 2021-12-19 DIAGNOSIS — N400 Enlarged prostate without lower urinary tract symptoms: Secondary | ICD-10-CM | POA: Insufficient documentation

## 2021-12-19 NOTE — Assessment & Plan Note (Signed)
BG of 50s recorded on CGM. Denies feeling symptomatic during. Not currently on insulin or meds. Pt keeps candy for when he feels hypoglycemic. Also takes cinnamon supplements during hypoglycemic episodes, discussed importance of also eating sugar in addition.  - Plan to check glucose BID (AM and PM). If any readings under 100, eat a piece of candy. - At f/u, if he is maintain higher BG, can decrease cutoff for eating candy

## 2021-12-19 NOTE — Assessment & Plan Note (Signed)
Has hypotension after dialysis sessions. Was previously taking torsemide on dialysis days. Pt at increased risk of falls given unsteady gait and need for walker. Pt may also struggle with medication management, it seems that his daughter sometimes helps but may have made a mistake in the past. Pt is not currently interested in home health. Loss of independence seems to be a stressor. - Torsemide twice in AM and 1 in PM. Do not take on dialysis days (T, Th, Sat).  - Ask about blister pack options at f/u visit

## 2021-12-19 NOTE — Assessment & Plan Note (Addendum)
Reports nocturia and straining with urination. Denies incontinence or dysuria. Unable to obtain U/A today. Pt reports using saw palmetto in the past for urinary straining with improvement but stopped due to cost.  - reattempt UA at f/u to r/o UTI - consider prostate exam and med management at next visit
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
## 2021-12-22 ENCOUNTER — Other Ambulatory Visit: Payer: Self-pay

## 2021-12-22 ENCOUNTER — Other Ambulatory Visit (HOSPITAL_COMMUNITY): Payer: Self-pay

## 2021-12-22 MED ORDER — TAMSULOSIN HCL 0.4 MG PO CAPS
ORAL_CAPSULE | ORAL | 11 refills | Status: DC
  Filled 2021-12-22: qty 30, 30d supply, fill #0

## 2021-12-25 ENCOUNTER — Telehealth: Payer: Self-pay

## 2021-12-25 NOTE — Telephone Encounter (Signed)
Patients wife calls nurse reporting fatigue and weakness post dialysis.   Wife reports over the last ~2 weeks he has "fallen out" once he gets home from dialysis. Wife reports he has fallen 2x trying to go to the bathroom over the 2 week period. Wife reports he gets up and feels dizzy and falls. No loss of consciousness or injury. She reports he feels better ~ 1 hour after dialysis. Events occur only on dialysis days.   Patient does not take furosemide on dialysis days. Wife unsure of sugar levels or BP during events.   Wife advised to monitor closely. ED precautions given.   Peyton forward to PCP for advisement.

## 2021-12-29 ENCOUNTER — Other Ambulatory Visit (HOSPITAL_COMMUNITY): Payer: Self-pay

## 2021-12-31 ENCOUNTER — Other Ambulatory Visit: Payer: Self-pay

## 2021-12-31 MED ORDER — AMIODARONE HCL 200 MG PO TABS
ORAL_TABLET | ORAL | 6 refills | Status: DC
  Filled 2021-12-31: qty 60, 30d supply, fill #0

## 2021-12-31 MED ORDER — CARVEDILOL 25 MG PO TABS
ORAL_TABLET | ORAL | 6 refills | Status: DC
  Filled 2021-12-31: qty 60, 30d supply, fill #0

## 2021-12-31 MED ORDER — ELIQUIS 5 MG PO TABS
ORAL_TABLET | ORAL | 5 refills | Status: DC
Start: 2021-12-31 — End: 2022-05-26
  Filled 2021-12-31: qty 60, 30d supply, fill #0

## 2021-12-31 MED ORDER — ELIQUIS 5 MG PO TABS
5.0000 mg | ORAL_TABLET | Freq: Two times a day (BID) | ORAL | 5 refills | Status: DC
Start: 2021-12-31 — End: 2022-05-26

## 2022-01-04 ENCOUNTER — Telehealth: Payer: Self-pay

## 2022-01-04 ENCOUNTER — Telehealth: Payer: Self-pay | Admitting: Family Medicine

## 2022-01-04 DIAGNOSIS — I509 Heart failure, unspecified: Secondary | ICD-10-CM

## 2022-01-04 DIAGNOSIS — R55 Syncope and collapse: Secondary | ICD-10-CM

## 2022-01-04 DIAGNOSIS — N186 End stage renal disease: Secondary | ICD-10-CM

## 2022-01-04 MED ORDER — ROLLATOR ULTRA-LIGHT MISC: 1.0000 [IU] | Freq: Once | 0 refills | Status: AC

## 2022-01-04 NOTE — Telephone Encounter (Signed)
Patients wife calls nurse line requesting an order for a rolator.   Wife reports the walker he has doesn't allow him to stop. [name] reports several instances where he has almost fallen over the handles.   Ned forward to PCP to place order if appropriate.   Once placed please route back to me so I can initiate process.

## 2022-01-04 NOTE — Telephone Encounter (Signed)
Dialysis center stopped his torsemide due to hypotension. He has been off for about a week now and reports feeling much better. Not having syncope.  - Plan to hold torsemide altogether. Discussed watching for worsening edema. Pt understands.  - Plan to have follow-up appointment in within the month. Pt Tanis figure out transportation and then call back to clinic to make appointment. - Prescription sent for rollator

## 2022-01-05 NOTE — Telephone Encounter (Signed)
Community message sent to [employer] for processing.   Kerrie await response.

## 2022-01-07 NOTE — Telephone Encounter (Signed)
Message received from [employer]:   "We do not see order in chart."   Please advise.   Can do DME other and describe desired DME in comments section.   Jb forward to PCP

## 2022-01-15 ENCOUNTER — Telehealth: Payer: Self-pay

## 2022-01-15 NOTE — Telephone Encounter (Signed)
Patients wife LVM on nurse line requesting a call back.   Attempted to call [name] back, however no answer or identifiable VM.

## 2022-01-28 ENCOUNTER — Other Ambulatory Visit: Payer: Self-pay | Admitting: *Deleted

## 2022-01-28 DIAGNOSIS — N186 End stage renal disease: Secondary | ICD-10-CM

## 2022-02-09 ENCOUNTER — Other Ambulatory Visit: Payer: Self-pay

## 2022-02-09 ENCOUNTER — Encounter: Payer: Self-pay | Admitting: Physician Assistant

## 2022-02-09 ENCOUNTER — Ambulatory Visit (HOSPITAL_COMMUNITY)
Admission: RE | Admit: 2022-02-09 | Discharge: 2022-02-09 | Disposition: A | Discharge status: Home / Self Care | Payer: Medicare (Managed Care) | Source: Ambulatory Visit | Attending: Vascular Surgery | Admitting: Vascular Surgery

## 2022-02-09 ENCOUNTER — Ambulatory Visit (INDEPENDENT_AMBULATORY_CARE_PROVIDER_SITE_OTHER): Payer: Medicare (Managed Care) | Admitting: Physician Assistant

## 2022-02-09 VITALS — BP 155/88 | HR 72 | Temp 97.8°F | Resp 20 | Ht 69.0 in | Wt 175.8 lb

## 2022-02-09 DIAGNOSIS — Z992 Dependence on renal dialysis: Secondary | ICD-10-CM

## 2022-02-09 DIAGNOSIS — N186 End stage renal disease: Secondary | ICD-10-CM | POA: Diagnosis not present

## 2022-02-09 MED ORDER — SODIUM CHLORIDE 0.9 % IV SOLN: 250.0000 mL | INTRAVENOUS | Status: DC | PRN

## 2022-02-09 MED ORDER — SODIUM CHLORIDE 0.9% FLUSH: 3.0000 mL | Freq: Two times a day (BID) | INTRAVENOUS | Status: DC

## 2022-02-09 MED ORDER — SODIUM CHLORIDE 0.9% FLUSH: 3.0000 mL | INTRAVENOUS | Status: DC | PRN

## 2022-02-09 NOTE — Progress Notes (Signed)
Office Note     CC:  follow up Requesting Provider:  Arlyce Dice, MD  HPI: [name]. is a 71 y.o. 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 [name] on 08/17/2021.  Patient states the dialysis center attempted to stick the fistula 1 time however was unsuccessful.  They have not yet attempted to access fistula again.  He is dialyzing via right IJ [facility] on a Tuesday Thursday Saturday schedule at the [address] location in [place].   Past Medical History:  Diagnosis Date   Acute respiratory failure with hypoxia ([place]) 07/28/2021   Cardiac arrest ([place]) 07/27/2021   Diabetes mellitus without complication (HCC)    Heart failure with reduced ejection fraction ([place])    Hypertension    Hyponatremia 04/14/2019   NSVT (nonsustained ventricular tachycardia) ([place]) 04/14/2019   Renal insufficiency 04/14/2019    Past Surgical History:  Procedure Laterality Date   AV FISTULA PLACEMENT Left 08/17/2021   Procedure: RADIOCEPHALIC ARTERIOVENOUS (AV)  LEFT ARM FISTULA CREATION;  Surgeon: [name], MD;  Location: [facility];  Service: Vascular;  Laterality: Left;   IR FLUORO GUIDE CV LINE RIGHT  08/05/2021   IR US GUIDE VASC ACCESS RIGHT  08/05/2021   RIGHT/LEFT HEART CATH AND CORONARY ANGIOGRAPHY N/A 04/16/2019   Procedure: RIGHT/LEFT HEART CATH AND CORONARY ANGIOGRAPHY;  Surgeon: [name], MD;  Location: [facility];  Service: Cardiovascular;  Laterality: N/A;    Social History   Socioeconomic History   Marital status: Divorced    Spouse name: Not on file   Number of children: Not on file   Years of education: Not on file   Highest education level: Not on file  Occupational History   Not on file  Tobacco Use   Smoking status: Unknown   Smokeless tobacco: Never  Substance and Sexual Activity   Alcohol use: No   Drug use: No   Sexual activity: Not on file  Other Topics Concern   Not on file  Social  History Narrative   ** Merged History Encounter **       Lives with son. Previously worked for [employer].    Social Determinants of Health   Financial Resource Strain: Not on file  Food Insecurity: Not on file  Transportation Needs: Not on file  Physical Activity: Not on file  Stress: Not on file  Social Connections: Not on file  Intimate Partner Violence: Not on file    Family History  Problem Relation Age of Onset   Diabetes Mother    Diabetes Father    Congestive Heart Failure Sister    Alcohol abuse Sister    Heart failure Mother    Heart failure Brother    Heart attack Brother     Current Outpatient Medications  Medication Sig Dispense Refill   acetaminophen (TYLENOL) 325 MG tablet Take 2 tablets (650 mg total) by mouth 3 (three) times daily. 200 tablet 0   amiodarone (PACERONE) 200 MG tablet NEW PRESCRIPTION REQUEST: AMIODARONE 200MG - TAKE ONE TABLET BY MOUTH TWICE DAILY 180 tablet 3   amiodarone (PACERONE) 200 MG tablet Take 1 tablet by mouth twice a day as directed 60 tablet 6   apixaban (ELIQUIS) 5 MG TABS tablet NEW PRESCRIPTION REQUEST: ELIQUIS 5MG - TAKE ONE TABLET BY MOUTH TWICE DAILY 180 tablet 3   apixaban (ELIQUIS) 5 MG TABS tablet Take 1 tablet by mouth twice a day take 1  tablet 2 times daily 60 tablet 5   apixaban (ELIQUIS) 5 MG TABS tablet Take 1 tablet by mouth twice a day 60 tablet 5   Ascorbic Acid (VITAMIN C PO) Take 1 tablet by mouth daily.     b complex vitamins tablet Take 1 tablet by mouth daily.     Biotin 1000 MCG tablet Take 1,000 mcg by mouth daily.     blood glucose meter kit and supplies KIT Dispense based on patient and insurance preference. Use to check blood sugars at least twice a day --before meals and/or at bedtime. 1 each 0   carvedilol (COREG) 25 MG tablet NEW PRESCRIPTION REQUEST: CARVEDILOL 25MG - TAKE ONE TABLET BY MOUTH TWICE DAILY 180 tablet 3   carvedilol (COREG) 25 MG tablet Take 1 tablet by mouth twice a day as  directed 60 tablet 6   Continuous Blood Gluc Sensor (FREESTYLE LIBRE 14 DAY SENSOR) MISC 1 application. by Does not apply route every 14 (fourteen) days. 2 each 3   Ferrous Gluconate 239 (27 Fe) MG TABS Take 1 tablet by mouth daily.     food thickener (SIMPLYTHICK, NECTAR/LEVEL 2/MILDLY THICK,) GEL Take 10 packets by mouth as needed. 300 packet 0   guaiFENesin (MUCINEX) 600 MG 12 hr tablet Take 1 tablet (600 mg total) by mouth 2 (two) times daily. 60 tablet 0   Insulin Pen Needle 32G X 4 MM MISC Use with insulin 100 each 0   LANTUS 100 UNIT/ML injection NEW PRESCRIPTION REQUEST: INJECT 10 UNITS UNDER THE SKIN (SUBCUTANEOUSLY) AT BEDTIME 30 mL 3   lidocaine (LIDODERM) 5 % NEW PRESCRIPTION REQUEST: LIDIOCAINE FIVE% PATCH - PLACE ONE PATCH TOPICALLY TO SKIN FOR 12 HOURS ON, 12 HOURS OFF 90 patch 3   Menthol-Methyl Salicylate (MUSCLE RUB) 10-15 % CREA Apply 1 application. topically as needed for muscle pain. 85 g 0   methocarbamol (ROBAXIN) 500 MG tablet NEW PRESCRIPTION REQUEST: METHOCARBAMOL 500MG - TAKE ONE TABLET BY MOUTH DAILY AS NEEDED 90 tablet 3   multivitamin (RENA-VIT) TABS tablet TAKE 1 TABLET BY MOUTH AT BEDTIME 30 tablet 0   oxyCODONE-acetaminophen (PERCOCET/ROXICET) 5-325 MG tablet Take 1 tablet by mouth every 8 (eight) hours as needed for moderate pain. 27 tablet 0   tamsulosin (FLOMAX) 0.4 MG CAPS capsule Take 1 capsule by mouth every night 30 capsule 11   terbinafine (LAMISIL) 250 MG tablet NEW PRESCRIPTION REQUEST: TERBINAFINE 250MG - TAKE ONE TABLET BY MOUTH DAILY 90 tablet 3   Tetrahydrozoline HCl (VISINE OP) Place 1 drop into both eyes daily as needed (dry eyes).     torsemide (DEMADEX) 20 MG tablet TAKE 2 TABLETS BY MOUTH IN THE MORNING. TAKE 1 TABLET IN THE EVENING. DO NOT TAKE ON DIALYSIS DAYS (TUES, THURS, SAT). 270 tablet 3   traZODone (DESYREL) 50 MG tablet NEW PRESCRIPTION REQUEST: TRAZODONE 50MG - TAKE ONE TABLET BY MOUTH DAILY AS NEEDED 90 tablet 3   Zinc 50 MG TABS Take 50  mg by mouth daily.     No current facility-administered medications for this visit.    Allergies  Allergen Reactions   Lisinopril Swelling    Angioedema to lisinopril documented in chart. Time of reaction unknown.    Lisinopril Swelling    Lip swelling   Other Other (See Comments)    Seasonal allergies     REVIEW OF SYSTEMS:   '[X]'  denotes positive finding, '[ ]'  denotes negative finding Cardiac  Comments:  Chest pain or chest pressure:    Shortness  of breath upon exertion:    Short of breath when lying flat:    Irregular heart rhythm:        Vascular    Pain in calf, thigh, or hip brought on by ambulation:    Pain in feet at night that wakes you up from your sleep:     Blood clot in your veins:    Leg swelling:         Pulmonary    Oxygen at home:    Productive cough:     Wheezing:         Neurologic    Sudden weakness in arms or legs:     Sudden numbness in arms or legs:     Sudden onset of difficulty speaking or slurred speech:    Temporary loss of vision in one eye:     Problems with dizziness:         Gastrointestinal    Blood in stool:     Vomited blood:         Genitourinary    Burning when urinating:     Blood in urine:        Psychiatric    Major depression:         Hematologic    Bleeding problems:    Problems with blood clotting too easily:        Skin    Rashes or ulcers:        Constitutional    Fever or chills:      PHYSICAL EXAMINATION:  Vitals:   02/09/22 1237  BP: (!) 155/88  Pulse: 72  Resp: 20  Temp: 97.8 F (36.6 C)  TempSrc: Temporal  SpO2: 96%  Weight: 175 lb 12.8 oz (79.7 kg)  Height: '5\' 9"'  (1.753 m)    General:  WDWN in NAD; vital signs documented above Gait: Not observed HENT: WNL, normocephalic Pulmonary: normal non-labored breathing , without Rales, rhonchi,  wheezing Cardiac: regular HR Abdomen: soft, NT, no masses Skin: without rashes Vascular Exam/Pulses: Palpable thrill near the arterial anastomosis;  fistula then becomes pulsatile at the area where it was unsuccessfully cannulated in the mid forearm; large side branch in the distal forearm Musculoskeletal: no muscle wasting or atrophy  Neurologic: A&O X 3;  No focal weakness or paresthesias are detected Psychiatric:  The pt has Normal affect.   Non-Invasive Vascular Imaging:   Patent left radiocephalic fistula with high velocity near the anastomosis and low flow volume throughout the forearm    ASSESSMENT/PLAN:: 71 y.o. 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  Unfortunately, beyond this area it is pulsatile with low flow volume by duplex.  Plan Jearl be for left arm fistulogram with possible intervention.  Based on this procedure, we can then make a decision on sidebranch ligation or conversion to upper arm fistula.  This was discussed with the patient and he agrees to proceed.  We [place] try to find a nondialysis day in the near future to schedule the fistulogram.   Dagoberto Ligas, PA-C [facility] [phone]  Clinic MD:   [name]

## 2022-02-15 NOTE — Addendum Note (Signed)
Addended by: Nicholas Lose on: 02/15/2022 09:47 AM   Modules accepted: Orders

## 2022-02-18 ENCOUNTER — Other Ambulatory Visit: Payer: Self-pay

## 2022-02-18 MED ORDER — SEVELAMER CARBONATE 800 MG PO TABS
800.0000 mg | ORAL_TABLET | Freq: Three times a day (TID) | ORAL | 11 refills | Status: DC
  Filled 2022-02-18: qty 90, 30d supply, fill #0

## 2022-02-24 ENCOUNTER — Ambulatory Visit (HOSPITAL_COMMUNITY)
Admission: RE | Admit: 2022-02-24 | Discharge: 2022-02-24 | Disposition: A | Discharge status: Home / Self Care | Payer: Medicare (Managed Care) | Attending: Vascular Surgery | Admitting: Vascular Surgery

## 2022-02-24 ENCOUNTER — Encounter (HOSPITAL_COMMUNITY)
Admission: RE | Disposition: A | Discharge status: Home / Self Care | Payer: Self-pay | Source: Home / Self Care | Attending: Vascular Surgery

## 2022-02-24 ENCOUNTER — Other Ambulatory Visit: Payer: Self-pay

## 2022-02-24 DIAGNOSIS — T82898A Other specified complication of vascular prosthetic devices, implants and grafts, initial encounter: Secondary | ICD-10-CM | POA: Insufficient documentation

## 2022-02-24 DIAGNOSIS — Y841 Kidney dialysis as the cause of abnormal reaction of the patient, or of later complication, without mention of misadventure at the time of the procedure: Secondary | ICD-10-CM | POA: Diagnosis not present

## 2022-02-24 DIAGNOSIS — N186 End stage renal disease: Secondary | ICD-10-CM | POA: Insufficient documentation

## 2022-02-24 DIAGNOSIS — Z992 Dependence on renal dialysis: Secondary | ICD-10-CM | POA: Insufficient documentation

## 2022-02-24 HISTORY — PX: A/V FISTULAGRAM: CATH118298

## 2022-02-24 LAB — POCT I-STAT, CHEM 8
BUN: 61 mg/dL — ABNORMAL HIGH (ref 8–23)
Calcium, Ion: 1.01 mmol/L — ABNORMAL LOW (ref 1.15–1.40)
Chloride: 98 mmol/L (ref 98–111)
Creatinine, Ser: 6.4 mg/dL — ABNORMAL HIGH (ref 0.61–1.24)
Glucose, Bld: 139 mg/dL — ABNORMAL HIGH (ref 70–99)
HCT: 36 % — ABNORMAL LOW (ref 39.0–52.0)
Hemoglobin: 12.2 g/dL — ABNORMAL LOW (ref 13.0–17.0)
Potassium: 4.7 mmol/L (ref 3.5–5.1)
Sodium: 136 mmol/L (ref 135–145)
TCO2: 30 mmol/L (ref 22–32)

## 2022-02-24 SURGERY — A/V FISTULAGRAM
Anesthesia: LOCAL | Laterality: Left

## 2022-02-24 MED ORDER — LIDOCAINE HCL (PF) 1 % IJ SOLN
INTRAMUSCULAR | Status: DC | PRN
  Administered 2022-02-24: 5 mL

## 2022-02-24 MED ORDER — HEPARIN (PORCINE) IN NACL 1000-0.9 UT/500ML-% IV SOLN
INTRAVENOUS | Status: DC | PRN
  Administered 2022-02-24: 500 mL

## 2022-02-24 MED ORDER — IODIXANOL 320 MG/ML IV SOLN
INTRAVENOUS | Status: DC | PRN
  Administered 2022-02-24: 25 mL

## 2022-02-24 SURGICAL SUPPLY — 9 items
BAG SNAP BAND KOVER 36X36 (MISCELLANEOUS) ×1 IMPLANT
COVER DOME SNAP 22 D (MISCELLANEOUS) ×1 IMPLANT
KIT MICROPUNCTURE NIT STIFF (SHEATH) IMPLANT
PROTECTION STATION PRESSURIZED (MISCELLANEOUS) ×1 IMPLANT
SHEATH PROBE COVER 6X72 (BAG) ×1 IMPLANT
STATION PROTECTION PRESSURIZED (MISCELLANEOUS) ×1 IMPLANT
STOPCOCK MORSE 400PSI 3WAY (MISCELLANEOUS) ×1 IMPLANT
TRAY PV CATH (CUSTOM PROCEDURE TRAY) ×1 IMPLANT
TUBING CIL FLEX 10 FLL-RA (TUBING) ×1 IMPLANT

## 2022-02-24 NOTE — H&P (Signed)
Office Note   Patient seen and examined in preop holding.  No complaints. No changes to medication history or physical exam since last seen in clinic. After discussing the risks and benefits of left arm fistulagram, [name]. elected to proceed.   Lucas John MD    CC:  follow up Requesting Provider:  [name], MD  HPI: [name]. is a 71 y.o. 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 Carlis Adams on 08/17/2021.  Patient states the dialysis center attempted to stick the fistula 1 time however was unsuccessful.  They have not yet attempted to access fistula again.  He is dialyzing via right IJ [facility] on a Tuesday Thursday Saturday schedule at the [address] location in [place].   Past Medical History:  Diagnosis Date   Acute respiratory failure with hypoxia ([place]) 07/28/2021   Cardiac arrest ([place]) 07/27/2021   Diabetes mellitus without complication (HCC)    Heart failure with reduced ejection fraction ([place])    Hypertension    Hyponatremia 04/14/2019   NSVT (nonsustained ventricular tachycardia) ([place]) 04/14/2019   Renal insufficiency 04/14/2019    Past Surgical History:  Procedure Laterality Date   AV FISTULA PLACEMENT Left 08/17/2021   Procedure: RADIOCEPHALIC ARTERIOVENOUS (AV)  LEFT ARM FISTULA CREATION;  Surgeon: [name], MD;  Location: [facility];  Service: Vascular;  Laterality: Left;   IR FLUORO GUIDE CV LINE RIGHT  08/05/2021   IR US GUIDE VASC ACCESS RIGHT  08/05/2021   RIGHT/LEFT HEART CATH AND CORONARY ANGIOGRAPHY N/A 04/16/2019   Procedure: RIGHT/LEFT HEART CATH AND CORONARY ANGIOGRAPHY;  Surgeon: [name], MD;  Location: [facility];  Service: Cardiovascular;  Laterality: N/A;    Social History   Socioeconomic History   Marital status: Divorced    Spouse name: Not on file   Number of children: Not on file   Years of education: Not on file   Highest education  level: Not on file  Occupational History   Not on file  Tobacco Use   Smoking status: Unknown   Smokeless tobacco: Never  Substance and Sexual Activity   Alcohol use: No   Drug use: No   Sexual activity: Not on file  Other Topics Concern   Not on file  Social History Narrative   ** Merged History Encounter **       Lives with son. Previously worked for [employer].    Social Determinants of Health   Financial Resource Strain: Not on file  Food Insecurity: Not on file  Transportation Needs: Not on file  Physical Activity: Not on file  Stress: Not on file  Social Connections: Not on file  Intimate Partner Violence: Not on file    Family History  Problem Relation Age of Onset   Diabetes Mother    Diabetes Father    Congestive Heart Failure Sister    Alcohol abuse Sister    Heart failure Mother    Heart failure Brother    Heart attack Brother     No current facility-administered medications for this encounter.    Allergies  Allergen Reactions   Lisinopril Swelling    Angioedema to lisinopril documented in chart. Time of reaction unknown.    Lisinopril Swelling    Lip swelling   Other Other (See Comments)    Seasonal allergies     REVIEW OF SYSTEMS:   [X]  denotes positive finding, [ ]  denotes  negative finding Cardiac  Comments:  Chest pain or chest pressure:    Shortness of breath upon exertion:    Short of breath when lying flat:    Irregular heart rhythm:        Vascular    Pain in calf, thigh, or hip brought on by ambulation:    Pain in feet at night that wakes you up from your sleep:     Blood clot in your veins:    Leg swelling:         Pulmonary    Oxygen at home:    Productive cough:     Wheezing:         Neurologic    Sudden weakness in arms or legs:     Sudden numbness in arms or legs:     Sudden onset of difficulty speaking or slurred speech:    Temporary loss of vision in one eye:     Problems with dizziness:          Gastrointestinal    Blood in stool:     Vomited blood:         Genitourinary    Burning when urinating:     Blood in urine:        Psychiatric    Major depression:         Hematologic    Bleeding problems:    Problems with blood clotting too easily:        Skin    Rashes or ulcers:        Constitutional    Fever or chills:      PHYSICAL EXAMINATION:  Vitals:   02/24/22 0945  BP: (!) 174/90  Pulse: 85  Resp: 20  Temp: 97.6 F (36.4 C)  TempSrc: Temporal  SpO2: 98%  Weight: 75.8 kg  Height: 5\' 9"  (1.753 m)    General:  WDWN in NAD; vital signs documented above Gait: Not observed HENT: WNL, normocephalic Pulmonary: normal non-labored breathing , without Rales, rhonchi,  wheezing Cardiac: regular HR Abdomen: soft, NT, no masses Skin: without rashes Vascular Exam/Pulses: Palpable thrill near the arterial anastomosis; fistula then becomes pulsatile at the area where it was unsuccessfully cannulated in the mid forearm; large side branch in the distal forearm Musculoskeletal: no muscle wasting or atrophy  Neurologic: A&O X 3;  No focal weakness or paresthesias are detected Psychiatric:  The pt has Normal affect.   Non-Invasive Vascular Imaging:   Patent left radiocephalic fistula with high velocity near the anastomosis and low flow volume throughout the forearm    ASSESSMENT/PLAN:: 71 y.o. 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  Unfortunately, beyond this area it is pulsatile with low flow volume by duplex.  Plan Manish be for left arm fistulogram with possible intervention.  Based on this procedure, we can then make a decision on sidebranch ligation or conversion to upper arm fistula.  This was discussed with the patient and he agrees to proceed.  We [place] try to find a nondialysis day in the near future to schedule the fistulogram.   Lucas John,  PA-C [facility] [phone]  Clinic MD:   Carlis Adams

## 2022-02-24 NOTE — Op Note (Addendum)
    Patient name: [name]. MRN: [id number] DOB: 01-17-1951 Sex: male  02/24/2022 Pre-operative Diagnosis: End-stage renal disease Post-operative diagnosis:  Same Surgeon:  [name], MD Procedure Performed: 1.  Ultrasound-guided micropuncture access to the left arm radiocephalic fistula 2.  Fistulogram  Indications: Patient is a 71 year old male with end-stage renal disease who underwent left-sided radiocephalic fistula creation in April of this year.  Unfortunately, this is failed to mature.  After discussing the risk and benefits of left arm fistulogram in an effort to assess etiologies of poor maturation, and possibly improve luminal diameter with balloon assisted maturation, [name] elected to proceed.  Findings:  Widely patent arterial anastomosis on duplex ultrasound Small cephalic vein throughout the forearm, the 5 French micro sheath was occlusive roughly 1 cm from the arterial anastomosis Multiple branches along the cephalic vein course in the forearm Cephalic vein in the upper arm not visualized No outflow stenosis  At the time of sheath pull, there was no flow appreciated in the fistula distally - The 5 French micropuncture sheath likely thrombosed the fistula.    Procedure:  The patient was identified in the holding area and taken to room 8.  The patient was then placed supine on the table and prepped and draped in the usual sterile fashion.  A time out was called.   Ultrasound was used to evaluate the left arm radiocephalic fistula.  The outflow vein was small, but appeared to be sizable enough for ultrasound-guided access.  Once accessed, the 5 [place] micropuncture sheath was found to be occlusive.  Fistulogram followed, see results above.  The micro sheath was removed.  The proximal portion of the fistula remained pulsatile.    Impression: Radiocephalic fistula with small cephalic vein in the forearm, not amenable to dialysis access.  Gray need new access. [name]  asked to see his nephrologist prior to new access as he thought his kidneys were getting better. [name] schedule follow up in one month.   Cassandria Santee, MD [facility] Office: [phone]

## 2022-02-25 ENCOUNTER — Encounter (HOSPITAL_COMMUNITY): Payer: Self-pay | Admitting: Vascular Surgery

## 2022-03-25 ENCOUNTER — Other Ambulatory Visit: Payer: Self-pay | Admitting: *Deleted

## 2022-03-25 DIAGNOSIS — N186 End stage renal disease: Secondary | ICD-10-CM

## 2022-04-02 ENCOUNTER — Ambulatory Visit (HOSPITAL_COMMUNITY)
Admission: RE | Admit: 2022-04-02 | Discharge: 2022-04-02 | Disposition: A | Discharge status: Home / Self Care | Payer: Medicare (Managed Care) | Source: Ambulatory Visit | Attending: Vascular Surgery | Admitting: Vascular Surgery

## 2022-04-02 ENCOUNTER — Ambulatory Visit (INDEPENDENT_AMBULATORY_CARE_PROVIDER_SITE_OTHER): Payer: Medicare (Managed Care) | Admitting: Vascular Surgery

## 2022-04-02 ENCOUNTER — Encounter: Payer: Self-pay | Admitting: Vascular Surgery

## 2022-04-02 ENCOUNTER — Other Ambulatory Visit: Payer: Self-pay | Admitting: *Deleted

## 2022-04-02 ENCOUNTER — Ambulatory Visit (INDEPENDENT_AMBULATORY_CARE_PROVIDER_SITE_OTHER)
Admission: RE | Admit: 2022-04-02 | Discharge: 2022-04-02 | Disposition: A | Discharge status: Home / Self Care | Payer: Medicare (Managed Care) | Source: Ambulatory Visit | Attending: Vascular Surgery | Admitting: Vascular Surgery

## 2022-04-02 ENCOUNTER — Encounter: Payer: Self-pay | Admitting: *Deleted

## 2022-04-02 VITALS — BP 193/106 | HR 73 | Temp 97.9°F | Resp 20 | Ht 69.0 in | Wt 180.0 lb

## 2022-04-02 DIAGNOSIS — Z992 Dependence on renal dialysis: Secondary | ICD-10-CM | POA: Insufficient documentation

## 2022-04-02 DIAGNOSIS — N186 End stage renal disease: Secondary | ICD-10-CM | POA: Insufficient documentation

## 2022-04-05 NOTE — Progress Notes (Signed)
Office Note     CC:  follow up Requesting Provider:  Arlyce Dice, MD  HPI: [name]. is a 71 y.o. 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 [name] on 08/17/2021.  Since last seen in the office he has undergone fistulogram for attempted balloon assisted maturation.  This was unsuccessful.  He denies symptoms of steal syndrome, and would like functioning long-term HD access.  He is dialyzing via right [facility] on a Tuesday Thursday Saturday schedule at the [address] location in [place].   Past Medical History:  Diagnosis Date   Acute respiratory failure with hypoxia ([place]) 07/28/2021   Cardiac arrest ([employer]) 07/27/2021   Diabetes mellitus without complication (HCC)    Heart failure with reduced ejection fraction ([place])    Hypertension    Hyponatremia 04/14/2019   NSVT (nonsustained ventricular tachycardia) ([place]) 04/14/2019   Renal insufficiency 04/14/2019    Past Surgical History:  Procedure Laterality Date   A/V FISTULAGRAM Left 02/24/2022   Procedure: A/V Fistulagram;  Surgeon: [name], MD;  Location: [facility];  Service: Cardiovascular;  Laterality: Left;   AV FISTULA PLACEMENT Left 08/17/2021   Procedure: RADIOCEPHALIC ARTERIOVENOUS (AV)  LEFT ARM FISTULA CREATION;  Surgeon: [name], MD;  Location: [facility];  Service: Vascular;  Laterality: Left;   IR FLUORO GUIDE CV LINE RIGHT  08/05/2021   IR US GUIDE VASC ACCESS RIGHT  08/05/2021   RIGHT/LEFT HEART CATH AND CORONARY ANGIOGRAPHY N/A 04/16/2019   Procedure: RIGHT/LEFT HEART CATH AND CORONARY ANGIOGRAPHY;  Surgeon: [name], MD;  Location: [facility];  Service: Cardiovascular;  Laterality: N/A;    Social History   Socioeconomic History   Marital status: Divorced    Spouse name: Not on file   Number of children: Not on file   Years of education: Not on file   Highest education level: Not on file  Occupational  History   Not on file  Tobacco Use   Smoking status: Unknown   Smokeless tobacco: Never  Substance and Sexual Activity   Alcohol use: No   Drug use: No   Sexual activity: Not on file  Other Topics Concern   Not on file  Social History Narrative   ** Merged History Encounter **       Lives with son. Previously worked for city [employer].    Social Determinants of Health   Financial Resource Strain: Not on file  Food Insecurity: Not on file  Transportation Needs: Not on file  Physical Activity: Not on file  Stress: Not on file  Social Connections: Not on file  Intimate Partner Violence: Not on file    Family History  Problem Relation Age of Onset   Diabetes Mother    Diabetes Father    Congestive Heart Failure Sister    Alcohol abuse Sister    Heart failure Mother    Heart failure Brother    Heart attack Brother     Current Outpatient Medications  Medication Sig Dispense Refill   apixaban (ELIQUIS) 5 MG TABS tablet Take 1 tablet by mouth twice a day 60 tablet 5   Ascorbic Acid (VITAMIN C PO) Take 1 tablet by mouth daily.     b complex vitamins tablet Take 1 tablet by mouth daily.     Biotin 1000 MCG tablet Take 1,000 mcg by mouth daily.     blood glucose meter kit and supplies  KIT Dispense based on patient and insurance preference. Use to check blood sugars at least twice a day --before meals and/or at bedtime. 1 each 0   carvedilol (COREG) 25 MG tablet NEW PRESCRIPTION REQUEST: CARVEDILOL 25MG - TAKE ONE TABLET BY MOUTH TWICE DAILY 180 tablet 3   carvedilol (COREG) 25 MG tablet Take 1 tablet by mouth twice a day as directed 60 tablet 6   Continuous Blood Gluc Sensor (FREESTYLE LIBRE 14 DAY SENSOR) MISC 1 application. by Does not apply route every 14 (fourteen) days. 2 each 3   Ferrous Gluconate 239 (27 Fe) MG TABS Take 1 tablet by mouth daily.     food thickener (SIMPLYTHICK, NECTAR/LEVEL 2/MILDLY THICK,) GEL Take 10 packets by mouth as needed. 300 packet 0    guaiFENesin (MUCINEX) 600 MG 12 hr tablet Take 1 tablet (600 mg total) by mouth 2 (two) times daily. 60 tablet 0   Insulin Pen Needle 32G X 4 MM MISC Use with insulin 100 each 0   LANTUS 100 UNIT/ML injection NEW PRESCRIPTION REQUEST: INJECT 10 UNITS UNDER THE SKIN (SUBCUTANEOUSLY) AT BEDTIME 30 mL 3   lidocaine (LIDODERM) 5 % NEW PRESCRIPTION REQUEST: LIDIOCAINE FIVE% PATCH - PLACE ONE PATCH TOPICALLY TO SKIN FOR 12 HOURS ON, 12 HOURS OFF 90 patch 3   Menthol-Methyl Salicylate (MUSCLE RUB) 10-15 % CREA Apply 1 application. topically as needed for muscle pain. 85 g 0   methocarbamol (ROBAXIN) 500 MG tablet NEW PRESCRIPTION REQUEST: METHOCARBAMOL 500MG - TAKE ONE TABLET BY MOUTH DAILY AS NEEDED 90 tablet 3   multivitamin (RENA-VIT) TABS tablet TAKE 1 TABLET BY MOUTH AT BEDTIME 30 tablet 0   oxyCODONE-acetaminophen (PERCOCET/ROXICET) 5-325 MG tablet Take 1 tablet by mouth every 8 (eight) hours as needed for moderate pain. 27 tablet 0   saw palmetto 160 MG capsule Take 160 mg by mouth daily.     sevelamer carbonate (RENVELA) 800 MG tablet Take 1 tablet by mouth three times a day with meals 90 tablet 11   tamsulosin (FLOMAX) 0.4 MG CAPS capsule Take 1 capsule by mouth every night 30 capsule 11   terbinafine (LAMISIL) 250 MG tablet NEW PRESCRIPTION REQUEST: TERBINAFINE 250MG - TAKE ONE TABLET BY MOUTH DAILY 90 tablet 3   Tetrahydrozoline HCl (VISINE OP) Place 1 drop into both eyes daily as needed (dry eyes).     torsemide (DEMADEX) 20 MG tablet TAKE 2 TABLETS BY MOUTH IN THE MORNING. TAKE 1 TABLET IN THE EVENING. DO NOT TAKE ON DIALYSIS DAYS (TUES, THURS, SAT). (Patient taking differently: Take 20-40 mg by mouth daily as needed (fluid). TAKE 2 TABLETS BY MOUTH IN THE MORNING. TAKE 1 TABLET IN THE EVENING. DO NOT TAKE ON DIALYSIS DAYS (TUES, THURS, SAT).) 270 tablet 3   traZODone (DESYREL) 50 MG tablet NEW PRESCRIPTION REQUEST: TRAZODONE 50MG - TAKE ONE TABLET BY MOUTH DAILY AS NEEDED (Patient taking  differently: Take 50 mg by mouth at bedtime as needed for sleep.) 90 tablet 3   Zinc 50 MG TABS Take 50 mg by mouth daily.     acetaminophen (TYLENOL) 325 MG tablet Take 2 tablets (650 mg total) by mouth 3 (three) times daily. (Patient not taking: Reported on 02/19/2022) 200 tablet 0   amiodarone (PACERONE) 200 MG tablet NEW PRESCRIPTION REQUEST: AMIODARONE 200MG - TAKE ONE TABLET BY MOUTH TWICE DAILY (Patient not taking: Reported on 02/19/2022) 180 tablet 3   amiodarone (PACERONE) 200 MG tablet Take 1 tablet by mouth twice a day as directed (Patient not  taking: Reported on 04/02/2022) 60 tablet 6   apixaban (ELIQUIS) 5 MG TABS tablet NEW PRESCRIPTION REQUEST: ELIQUIS 5MG - TAKE ONE TABLET BY MOUTH TWICE DAILY (Patient not taking: Reported on 02/19/2022) 180 tablet 3   apixaban (ELIQUIS) 5 MG TABS tablet Take 1 tablet by mouth twice a day take 1 tablet 2 times daily (Patient not taking: Reported on 02/19/2022) 60 tablet 5   Current Facility-Administered Medications  Medication Dose Route Frequency Provider Last Rate Last Admin   0.9 %  sodium chloride infusion  250 mL Intravenous PRN Angelia Mould, MD       sodium chloride flush (NS) 0.9 % injection 3 mL  3 mL Intravenous PRN Angelia Mould, MD        Allergies  Allergen Reactions   Lisinopril Swelling    Angioedema to lisinopril documented in chart. Time of reaction unknown.    Lisinopril Swelling    Lip swelling   Other Other (See Comments)    Seasonal allergies     REVIEW OF SYSTEMS:   _0  denotes positive finding, _1  denotes negative finding Cardiac  Comments:  Chest pain or chest pressure:    Shortness of breath upon exertion:    Short of breath when lying flat:    Irregular heart rhythm:        Vascular    Pain in calf, thigh, or hip brought on by ambulation:    Pain in feet at night that wakes you up from your sleep:     Blood clot in your veins:    Leg swelling:         Pulmonary    Oxygen at home:     Productive cough:     Wheezing:         Neurologic    Sudden weakness in arms or legs:     Sudden numbness in arms or legs:     Sudden onset of difficulty speaking or slurred speech:    Temporary loss of vision in one eye:     Problems with dizziness:         Gastrointestinal    Blood in stool:     Vomited blood:         Genitourinary    Burning when urinating:     Blood in urine:        Psychiatric    Major depression:         Hematologic    Bleeding problems:    Problems with blood clotting too easily:        Skin    Rashes or ulcers:        Constitutional    Fever or chills:      PHYSICAL EXAMINATION:  Vitals:   04/02/22 1104  BP: (!) 193/106  Pulse: 73  Resp: 20  Temp: 97.9 F (36.6 C)  SpO2: 99%  Weight: 180 lb (81.6 kg)  Height: _2  (1.753 m)    General:  WDWN in NAD; vital signs documented above Gait: Not observed HENT: WNL, normocephalic Pulmonary: normal non-labored breathing , without Rales, rhonchi,  wheezing Cardiac: regular HR Abdomen: soft, NT, no masses Skin: without rashes Vascular Exam/Pulses: Palpable thrill near the arterial anastomosis; fistula then becomes pulsatile at the area where it was unsuccessfully cannulated in the mid forearm; large side branch in the distal forearm Musculoskeletal: no muscle wasting or atrophy  Neurologic: A&O X 3;  No focal weakness or paresthesias are detected Psychiatric:  The pt  has Normal affect.   Non-Invasive Vascular Imaging:   Patent left radiocephalic fistula with high velocity near the anastomosis and low flow volume throughout the forearm    ASSESSMENT/PLAN:: 71 y.o. 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  Being that this is failed to mature s/p fistulogram, [name] would be best treated with new HD access.  Vein mapping was reviewed demonstrating sizable cephalic  and basilic veins in the right upper extremity.  After discussing the risk and benefits of both brachiocephalic fistula versus brachiobasilic fistula, understanding that the brachiobasilic fistula would require a second stage, [name] elected to proceed with surgery.  My plan Edvardo be to evaluate both the cephalic and basilic veins with an ultrasound intraoperatively.  Using the cephalic vein would be optimal.   [name], MD [facility] [phone]  Clinic MD:   [name]

## 2022-04-06 NOTE — Progress Notes (Addendum)
Mr. Lenard Simmer called and stated that he just got home from dialysis and he is tired.  Patient also said that He has been told what to do and take. I  explained to patient that there is more to the call than those things and that we can call back on a non dialysis day.  Mr. Routh said that he Tennis be going to dialysis again on Wednesday and Saturday this week. I said that someone can call back on Friday, patient said that would be good. Mr Laski called back and asked when he needs to stop Eliquis, I  checked the letter from [facility], it states last dose of Eliquis is to be 04/08/22- Thursday. I asked patient to call the pharmacy or answer that call when they call , the medications need to be updated before we can give instructions. I did tell Mr. Sites to not take any more Vitamins until after surgery

## 2022-04-07 ENCOUNTER — Telehealth: Payer: Self-pay

## 2022-04-07 ENCOUNTER — Other Ambulatory Visit: Payer: Self-pay

## 2022-04-07 ENCOUNTER — Encounter (HOSPITAL_COMMUNITY): Payer: Self-pay | Admitting: Physician Assistant

## 2022-04-07 MED ORDER — AMLODIPINE BESYLATE 2.5 MG PO TABS
2.5000 mg | ORAL_TABLET | Freq: Every evening | ORAL | 3 refills | Status: DC
  Filled 2022-04-07: qty 90, 90d supply, fill #0

## 2022-04-07 NOTE — Telephone Encounter (Signed)
Late entry from 10:31 AM. Received a call from from Karoline Caldwell, [place] earlier today advising that patient has well known heart failure and was in the hospital for cardiac arrest in the Spring, but failed to follow up with cardiology/Dr. Haroldine Laws post discharge and that patient would need cardiac clearance. Faxed clearance request to Dr. Clayborne Dana office and Dr. [name] made aware.    Due to no response regarding clearance, I attempted to reach patient on multiple attempts to postpone surgery but no answer with voicemail full and non-working home number.

## 2022-04-12 ENCOUNTER — Ambulatory Visit (HOSPITAL_COMMUNITY)
Admission: RE | Admit: 2022-04-12 | Payer: Medicare (Managed Care) | Source: Home / Self Care | Admitting: Vascular Surgery

## 2022-04-12 SURGERY — INSERTION OF ARTERIOVENOUS (AV) GORE-TEX GRAFT ARM
Anesthesia: Monitor Anesthesia Care | Laterality: Right

## 2022-04-14 ENCOUNTER — Other Ambulatory Visit: Payer: Self-pay

## 2022-04-20 ENCOUNTER — Other Ambulatory Visit: Payer: Self-pay

## 2022-04-20 MED ORDER — AMLODIPINE BESYLATE 2.5 MG PO TABS
2.5000 mg | ORAL_TABLET | Freq: Every evening | ORAL | 3 refills | Status: DC
  Filled 2022-04-20: qty 90, 90d supply, fill #0

## 2022-04-26 ENCOUNTER — Other Ambulatory Visit: Payer: Self-pay

## 2022-05-12 ENCOUNTER — Ambulatory Visit: Payer: Medicare (Managed Care) | Attending: Interventional Cardiology | Admitting: Interventional Cardiology

## 2022-05-12 VITALS — BP 126/60 | HR 85 | Ht 69.0 in | Wt 175.0 lb

## 2022-05-12 DIAGNOSIS — Z992 Dependence on renal dialysis: Secondary | ICD-10-CM

## 2022-05-12 DIAGNOSIS — I480 Paroxysmal atrial fibrillation: Secondary | ICD-10-CM

## 2022-05-12 DIAGNOSIS — N186 End stage renal disease: Secondary | ICD-10-CM | POA: Diagnosis not present

## 2022-05-12 DIAGNOSIS — I5020 Unspecified systolic (congestive) heart failure: Secondary | ICD-10-CM | POA: Diagnosis not present

## 2022-05-12 DIAGNOSIS — Z01810 Encounter for preprocedural cardiovascular examination: Secondary | ICD-10-CM | POA: Diagnosis not present

## 2022-05-12 NOTE — Patient Instructions (Signed)
Medication Instructions:  Your physician recommends that you continue on your current medications as directed. Please refer to the Current Medication list given to you today.  *If you need a refill on your cardiac medications before your next appointment, please call your pharmacy*   Lab Work: none If you have labs (blood work) drawn today and your tests are completely normal, you [place] receive your results only by: [place] (if you have MyChart) OR A paper copy in the mail If you have any lab test that is abnormal or we need to change your treatment, we [place] call you to review the results.   Testing/Procedures: none   Follow-Up: At [facility], you and your health needs are our priority.  As part of our continuing mission to provide you with exceptional heart care, we have created designated Provider Care Teams.  These Care Teams include your primary Cardiologist (physician) and Advanced Practice Providers (APPs -  Physician Assistants and Nurse Practitioners) who all work together to provide you with the care you need, when you need it.  We recommend signing up for the patient portal called "MyChart".  Sign up information is provided on this After Visit Summary.  MyChart is used to connect with patients for Virtual Visits (Telemedicine).  Patients are able to view lab/test results, encounter notes, upcoming appointments, etc.  Non-urgent messages can be sent to your provider as well.   To learn more about what you can do with MyChart, go to [email or web address].    Your next appointment:   2-3 months  The format for your next appointment:   In Person  Provider:   CHF clinic   Other Instructions  Please schedule CHF clinic appointment for 2-3 months from now.  Patient is an established patient Follow up as needed with Dr Irish Lack  Important Information About Sugar

## 2022-05-12 NOTE — Progress Notes (Signed)
Cardiology Office Note   Date:  05/12/2022   ID:  Lucas Jorge., DOB March 25, 1951, MRN [id number]  PCP:  Arlyce Dice, MD    No chief complaint on file.  CHF/ AFib  Wt Readings from Last 3 Encounters:  05/12/22 175 lb (79.4 kg)  04/02/22 180 lb (81.6 kg)  02/24/22 167 lb (75.8 kg)       History of Present Illness: Lucas Ashleigh Arya. is a 71 y.o. male with a known nonischemic cardiomyopathy.  He was seen in the heart failure clinic in 2021 with records showing: "w/ h/o chronic systolic HF 2/2 NICM, L9JQ, HTN, Stage III CKD and h/o poor noncompliance due to poor financial situation. Has h/o angioedema w/ lisinopril.    Per chart review, he was diagnosed w/ HF in 03/2018. Echo showed EF of 25-30%. He was referred to [facility] by Dr. Claudie Leach for ischemic w/u. NST was arranged and showed apical artifact w/ possible peri-infarct ischemia involving the mid to distal inferior wall and septum. EF 24% by nuc study. Unfortunately, the patient did not return for additional f/u.    He was recently admitted to [facility] 03/2019 for acute CHF w/ volume overload. Was diuresed w/ IV lasix. R/LHC was preformed by Dr. Haroldine Laws w/ findings outlined below. Essentially normal coronaries, EF 30-35% c/w NICM and mild PAH due to high cardiac output, but otherwise normal hemodynamics. cMRi recommended as outpatient. He was placed on limited medical therapy. He is not on ACE/ARB/ARNI due to h/o angioedema. On coreg, spironolactone, Imdur, hydralazine and torsemide.    At last clinic visit, he was doing fairly well. NYHA II but BP was elevated. An SGLT2i was added to his regimen. We started Farxiga 10 mg daily. "  He was seen in the hospital by heart failure in March 2023 at the time of a cardiac arrest/cardiac arrest.  There is no plan for AICD.  He was started on dialysis at that time.  He was positive for COVID at that time.  He was diagnosed with atrial fibrillation and atrial flutter at that time.  Started on  Eliquis.   He needs a vascular procedure for dialysis access and needs cardiac clearance.   Back to exercising doing bands and floor exercises.  No SHOB or chest pain.  Walks 3 blocks and back, jogs in place, wihtout chest pain.  Denies : Chest pain. Dizziness. Leg edema. Nitroglycerin use. Orthopnea. Palpitations. Paroxysmal nocturnal dyspnea. Shortness of breath. Syncope.     Past Medical History:  Diagnosis Date   Acute respiratory failure with hypoxia ([place]) 07/28/2021   Cardiac arrest ([place]) 07/27/2021   Diabetes mellitus without complication (HCC)    Heart failure with reduced ejection fraction ([place])    Hypertension    Hyponatremia 04/14/2019   NSVT (nonsustained ventricular tachycardia) ([place]) 04/14/2019   Renal insufficiency 04/14/2019    Past Surgical History:  Procedure Laterality Date   A/V FISTULAGRAM Left 02/24/2022   Procedure: A/V Fistulagram;  Surgeon: [name], MD;  Location: [place] [facility];  Service: Cardiovascular;  Laterality: Left;   AV FISTULA PLACEMENT Left 08/17/2021   Procedure: RADIOCEPHALIC ARTERIOVENOUS (AV)  LEFT ARM FISTULA CREATION;  Surgeon: [name], MD;  Location: [place];  Service: Vascular;  Laterality: Left;   IR FLUORO GUIDE CV LINE RIGHT  08/05/2021   IR US GUIDE VASC ACCESS RIGHT  08/05/2021   RIGHT/LEFT HEART CATH AND CORONARY ANGIOGRAPHY N/A 04/16/2019   Procedure: RIGHT/LEFT HEART CATH AND CORONARY ANGIOGRAPHY;  Surgeon: [name], MD;  Location: [place] [facility];  Service: Cardiovascular;  Laterality: N/A;     Current Outpatient Medications  Medication Sig Dispense Refill   amiodarone (PACERONE) 200 MG tablet NEW PRESCRIPTION REQUEST: AMIODARONE 200MG - TAKE ONE TABLET BY MOUTH TWICE DAILY 180 tablet 3   apixaban (ELIQUIS) 5 MG TABS tablet Take 1 tablet by mouth twice a day take 1 tablet 2 times daily 60 tablet 5   Ascorbic Acid (VITAMIN C PO) Take 1 tablet by mouth daily.     b complex vitamins tablet Take 1  tablet by mouth daily.     blood glucose meter kit and supplies KIT Dispense based on patient and insurance preference. Use to check blood sugars at least twice a day --before meals and/or at bedtime. 1 each 0   carvedilol (COREG) 25 MG tablet NEW PRESCRIPTION REQUEST: CARVEDILOL 25MG - TAKE ONE TABLET BY MOUTH TWICE DAILY 180 tablet 3   Continuous Blood Gluc Sensor (FREESTYLE LIBRE 14 DAY SENSOR) MISC 1 application. by Does not apply route every 14 (fourteen) days. 2 each 3   multivitamin (RENA-VIT) TABS tablet TAKE 1 TABLET BY MOUTH AT BEDTIME 30 tablet 0   torsemide (DEMADEX) 20 MG tablet TAKE 2 TABLETS BY MOUTH IN THE MORNING. TAKE 1 TABLET IN THE EVENING. DO NOT TAKE ON DIALYSIS DAYS (TUES, THURS, SAT). (Patient taking differently: Take 20-40 mg by mouth daily as needed (fluid). TAKE 2 TABLETS BY MOUTH IN THE MORNING. TAKE 1 TABLET IN THE EVENING. DO NOT TAKE ON DIALYSIS DAYS (TUES, THURS, SAT).) 270 tablet 3   acetaminophen (TYLENOL) 325 MG tablet Take 2 tablets (650 mg total) by mouth 3 (three) times daily. (Patient not taking: Reported on 02/19/2022) 200 tablet 0   amiodarone (PACERONE) 200 MG tablet Take 1 tablet by mouth twice a day as directed (Patient not taking: Reported on 04/02/2022) 60 tablet 6   amLODipine (NORVASC) 2.5 MG tablet Take 1 tablet (2.5 mg total) by mouth Nightly. (Patient not taking: Reported on 05/12/2022) 90 tablet 3   amLODipine (NORVASC) 2.5 MG tablet Take 1 tablet (2.5 mg total) by mouth Nightly. (Patient not taking: Reported on 05/12/2022) 90 tablet 3   apixaban (ELIQUIS) 5 MG TABS tablet NEW PRESCRIPTION REQUEST: ELIQUIS 5MG - TAKE ONE TABLET BY MOUTH TWICE DAILY (Patient not taking: Reported on 02/19/2022) 180 tablet 3   apixaban (ELIQUIS) 5 MG TABS tablet Take 1 tablet by mouth twice a day (Patient not taking: Reported on 05/12/2022) 60 tablet 5   Biotin 1000 MCG tablet Take 1,000 mcg by mouth daily. (Patient not taking: Reported on 05/12/2022)     carvedilol (COREG)  25 MG tablet Take 1 tablet by mouth twice a day as directed (Patient not taking: Reported on 05/12/2022) 60 tablet 6   Ferrous Gluconate 239 (27 Fe) MG TABS Take 1 tablet by mouth daily. (Patient not taking: Reported on 05/12/2022)     food thickener (SIMPLYTHICK, NECTAR/LEVEL 2/MILDLY THICK,) GEL Take 10 packets by mouth as needed. (Patient not taking: Reported on 05/12/2022) 300 packet 0   guaiFENesin (MUCINEX) 600 MG 12 hr tablet Take 1 tablet (600 mg total) by mouth 2 (two) times daily. (Patient not taking: Reported on 05/12/2022) 60 tablet 0   Insulin Pen Needle 32G X 4 MM MISC Use with insulin (Patient not taking: Reported on 05/12/2022) 100 each 0   LANTUS 100 UNIT/ML injection NEW PRESCRIPTION REQUEST: INJECT 10 UNITS UNDER THE SKIN (SUBCUTANEOUSLY) AT BEDTIME (Patient not  taking: Reported on 05/12/2022) 30 mL 3   lidocaine (LIDODERM) 5 % NEW PRESCRIPTION REQUEST: LIDIOCAINE FIVE% PATCH - PLACE ONE PATCH TOPICALLY TO SKIN FOR 12 HOURS ON, 12 HOURS OFF (Patient not taking: Reported on 05/12/2022) 90 patch 3   Menthol-Methyl Salicylate (MUSCLE RUB) 10-15 % CREA Apply 1 application. topically as needed for muscle pain. (Patient not taking: Reported on 05/12/2022) 85 g 0   methocarbamol (ROBAXIN) 500 MG tablet NEW PRESCRIPTION REQUEST: METHOCARBAMOL 500MG - TAKE ONE TABLET BY MOUTH DAILY AS NEEDED (Patient not taking: Reported on 05/12/2022) 90 tablet 3   oxyCODONE-acetaminophen (PERCOCET/ROXICET) 5-325 MG tablet Take 1 tablet by mouth every 8 (eight) hours as needed for moderate pain. (Patient not taking: Reported on 05/12/2022) 27 tablet 0   saw palmetto 160 MG capsule Take 160 mg by mouth daily. (Patient not taking: Reported on 05/12/2022)     sevelamer carbonate (RENVELA) 800 MG tablet Take 1 tablet by mouth three times a day with meals (Patient not taking: Reported on 05/12/2022) 90 tablet 11   tamsulosin (FLOMAX) 0.4 MG CAPS capsule Take 1 capsule by mouth every night (Patient not taking:  Reported on 05/12/2022) 30 capsule 11   terbinafine (LAMISIL) 250 MG tablet NEW PRESCRIPTION REQUEST: TERBINAFINE 250MG - TAKE ONE TABLET BY MOUTH DAILY (Patient not taking: Reported on 05/12/2022) 90 tablet 3   Tetrahydrozoline HCl (VISINE OP) Place 1 drop into both eyes daily as needed (dry eyes). (Patient not taking: Reported on 05/12/2022)     traZODone (DESYREL) 50 MG tablet NEW PRESCRIPTION REQUEST: TRAZODONE 50MG - TAKE ONE TABLET BY MOUTH DAILY AS NEEDED (Patient not taking: Reported on 05/12/2022) 90 tablet 3   Zinc 50 MG TABS Take 50 mg by mouth daily. (Patient not taking: Reported on 05/12/2022)     Current Facility-Administered Medications  Medication Dose Route Frequency Provider Last Rate Last Admin   0.9 %  sodium chloride infusion  250 mL Intravenous PRN Angelia Mould, MD       sodium chloride flush (NS) 0.9 % injection 3 mL  3 mL Intravenous PRN Angelia Mould, MD        Allergies:   Lisinopril, Lisinopril, and Other    Social History:  The patient  reports that he has an unknown smoking status. He has never used smokeless tobacco. He reports that he does not drink alcohol and does not use drugs.   Family History:  The patient's family history includes Alcohol abuse in his sister; Congestive Heart Failure in his sister; Diabetes in his father and mother; Heart attack in his brother; Heart failure in his brother and mother.    ROS:  Please see the history of present illness.   Otherwise, review of systems are positive for still has some soreness where ribs were broken during CPR.   All other systems are reviewed and negative.    PHYSICAL EXAM: VS:  BP 126/60   Pulse 85   Ht _0  (1.753 m)   Wt 175 lb (79.4 kg)   SpO2 96%   BMI 25.84 kg/m  , BMI Body mass index is 25.84 kg/m. GEN: Well nourished, well developed, in no acute distress HEENT: normal Neck: no JVD, carotid bruits, or masses Cardiac: RRR; no murmurs, rubs, or gallops,no edema   Respiratory:  clear to auscultation bilaterally, normal work of breathing GI: soft, nontender, nondistended, + BS MS: no deformity or atrophy Skin: warm and dry, no rash Neuro:  Strength and sensation are intact Psych: euthymic  mood, full affect   EKG:   The ekg ordered today demonstrates NSR, PVC, nonspecific ST changes   Recent Labs: 07/27/2021: B Natriuretic Peptide 2,049.8 08/13/2021: Magnesium 2.0 08/20/2021: Platelets 197 10/23/2021: ALT 23; TSH 3.840 02/24/2022: BUN 61; Creatinine, Ser 6.40; Hemoglobin 12.2; Potassium 4.7; Sodium 136   Lipid Panel    Component Value Date/Time   CHOL 71 08/11/2021 0320   CHOL 167 12/07/2019 0857   TRIG 35 08/11/2021 0320   HDL 33 (L) 08/11/2021 0320   HDL 40 12/07/2019 0857   CHOLHDL 2.2 08/11/2021 0320   VLDL 7 08/11/2021 0320   LDLCALC 31 08/11/2021 0320   LDLCALC 109 (H) 12/07/2019 0857     Other studies Reviewed: Additional studies/ records that were reviewed today with results demonstrating: Hospital records reviewed.   ASSESSMENT AND PLAN:  HFrEF: Appears euvolemic.  FLuid managed with HD.  Would have him f/u with CHF clinic.   Continue carvedilol which he has been taking. PAF: in NSR.  COntinue Amio.  Eliqius for stroke prevention.  He denies bleeding issues other than some gum bleeding.  Watch for blood in stool. Preoperative cardiovascular exam: Cath in 2020 showed no CAD.  No further testing needed before vascular surgery.  Has several chronic medical issues, but surgical risks cannot be further lowered with cardiac intervention at this time.  ESRD: Dialysis 3x/week.    Current medicines are reviewed at length with the patient today.  The patient concerns regarding his medicines were addressed.  The following changes have been made:  No change  Labs/ tests ordered today include:  No orders of the defined types were placed in this encounter.   Recommend 150 minutes/week of aerobic exercise Low fat, low carb, high fiber  diet recommended  Disposition:   FU with CHF clinic in a few months   Signed, Larae Grooms, MD  05/12/2022 2:28 PM    [facility] [address] [place], [place],   [address] Phone: [phone]; Fax: [phone]

## 2022-05-19 ENCOUNTER — Other Ambulatory Visit: Payer: Self-pay

## 2022-05-19 DIAGNOSIS — N186 End stage renal disease: Secondary | ICD-10-CM

## 2022-05-19 NOTE — Telephone Encounter (Signed)
Patient cleared by cardiology to proceed with surgery. Spoke with patient and rescheduled him for 05/24/22. Instructions reviewed. Patient verbalized understanding and repeated back.

## 2022-05-20 ENCOUNTER — Encounter (HOSPITAL_COMMUNITY): Payer: Self-pay | Admitting: Vascular Surgery

## 2022-05-20 ENCOUNTER — Other Ambulatory Visit: Payer: Self-pay

## 2022-05-20 NOTE — Progress Notes (Signed)
Mr. [name] denies chest pain or shortness of breath. Patient denies having any s/s of Covid in his household, also denies any known exposure to Covid.   Mr. [name] stated that he Kainan take last dose of Eliquis tonight.  Mr [name]' Cardiologist is Dr. Johnsie Cancel.  Dr Irish Lack saw patient 05/12/22 and gave cardiac clearance.  Mr. Hufford has type II diabetes, he does not checK CBG.  I was giving Mr. [name] pre op hygiene instructions, patient became loud, he said "you have gone too far, nobody tells me how to take care of my body."  Mr. [name] ride [employer] bus to and from the hospital.  I asked who would stay with him for 24 hours after surgery.  Patient stated that he  "did not need anyone with him, no one has ever told him that." Mr [name] stated that no one had ever asked him so many  questions,"I am about  through talking to you."

## 2022-05-21 ENCOUNTER — Telehealth: Payer: Self-pay

## 2022-05-21 NOTE — Progress Notes (Signed)
[name], RN from Dr. [name]' office called back to confirm that she received the message about this pt riding the [employer] bus to and from the hospital, and that he [name] have No one to stay with him after surgery.

## 2022-05-21 NOTE — Telephone Encounter (Addendum)
Received notification from Lucas G., RN at [facility] that patient informed her that he was planning to ride the [place] bus to and from surgery, nor did he have anyone to stay with him afterwards.   Spoke with patient. He informed me, he [name] be riding the [employer] bus to the hospital but his daughter plans to take him home and stay with him after surgery.

## 2022-05-24 ENCOUNTER — Other Ambulatory Visit: Payer: Self-pay

## 2022-05-24 ENCOUNTER — Encounter (HOSPITAL_COMMUNITY)
Admission: RE | Disposition: A | Discharge status: Home / Self Care | Payer: Self-pay | Source: Home / Self Care | Attending: Vascular Surgery

## 2022-05-24 ENCOUNTER — Ambulatory Visit (HOSPITAL_COMMUNITY)
Admission: RE | Admit: 2022-05-24 | Discharge: 2022-05-24 | Disposition: A | Discharge status: Home / Self Care | Payer: Medicare (Managed Care) | Attending: Vascular Surgery | Admitting: Vascular Surgery

## 2022-05-24 ENCOUNTER — Ambulatory Visit (HOSPITAL_BASED_OUTPATIENT_CLINIC_OR_DEPARTMENT_OTHER): Payer: Medicare (Managed Care) | Admitting: Anesthesiology

## 2022-05-24 ENCOUNTER — Ambulatory Visit (HOSPITAL_COMMUNITY): Payer: Medicare (Managed Care) | Admitting: Anesthesiology

## 2022-05-24 ENCOUNTER — Encounter (HOSPITAL_COMMUNITY): Payer: Self-pay | Admitting: Vascular Surgery

## 2022-05-24 DIAGNOSIS — Z992 Dependence on renal dialysis: Secondary | ICD-10-CM | POA: Insufficient documentation

## 2022-05-24 DIAGNOSIS — I132 Hypertensive heart and chronic kidney disease with heart failure and with stage 5 chronic kidney disease, or end stage renal disease: Secondary | ICD-10-CM | POA: Diagnosis present

## 2022-05-24 DIAGNOSIS — I509 Heart failure, unspecified: Secondary | ICD-10-CM | POA: Diagnosis not present

## 2022-05-24 DIAGNOSIS — E1122 Type 2 diabetes mellitus with diabetic chronic kidney disease: Secondary | ICD-10-CM | POA: Insufficient documentation

## 2022-05-24 DIAGNOSIS — I4891 Unspecified atrial fibrillation: Secondary | ICD-10-CM | POA: Insufficient documentation

## 2022-05-24 DIAGNOSIS — Z79899 Other long term (current) drug therapy: Secondary | ICD-10-CM | POA: Diagnosis not present

## 2022-05-24 DIAGNOSIS — I471 Supraventricular tachycardia, unspecified: Secondary | ICD-10-CM | POA: Diagnosis not present

## 2022-05-24 DIAGNOSIS — Z794 Long term (current) use of insulin: Secondary | ICD-10-CM

## 2022-05-24 DIAGNOSIS — N186 End stage renal disease: Secondary | ICD-10-CM

## 2022-05-24 DIAGNOSIS — I5022 Chronic systolic (congestive) heart failure: Secondary | ICD-10-CM | POA: Insufficient documentation

## 2022-05-24 DIAGNOSIS — N185 Chronic kidney disease, stage 5: Secondary | ICD-10-CM

## 2022-05-24 HISTORY — PX: AV FISTULA PLACEMENT: SHX1204

## 2022-05-24 HISTORY — DX: Cardiac arrhythmia, unspecified: I49.9

## 2022-05-24 HISTORY — DX: Heart failure, unspecified: I50.9

## 2022-05-24 HISTORY — DX: Unspecified atrial fibrillation: I48.91

## 2022-05-24 LAB — POCT I-STAT, CHEM 8
BUN: 37 mg/dL — ABNORMAL HIGH (ref 8–23)
Calcium, Ion: 1.15 mmol/L (ref 1.15–1.40)
Chloride: 97 mmol/L — ABNORMAL LOW (ref 98–111)
Creatinine, Ser: 6.3 mg/dL — ABNORMAL HIGH (ref 0.61–1.24)
Glucose, Bld: 134 mg/dL — ABNORMAL HIGH (ref 70–99)
HCT: 33 % — ABNORMAL LOW (ref 39.0–52.0)
Hemoglobin: 11.2 g/dL — ABNORMAL LOW (ref 13.0–17.0)
Potassium: 3.6 mmol/L (ref 3.5–5.1)
Sodium: 138 mmol/L (ref 135–145)
TCO2: 27 mmol/L (ref 22–32)

## 2022-05-24 LAB — GLUCOSE, CAPILLARY
Glucose-Capillary: 125 mg/dL — ABNORMAL HIGH (ref 70–99)
Glucose-Capillary: 154 mg/dL — ABNORMAL HIGH (ref 70–99)

## 2022-05-24 SURGERY — INSERTION OF ARTERIOVENOUS (AV) GORE-TEX GRAFT ARM
Anesthesia: Monitor Anesthesia Care | Laterality: Right

## 2022-05-24 MED ORDER — EPHEDRINE 5 MG/ML INJ
INTRAVENOUS | Status: AC
  Filled 2022-05-24: qty 5

## 2022-05-24 MED ORDER — LIDOCAINE-EPINEPHRINE (PF) 1.5 %-1:200000 IJ SOLN
INTRAMUSCULAR | Status: DC | PRN
  Administered 2022-05-24: 30 mg via PERINEURAL

## 2022-05-24 MED ORDER — MIDAZOLAM HCL 2 MG/2ML IJ SOLN
INTRAMUSCULAR | Status: AC
  Filled 2022-05-24: qty 2

## 2022-05-24 MED ORDER — HEPARIN 6000 UNIT IRRIGATION SOLUTION
Status: AC
  Filled 2022-05-24: qty 500

## 2022-05-24 MED ORDER — LACTATED RINGERS IV SOLN: INTRAVENOUS | Status: DC

## 2022-05-24 MED ORDER — PROTAMINE SULFATE 10 MG/ML IV SOLN
INTRAVENOUS | Status: AC
  Filled 2022-05-24: qty 25

## 2022-05-24 MED ORDER — CARVEDILOL 12.5 MG PO TABS: 25.0000 mg | ORAL_TABLET | Freq: Once | ORAL | Status: AC

## 2022-05-24 MED ORDER — CEFAZOLIN SODIUM-DEXTROSE 2-4 GM/100ML-% IV SOLN
2.0000 g | INTRAVENOUS | Status: AC
  Administered 2022-05-24: 2 g via INTRAVENOUS
  Filled 2022-05-24: qty 100

## 2022-05-24 MED ORDER — ROCURONIUM BROMIDE 10 MG/ML (PF) SYRINGE
PREFILLED_SYRINGE | INTRAVENOUS | Status: AC
  Filled 2022-05-24: qty 10

## 2022-05-24 MED ORDER — ONDANSETRON HCL 4 MG/2ML IJ SOLN
INTRAMUSCULAR | Status: AC
  Filled 2022-05-24: qty 4

## 2022-05-24 MED ORDER — ONDANSETRON HCL 4 MG/2ML IJ SOLN
INTRAMUSCULAR | Status: DC | PRN
  Administered 2022-05-24: 4 mg via INTRAVENOUS

## 2022-05-24 MED ORDER — PROTAMINE SULFATE 10 MG/ML IV SOLN
INTRAVENOUS | Status: DC | PRN
  Administered 2022-05-24: 20 mg via INTRAVENOUS

## 2022-05-24 MED ORDER — ORAL CARE MOUTH RINSE: 15.0000 mL | Freq: Once | OROMUCOSAL | Status: AC

## 2022-05-24 MED ORDER — HEPARIN 6000 UNIT IRRIGATION SOLUTION
Status: DC | PRN
  Administered 2022-05-24: 1

## 2022-05-24 MED ORDER — 0.9 % SODIUM CHLORIDE (POUR BTL) OPTIME
TOPICAL | Status: DC | PRN
  Administered 2022-05-24: 1000 mL

## 2022-05-24 MED ORDER — MIDAZOLAM HCL 2 MG/2ML IJ SOLN
INTRAMUSCULAR | Status: DC | PRN
  Administered 2022-05-24 (×3): .5 mg via INTRAVENOUS

## 2022-05-24 MED ORDER — FENTANYL CITRATE (PF) 250 MCG/5ML IJ SOLN
INTRAMUSCULAR | Status: AC
  Filled 2022-05-24: qty 5

## 2022-05-24 MED ORDER — CHLORHEXIDINE GLUCONATE 4 % EX LIQD: 60.0000 mL | Freq: Once | CUTANEOUS | Status: DC

## 2022-05-24 MED ORDER — HEPARIN SODIUM (PORCINE) 1000 UNIT/ML IJ SOLN
INTRAMUSCULAR | Status: DC | PRN
  Administered 2022-05-24: 2000 [IU] via INTRAVENOUS
  Administered 2022-05-24: 3000 [IU] via INTRAVENOUS

## 2022-05-24 MED ORDER — CHLORHEXIDINE GLUCONATE 0.12 % MT SOLN
15.0000 mL | Freq: Once | OROMUCOSAL | Status: AC
  Administered 2022-05-24: 15 mL via OROMUCOSAL
  Filled 2022-05-24: qty 15

## 2022-05-24 MED ORDER — FENTANYL CITRATE (PF) 250 MCG/5ML IJ SOLN
INTRAMUSCULAR | Status: DC | PRN
  Administered 2022-05-24 (×3): 25 ug via INTRAVENOUS

## 2022-05-24 MED ORDER — OXYCODONE-ACETAMINOPHEN 5-325 MG PO TABS: 1.0000 | ORAL_TABLET | Freq: Four times a day (QID) | ORAL | 0 refills | Status: DC | PRN

## 2022-05-24 MED ORDER — ACETAMINOPHEN 500 MG PO TABS
1000.0000 mg | ORAL_TABLET | Freq: Once | ORAL | Status: AC
  Administered 2022-05-24: 1000 mg via ORAL
  Filled 2022-05-24: qty 2

## 2022-05-24 MED ORDER — LIDOCAINE 2% (20 MG/ML) 5 ML SYRINGE
INTRAMUSCULAR | Status: AC
  Filled 2022-05-24: qty 5

## 2022-05-24 MED ORDER — FENTANYL CITRATE (PF) 100 MCG/2ML IJ SOLN: 50.0000 ug | Freq: Once | INTRAMUSCULAR | Status: AC

## 2022-05-24 MED ORDER — INSULIN ASPART 100 UNIT/ML IJ SOLN: 0.0000 [IU] | INTRAMUSCULAR | Status: DC | PRN

## 2022-05-24 MED ORDER — FENTANYL CITRATE (PF) 100 MCG/2ML IJ SOLN
INTRAMUSCULAR | Status: AC
  Administered 2022-05-24: 50 ug via INTRAVENOUS
  Filled 2022-05-24: qty 2

## 2022-05-24 MED ORDER — PHENYLEPHRINE 80 MCG/ML (10ML) SYRINGE FOR IV PUSH (FOR BLOOD PRESSURE SUPPORT)
PREFILLED_SYRINGE | INTRAVENOUS | Status: AC
  Filled 2022-05-24: qty 10

## 2022-05-24 MED ORDER — CARVEDILOL 12.5 MG PO TABS
ORAL_TABLET | ORAL | Status: AC
  Administered 2022-05-24: 25 mg via ORAL
  Filled 2022-05-24: qty 2

## 2022-05-24 MED ORDER — LIDOCAINE HCL (PF) 1 % IJ SOLN
INTRAMUSCULAR | Status: AC
  Filled 2022-05-24: qty 30

## 2022-05-24 MED ORDER — SODIUM CHLORIDE 0.9 % IV SOLN: INTRAVENOUS | Status: DC

## 2022-05-24 MED ORDER — HEPARIN SODIUM (PORCINE) 1000 UNIT/ML IJ SOLN
INTRAMUSCULAR | Status: AC
  Filled 2022-05-24: qty 10

## 2022-05-24 SURGICAL SUPPLY — 36 items
ARMBAND PINK RESTRICT EXTREMIT (MISCELLANEOUS) ×1 IMPLANT
BAG COUNTER SPONGE SURGICOUNT (BAG) ×1 IMPLANT
BAG SPNG CNTER NS LX DISP (BAG) ×1
BLADE CLIPPER SURG (BLADE) ×1 IMPLANT
BNDG ELASTIC 4X5.8 VLCR STR LF (GAUZE/BANDAGES/DRESSINGS) ×1 IMPLANT
CANISTER SUCT 3000ML PPV (MISCELLANEOUS) ×1 IMPLANT
CLIP LIGATING EXTRA MED SLVR (CLIP) ×1 IMPLANT
CLIP LIGATING EXTRA SM BLUE (MISCELLANEOUS) ×1 IMPLANT
CLIP VESOCCLUDE MED 6/CT (CLIP) ×1 IMPLANT
COVER PROBE W GEL 5X96 (DRAPES) ×1 IMPLANT
DERMABOND ADVANCED .7 DNX12 (GAUZE/BANDAGES/DRESSINGS) ×1 IMPLANT
ELECT REM PT RETURN 9FT ADLT (ELECTROSURGICAL) ×1 IMPLANT
ELECTRODE REM PT RTRN 9FT ADLT (ELECTROSURGICAL) ×1 IMPLANT
GLOVE BIOGEL PI IND STRL 8 (GLOVE) ×1 IMPLANT
GOWN STRL REUS W/ TWL LRG LVL3 (GOWN DISPOSABLE) ×2 IMPLANT
GOWN STRL REUS W/TWL 2XL LVL3 (GOWN DISPOSABLE) ×2 IMPLANT
GOWN STRL REUS W/TWL LRG LVL3 (GOWN DISPOSABLE) ×3
KIT BASIN OR (CUSTOM PROCEDURE TRAY) ×1 IMPLANT
KIT TURNOVER KIT B (KITS) ×1 IMPLANT
NS IRRIG 1000ML POUR BTL (IV SOLUTION) ×1 IMPLANT
PACK CV ACCESS (CUSTOM PROCEDURE TRAY) ×1 IMPLANT
PAD ARMBOARD 7.5X6 YLW CONV (MISCELLANEOUS) ×2 IMPLANT
SLING ARM FOAM STRAP LRG (SOFTGOODS) IMPLANT
SLING ARM FOAM STRAP MED (SOFTGOODS) IMPLANT
SPIKE FLUID TRANSFER (MISCELLANEOUS) ×1 IMPLANT
SUT GORETEX 6.0 TT13 (SUTURE) IMPLANT
SUT MNCRL AB 4-0 PS2 18 (SUTURE) ×1 IMPLANT
SUT PROLENE 6 0 BV (SUTURE) ×1 IMPLANT
SUT PROLENE 7 0 BV 1 (SUTURE) IMPLANT
SUT SILK 2 0 PERMA HAND 18 BK (SUTURE) IMPLANT
SUT SILK 3 0 SH CR/8 (SUTURE) ×1 IMPLANT
SUT VIC AB 3-0 SH 27 (SUTURE) ×2
SUT VIC AB 3-0 SH 27X BRD (SUTURE) ×2 IMPLANT
TOWEL GREEN STERILE (TOWEL DISPOSABLE) ×1 IMPLANT
UNDERPAD 30X36 HEAVY ABSORB (UNDERPADS AND DIAPERS) ×1 IMPLANT
WATER STERILE IRR 1000ML POUR (IV SOLUTION) ×1 IMPLANT

## 2022-05-24 NOTE — Anesthesia Procedure Notes (Signed)
Anesthesia Regional Block: Supraclavicular block   Pre-Anesthetic Checklist: , timeout performed,  Correct Patient, Correct Site, Correct Laterality,  Correct Procedure, Correct Position, site marked,  Risks and benefits discussed,  Surgical consent,  Pre-op evaluation,  At surgeon's request and post-op pain management  Laterality: Right and Upper  Prep: chloraprep       Needles:  Injection technique: Single-shot  Needle Type: Echogenic Needle     Needle Length: 9cm  Needle Gauge: 21     Additional Needles:   Procedures:,,,, ultrasound used (permanent image in chart),,    Narrative:  Start time: 05/24/2022 8:52 AM End time: 05/24/2022 8:58 AM Injection made incrementally with aspirations every 5 mL.  Performed by: Personally  Anesthesiologist: Annye Asa, MD  Additional Notes: Pt identified in Holding room.  Monitors applied. Working IV access confirmed. Sterile prep R clavicle and neck.  #21ga ECHOgenic Arrow block needle to supraclavicular brachial plexus with US guidance.  30cc 1.5% Lidocaine 1:200k epi injected incrementally after negative test dose.  Patient asymptomatic, VSS, no heme aspirated, tolerated well.   Jenita Seashore, MD

## 2022-05-24 NOTE — Anesthesia Postprocedure Evaluation (Signed)
Anesthesia Post Note  Patient: [name].  Procedure(s) Performed: CREATION OF ARTERIOVENOUS (AV) RIGHT BRACHIOCEPHALIC FIRST STAGE FISTULA (Right)     Patient location during evaluation: PACU Anesthesia Type: Regional Level of consciousness: awake and alert, patient cooperative and oriented Pain management: pain level controlled Vital Signs Assessment: post-procedure vital signs reviewed and stable Respiratory status: nonlabored ventilation, spontaneous breathing and respiratory function stable Cardiovascular status: blood pressure returned to baseline and stable Postop Assessment: no apparent nausea or vomiting and able to ambulate Anesthetic complications: no   No notable events documented.  Last Vitals:  Vitals:   05/24/22 1105 05/24/22 1115  BP: (!) 146/84 (!) 161/90  Pulse: 67 69  Resp: (!) 21 17  Temp:    SpO2: 97% 97%    Last Pain:  Vitals:   05/24/22 1115  TempSrc:   PainSc: 0-No pain                 Cashlynn Yearwood,E. Hayla Hinger

## 2022-05-24 NOTE — Discharge Instructions (Addendum)
   [facility]  Discharge Instructions  AV Fistula or Graft Surgery for Dialysis Access  Please refer to the following instructions for your post-procedure care. Your surgeon or physician assistant Raghav discuss any changes with you.  Activity  You may drive the day following your surgery, if you are comfortable and no longer taking prescription pain medication. Resume full activity as the soreness in your incision resolves.  Bathing/Showering  You may shower after you go home. Keep your incision dry for 48 hours. Do not soak in a bathtub, hot tub, or swim until the incision heals completely. You may not shower if you have a hemodialysis catheter.  Incision Care  Clean your incision with mild soap and water after 48 hours. Pat the area dry with a clean towel. You do not need a bandage unless otherwise instructed. Do not apply any ointments or creams to your incision. You may have skin glue on your incision. Do not peel it off. It Chanc come off on its own in about one week. Your arm may swell a bit after surgery. To reduce swelling use pillows to elevate your arm so it is above your heart. Your doctor Briant tell you if you need to lightly wrap your arm with an ACE bandage.  Diet  Resume your normal diet. There are not special food restrictions following this procedure. In order to heal from your surgery, it is CRITICAL to get adequate nutrition. Your body requires vitamins, minerals, and protein. Vegetables are the best source of vitamins and minerals. Vegetables also provide the perfect balance of protein. Processed food has little nutritional value, so try to avoid this.  Medications  Resume taking all of your medications. If your incision is causing pain, you may take over-the counter pain relievers such as acetaminophen (Tylenol). If you were prescribed a stronger pain medication, please be aware these medications can cause nausea and constipation. Prevent  nausea by taking the medication with a snack or meal. Avoid constipation by drinking plenty of fluids and eating foods with high amount of fiber, such as fruits, vegetables, and grains.  Do not take Tylenol if you are taking prescription pain medications.  Resume Eliquis on 05/25/2022.  Follow up Your surgeon may want to see you in the office following your access surgery. If so, this Medardo be arranged at the time of your surgery.  Please call us immediately for any of the following conditions:  Increased pain, redness, drainage (pus) from your incision site Fever of 101 degrees or higher Severe or worsening pain at your incision site Hand pain or numbness.  Reduce your risk of vascular disease:  Stop smoking. If you would like help, call [facility] at 1-800-QUIT-NOW 650-387-7402) or [place] at [place] your cholesterol Maintain a desired weight Control your diabetes Keep your blood pressure down  Dialysis  It Tryone take several weeks to several months for your new dialysis access to be ready for use. Your surgeon Eamonn determine when it is okay to use it. Your nephrologist Hamza continue to direct your dialysis. You can continue to use your Permcath until your new access is ready for use.   05/24/2022 [name] [id number] 11/10/1950  Surgeon(s): [name], MD  Procedure(s): Creation first stage basilic vein transposition  x Do not stick fistula for 12 weeks    If you have any questions, please call the office at [phone].

## 2022-05-24 NOTE — Op Note (Signed)
    NAME: [name].    MRN: [id number] DOB: 04/09/1951    DATE OF OPERATION: 05/24/2022  PREOP DIAGNOSIS:    End-stage renal disease  POSTOP DIAGNOSIS:    Same  PROCEDURE:    Right arm for stage, brachiobasilic fistula  SURGEON: [name]  ASSIST: Leontine Locket, PA  ANESTHESIA: Block, moderate  EBL: 25 mL  INDICATIONS:    Lucas Jacquan Savas. is a 72 y.o. male with end-stage renal disease in need of long-term HD access.  Previous access was left radiocephalic fistula which failed to mature.  Recent imaging demonstrated adequate cephalic versus basilic vein in the right arm.  After discussing the risk and benefits of right arm fistula creation, [name].  FINDINGS:   Thin-walled, sclerotic cephalic vein 64mm Healthy basilic vein 72mm Brachial artery 5 mm  TECHNIQUE:   Patient was brought to the OR laid in supine position.  Moderate anesthesia was induced and the patient was prepped and draped in standard fashion.  The case began with ultrasound insonation of the right cephalic vein. This appeared of adequate size, and had some redundancy with what appeared to be some varicosities.  Due to its size, I thought it would make adequate conduit.  A transverse incision was made distal to the antecubital fossa and to the cephalic vein was mobilized, followed by mobilization and control of the right brachial artery using a vessel loop.  The cephalic vein was transected, followed by serial coronary dilators to 4.5 mm. These dilators went freely.  Next, heparinized saline was used.  Upon pressurization, there was a fluid collection in the distal bicep.  This concerning for vein injury.  A counterincision was made over this area and the vein was exposed.  The vein was extremely thin-walled with multiple, small venotomies.  I did not think these were fixable, and the quality artery was so poor, I did not think it would work as AV fistula conduit.  I therefore moved to  expose the basilic vein, which was adjacent to the brachial artery.  This was mobilized and transected with the distal aspect oversewn with 3-0 silk suture.  The brachial artery was opened through a longitudinal arteriotomy and the basilic vein was sewn in an end to side anastomosis using running 6-0 Prolene suture.  At completion of the fistula, there was an excellent thrill in the basilic vein with a palpable pulse at the wrist.  The proximal cephalic vein in the counterincision was ligated with 3-0 silk suture.  Wound was irrigated with copious amounts of saline and closed in layers using Vicryl with Monocryl and Dermabond at the level of the skin.  Macie Burows, MD [facility] DATE OF DICTATION:   05/24/2022

## 2022-05-24 NOTE — H&P (Signed)
Hospital H&P   History of Present Illness: This is a 72 y.o. male who presents with end-stage renal disease in need of long-term HD access.  He has previously had a left radiocephalic fistula created by Dr. [name] on 08/17/2021.  This has failed to mature.  After discussing the risk and benefits of new HD access, [name] elected to proceed.  On exam today, he was doing well.  He had no complaints.  Past Medical History:  Diagnosis Date   Acute respiratory failure with hypoxia ([place]) 07/28/2021   Atrial fibrillation ([place])    Cardiac arrest ([place]) 07/27/2021   CHF (congestive heart failure) ([place])    Diabetes mellitus without complication ([place])    Dysrhythmia    Heart failure with reduced ejection fraction ([place])    Hypertension    Hyponatremia 04/14/2019   NSVT (nonsustained ventricular tachycardia) ([place]) 04/14/2019   Pneumonia 07/2021   Renal insufficiency 04/14/2019    Past Surgical History:  Procedure Laterality Date   A/V FISTULAGRAM Left 02/24/2022   Procedure: A/V Fistulagram;  Surgeon: [name], MD;  Location: [facility];  Service: Cardiovascular;  Laterality: Left;   AV FISTULA PLACEMENT Left 08/17/2021   Procedure: RADIOCEPHALIC ARTERIOVENOUS (AV)  LEFT ARM FISTULA CREATION;  Surgeon: [name], MD;  Location: [facility];  Service: Vascular;  Laterality: Left;   IR FLUORO GUIDE CV LINE RIGHT  08/05/2021   IR US GUIDE VASC ACCESS RIGHT  08/05/2021   RIGHT/LEFT HEART CATH AND CORONARY ANGIOGRAPHY N/A 04/16/2019   Procedure: RIGHT/LEFT HEART CATH AND CORONARY ANGIOGRAPHY;  Surgeon: [name], MD;  Location: [facility];  Service: Cardiovascular;  Laterality: N/A;    Allergies  Allergen Reactions   Lisinopril Swelling    Angioedema to lisinopril documented in chart. Time of reaction unknown.    Lisinopril Swelling    Lip swelling   Other Other (See Comments)    Seasonal allergies    Prior to Admission medications   Medication Sig Start  Date End Date Taking? Authorizing Provider  amiodarone (PACERONE) 200 MG tablet Take 1 tablet by mouth twice a day as directed 12/31/21  Yes   apixaban (ELIQUIS) 5 MG TABS tablet NEW PRESCRIPTION REQUEST: ELIQUIS 5MG  - TAKE ONE TABLET BY MOUTH TWICE DAILY 10/26/21  Yes [name], Naaman Plummer, DO  Ascorbic Acid (VITAMIN C PO) Take 1 tablet by mouth daily.   Yes [provider]  b complex vitamins tablet Take 1 tablet by mouth daily.   Yes [provider]  Biotin 1000 MCG tablet Take 1,000 mcg by mouth daily.   Yes [provider]  carvedilol (COREG) 25 MG tablet NEW PRESCRIPTION REQUEST: CARVEDILOL 25MG  - TAKE ONE TABLET BY MOUTH TWICE DAILY 10/26/21  Yes [name], Naaman Plummer, DO  Cholecalciferol (VITAMIN D3) 50 MCG (2000 UT) capsule Take 2,000 Units by mouth daily.   Yes [provider]  Garlic 1610 MG CAPS Take 1,000 mg by mouth daily.   Yes [provider]  Menthol-Methyl Salicylate (MUSCLE RUB) 10-15 % CREA Apply 1 application. topically as needed for muscle pain. Patient taking differently: Apply 1 application  topically daily as needed for muscle pain (Chest pain). 08/21/21  Yes Love, Ivan Anchors, PA-C  Multiple Vitamins-Minerals (MULTIVITAMIN WITH MINERALS) tablet Take 1 tablet by mouth daily.   Yes [provider]  OVER THE COUNTER MEDICATION Take 800 mg by mouth daily. Kale   Yes [provider]  Prasterone, DHEA, (DHEA PO) Take 1 capsule by mouth  daily.   Yes [provider]  saw palmetto 160 MG capsule Take 160 mg by mouth daily.   Yes [provider]  sevelamer carbonate (RENVELA) 800 MG tablet Take 1 tablet by mouth three times a day with meals 02/18/22  Yes   acetaminophen (TYLENOL) 325 MG tablet Take 2 tablets (650 mg total) by mouth 3 (three) times daily. Patient not taking: Reported on 02/19/2022 08/21/21   Love, Ivan Anchors, PA-C  amiodarone (PACERONE) 200 MG tablet NEW PRESCRIPTION REQUEST: AMIODARONE 200MG  - TAKE ONE  TABLET BY MOUTH TWICE DAILY Patient not taking: Reported on 05/19/2022 09/28/21   [name], Naaman Plummer, DO  amLODipine (NORVASC) 2.5 MG tablet Take 1 tablet (2.5 mg total) by mouth Nightly. Patient not taking: Reported on 05/12/2022 04/07/22   Claudia Desanctis, MD  amLODipine (NORVASC) 2.5 MG tablet Take 1 tablet (2.5 mg total) by mouth Nightly. Patient not taking: Reported on 05/19/2022 04/20/22     apixaban (ELIQUIS) 5 MG TABS tablet Take 1 tablet by mouth twice a day take 1 tablet 2 times daily Patient not taking: Reported on 05/19/2022 12/31/21     apixaban (ELIQUIS) 5 MG TABS tablet Take 1 tablet by mouth twice a day Patient not taking: Reported on 05/12/2022 12/31/21     blood glucose meter kit and supplies KIT Dispense based on patient and insurance preference. Use to check blood sugars at least twice a day --before meals and/or at bedtime. 08/21/21   Love, Ivan Anchors, PA-C  carvedilol (COREG) 25 MG tablet Take 1 tablet by mouth twice a day as directed Patient not taking: Reported on 05/12/2022 12/31/21     Continuous Blood Gluc Sensor (FREESTYLE LIBRE 14 DAY SENSOR) MISC 1 application. by Does not apply route every 14 (fourteen) days. 10/06/21   [name], Naaman Plummer, DO  food thickener (SIMPLYTHICK, NECTAR/LEVEL 2/MILDLY THICK,) GEL Take 10 packets by mouth as needed. Patient not taking: Reported on 05/12/2022 08/21/21   Love, Ivan Anchors, PA-C  guaiFENesin (MUCINEX) 600 MG 12 hr tablet Take 1 tablet (600 mg total) by mouth 2 (two) times daily. Patient not taking: Reported on 05/12/2022 09/14/21   [name], Naaman Plummer, DO  Insulin Pen Needle 32G X 4 MM MISC Use with insulin Patient not taking: Reported on 05/12/2022 08/21/21   Love, Ivan Anchors, PA-C  LANTUS 100 UNIT/ML injection NEW PRESCRIPTION REQUEST: INJECT 10 UNITS UNDER THE SKIN (SUBCUTANEOUSLY) AT BEDTIME Patient not taking: Reported on 05/12/2022 09/28/21   [name], Naaman Plummer, DO  lidocaine (LIDODERM) 5 % NEW PRESCRIPTION REQUEST: LIDIOCAINE FIVE% PATCH - PLACE  ONE PATCH TOPICALLY TO SKIN FOR 12 HOURS ON, 12 HOURS OFF Patient not taking: Reported on 05/12/2022 09/28/21   [name], Naaman Plummer, DO  multivitamin (RENA-VIT) TABS tablet TAKE 1 TABLET BY MOUTH AT BEDTIME Patient not taking: Reported on 05/19/2022 10/26/21   [name], Naaman Plummer, DO  tamsulosin (FLOMAX) 0.4 MG CAPS capsule Take 1 capsule by mouth every night Patient not taking: Reported on 05/12/2022 12/22/21     terbinafine (LAMISIL) 250 MG tablet NEW PRESCRIPTION REQUEST: TERBINAFINE 250MG  - TAKE ONE TABLET BY MOUTH DAILY Patient not taking: Reported on 05/12/2022 09/28/21   [name], Naaman Plummer, DO  torsemide (DEMADEX) 20 MG tablet TAKE 2 TABLETS BY MOUTH IN THE MORNING. TAKE 1 TABLET IN THE EVENING. DO NOT TAKE ON DIALYSIS DAYS (TUES, THURS, SAT). Patient not taking: Reported on 05/19/2022 11/27/21   Arlyce Dice, MD  traZODone (DESYREL) 50 MG tablet NEW PRESCRIPTION REQUEST: TRAZODONE 50MG  - TAKE ONE TABLET BY MOUTH DAILY AS  NEEDED Patient not taking: Reported on 05/12/2022 09/28/21   [name], Naaman Plummer, DO    Social History   Socioeconomic History   Marital status: Divorced    Spouse name: Not on file   Number of children: Not on file   Years of education: Not on file   Highest education level: Not on file  Occupational History   Not on file  Tobacco Use   Smoking status: Never   Smokeless tobacco: Never  Vaping Use   Vaping Use: Never used  Substance and Sexual Activity   Alcohol use: Never   Drug use: No   Sexual activity: Not on file  Other Topics Concern   Not on file  Social History Narrative   ** Merged History Encounter **       Lives with son. Previously worked for city [employer].    Social Determinants of Health   Financial Resource Strain: Not on file  Food Insecurity: Not on file  Transportation Needs: Not on file  Physical Activity: Not on file  Stress: Not on file  Social Connections: Not on file  Intimate Partner Violence: Not on file   Family History   Problem Relation Age of Onset   Diabetes Mother    Diabetes Father    Congestive Heart Failure Sister    Alcohol abuse Sister    Heart failure Mother    Heart failure Brother    Heart attack Brother     ROS: Otherwise negative unless mentioned in HPI  Physical Examination  Vitals:   05/24/22 0911 05/24/22 0915  BP: (!) 194/123 (!) 183/98  Pulse: 78 75  Resp: (!) 27 (!) 36  Temp:    SpO2: 97% 96%   Body mass index is 25.85 kg/m.  General:  WDWN in NAD Gait: Not observed HENT: WNL, normocephalic Pulmonary: normal non-labored breathing, without Rales, rhonchi,  wheezing Cardiac: regular Abdomen: soft, NT/ND, no masses Skin: without rashes Vascular Exam/Pulses: 2+ radial Extremities: without ischemic changes, without Gangrene , without cellulitis; without open wounds;  Musculoskeletal: no muscle wasting or atrophy  Neurologic: A&O X 3;  No focal weakness or paresthesias are detected; speech is fluent/normal Psychiatric:  The pt has Normal affect. Lymph:  Unremarkable  CBC    Component Value Date/Time   WBC 3.4 (L) 08/20/2021 0500   RBC 2.61 (L) 08/20/2021 0500   HGB 11.2 (L) 05/24/2022 0849   HGB 9.4 (L) 06/17/2021 1429   HCT 33.0 (L) 05/24/2022 0849   HCT 30.0 (L) 06/17/2021 1429   PLT 197 08/20/2021 0500   PLT 192 06/17/2021 1429   MCV 90.4 08/20/2021 0500   MCV 89 06/17/2021 1429   MCH 27.6 08/20/2021 0500   MCHC 30.5 08/20/2021 0500   RDW 18.7 (H) 08/20/2021 0500   RDW 14.6 06/17/2021 1429   LYMPHSABS 0.9 08/20/2021 0500   LYMPHSABS 1.7 04/24/2021 1557   MONOABS 0.7 08/20/2021 0500   EOSABS 0.1 08/20/2021 0500   EOSABS 0.1 04/24/2021 1557   BASOSABS 0.0 08/20/2021 0500   BASOSABS 0.0 04/24/2021 1557    BMET    Component Value Date/Time   NA 138 05/24/2022 0849   NA 143 10/23/2021 1438   K 3.6 05/24/2022 0849   CL 97 (L) 05/24/2022 0849   CO2 26 10/23/2021 1438   GLUCOSE 134 (H) 05/24/2022 0849   BUN 37 (H) 05/24/2022 0849   BUN 31 (H)  10/23/2021 1438   CREATININE 6.30 (H) 05/24/2022 0849   CALCIUM 8.6 10/23/2021 1438  GFRNONAA 10 (L) 08/20/2021 0500   GFRAA 23 (L) 02/08/2020 0845    COAGS: Lab Results  Component Value Date   INR 1.5 (H) 07/27/2021      ASSESSMENT/PLAN: This is a 72 y.o. male who presents for new HD access.  Previous access being left-sided radiocephalic fistula found mature.  Prior vein mapping demonstrated adequate cephalic and basilic veins in the right upper extremity.  After discussing risks and benefits of right brachiocephalic fistula, we have elected to proceed.   Cassandria Santee MD [facility] [phone] 05/24/2022  9:23 AM

## 2022-05-24 NOTE — Progress Notes (Signed)
I notified Dr.Jackson of patient's BP 201/98 and patient did not take his Carvedilol at home this morning. Verbal order given by Dr.Jackson to give his at home dose of Carvedilol 25 mg po now.

## 2022-05-24 NOTE — Anesthesia Preprocedure Evaluation (Addendum)
Anesthesia Evaluation  Patient identified by MRN, date of birth, ID band Patient awake    Reviewed: Allergy & Precautions, NPO status , Patient's Chart, lab work & pertinent test results, reviewed documented beta blocker date and time   History of Anesthesia Complications Negative for: history of anesthetic complications  Airway Mallampati: II  TM Distance: >3 FB Neck ROM: Full    Dental  (+) Dental Advisory Given, Poor Dentition, Missing   Pulmonary neg pulmonary ROS   breath sounds clear to auscultation       Cardiovascular hypertension, Pt. on medications and Pt. on home beta blockers (-) angina +CHF  + dysrhythmias Atrial Fibrillation and Supra Ventricular Tachycardia  Rhythm:Irregular Rate:Normal  07/2021 ECHO: EF 30-35%. The LV has moderately decreased function, demonstrates global hypokinesis. The left ventricular internal cavity size was moderately dilated.  RVF is moderately reduced. The right ventricular size is moderately enlarged.  No significant valvular abnormalities    Neuro/Psych negative neurological ROS     GI/Hepatic negative GI ROS, Neg liver ROS,,,Swallowing issues have resolved   Endo/Other  diabetes (glu 125), Insulin Dependent    Renal/GU ESRF and DialysisRenal disease (K+ 3.6)     Musculoskeletal   Abdominal   Peds  Hematology eliquis   Anesthesia Other Findings   Reproductive/Obstetrics                              Anesthesia Physical Anesthesia Plan  ASA: 4  Anesthesia Plan: Regional and MAC   Post-op Pain Management: Tylenol PO (pre-op)*   Induction:   PONV Risk Score and Plan: 1 and Ondansetron and Treatment may vary due to age or medical condition  Airway Management Planned: Natural Airway and Simple Face Mask  Additional Equipment: None  Intra-op Plan:   Post-operative Plan:   Informed Consent: I have reviewed the patients History and  Physical, chart, labs and discussed the procedure including the risks, benefits and alternatives for the proposed anesthesia with the patient or authorized representative who has indicated his/her understanding and acceptance.     Dental advisory given  Plan Discussed with: CRNA and Surgeon  Anesthesia Plan Comments: (Plan routine monitors, Supraclavicular block)         Anesthesia Quick Evaluation

## 2022-05-24 NOTE — Transfer of Care (Signed)
Immediate Anesthesia Transfer of Care Note  Patient: [name].  Procedure(s) Performed: CREATION OF ARTERIOVENOUS (AV) RIGHT BRACHIOCEPHALIC FIRST STAGE FISTULA (Right)  Patient Location: PACU  Anesthesia Type:MAC and Regional  Level of Consciousness: awake, alert , and oriented  Airway & Oxygen Therapy: Patient Spontanous Breathing and Patient connected to nasal cannula oxygen  Post-op Assessment: Report given to RN and Post -op Vital signs reviewed and stable  Post vital signs: Reviewed and stable  Last Vitals:  Vitals Value Taken Time  BP 146/84 05/24/22 1104  Temp 36.3 C 05/24/22 1104  Pulse 70 05/24/22 1105  Resp 18 05/24/22 1105  SpO2 99 % 05/24/22 1105  Vitals shown include unvalidated device data.  Last Pain:  Vitals:   05/24/22 0915  TempSrc:   PainSc: 0-No pain         Complications: No notable events documented.

## 2022-05-25 ENCOUNTER — Encounter (HOSPITAL_COMMUNITY): Payer: Self-pay | Admitting: Vascular Surgery

## 2022-06-22 ENCOUNTER — Other Ambulatory Visit: Payer: Self-pay | Admitting: *Deleted

## 2022-06-22 DIAGNOSIS — N186 End stage renal disease: Secondary | ICD-10-CM

## 2022-06-25 ENCOUNTER — Ambulatory Visit (INDEPENDENT_AMBULATORY_CARE_PROVIDER_SITE_OTHER): Payer: Medicare (Managed Care) | Admitting: Family Medicine

## 2022-06-25 ENCOUNTER — Other Ambulatory Visit: Payer: Self-pay

## 2022-06-25 VITALS — BP 140/82 | HR 72 | Wt 180.2 lb

## 2022-06-25 DIAGNOSIS — R3916 Straining to void: Secondary | ICD-10-CM

## 2022-06-25 DIAGNOSIS — E162 Hypoglycemia, unspecified: Secondary | ICD-10-CM | POA: Diagnosis not present

## 2022-06-25 DIAGNOSIS — N186 End stage renal disease: Secondary | ICD-10-CM | POA: Diagnosis not present

## 2022-06-25 DIAGNOSIS — N401 Enlarged prostate with lower urinary tract symptoms: Secondary | ICD-10-CM | POA: Diagnosis not present

## 2022-06-25 DIAGNOSIS — R37 Sexual dysfunction, unspecified: Secondary | ICD-10-CM

## 2022-06-25 DIAGNOSIS — Z992 Dependence on renal dialysis: Secondary | ICD-10-CM

## 2022-06-25 DIAGNOSIS — Z7189 Other specified counseling: Secondary | ICD-10-CM

## 2022-06-25 NOTE — Progress Notes (Unsigned)
    SUBJECTIVE:   CHIEF COMPLAINT / HPI:   [name] is a 72yo M presenting for follow-up.  Weakness Pt previously was having issues with generalized weakness and hypotension after dialysis. Pt reports that they discovered he was drinking too much water and causing fluid overload. He then would have a lot of fluid taken off on dialysis days. He has decreased his fluid intake and dialysis adjustments were made. Torsemide also d/c'd. Pt reports that his weakness is now much improved! He is now able to walk well with his walker. Has more energy now, less tired. He also working out with resistance bands.   Hypoglycemia Previously struggled with hypoglycemia that required eating candy and glucose monitoring. He reports that he has been taking cinnamon and no longer having this issue. He reports home BG to be ~130s. Has not required eating candy to prevent hypoglycemia. Now limiting water intake.  Sexual Dysfunction Takes ashwaganda root and beet juice to help with libido. Reports that he sometimes has issues with maintaining/achieving erections and notes decreased semen production. Not currently sexually active. Still has spontaneous morning erections.  BPH Takes Saw palmetto for BPH, feels like he can sleep through the night without peeing now.  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 More active and mobile than last visit. Resp: CTAB. Normal WOB on RA CV: RRR Abm: Soft, nontender. Normal BS.  ASSESSMENT/PLAN:   ESRD on hemodialysis (HCC) Previously struggling w/ weakness after dialysis and hypotension. Also discovered that he was drinking too much fluids, then resulting in dialysis removing a lot of volume; I suspect that these big fluid shifts may have been contributing to pt's weakness months ago.  He has since decreased his fluid intake and dialysis adjustments made, torsemide discontinued. He reports that he feels much better and less weak, has more energy. - Moderate fluid intake  BPH (benign prostatic hyperplasia) Takes Saw Palmetto and reports that his urinary symptoms have improved. He is able to sleep through the night without waking up to pee. He is satisfied with his current control of his BPH.  - Reviewed saw palmetto. Limited evidence for or against. Shared decision making, pt prefers to continue taking this and is satisfied with the results.   Sexual dysfunction Reports sexual dysfunction including difficulty achieving erection, decreased semen volume, and libido. Not currently sexually active. Still able to have morning spontaneous erections. Discuss viagra but he is not interested at this time. He is interested in taking Ashwaganda root. Limited evidence for or against this supplement, discussed that I can not recommend it. - F/u in 1-2 months to discuss other options and discuss sexual dysfunction - Asked patient to keep me updated on supplements he takes  Hypoglycemia Reports his hypoglycemia episodes have resolved since starting cinnamon. Suspect that his fluid shift issues from dialysis (see above) may have contributed to this. Seems to have improved now. - Asked pt to bring in CGM and/or record glucose and bring to next visit  Encounter for herb and vitamin supplement management Pt interested in supplements including Ashwaganda root, saw palmetto, Vit E oil, cinnamon.  - Discussed potential risks of unregulated supplements and limited benefit.   Arlyce Dice, MD [place]

## 2022-06-25 NOTE — Patient Instructions (Signed)
Good to see you today - Thank you for coming in  Things we discussed today:  1) In terms of herbal supplements, there is not good evidence at this time for Ashwaganda root. If you choose to continue taking it, please let me know if you start having any side effects.  2) For your sexual symptoms, we can discuss treatment options at our next visit (including Viagra) and see which one may fit best with your lifestyle and symptoms.  3) Let us discuss getting a colonoscopy and other health recommendations at your next visit.  Please always bring your medication bottles  Come back to see me in 1-2 months.

## 2022-06-28 DIAGNOSIS — R37 Sexual dysfunction, unspecified: Secondary | ICD-10-CM | POA: Insufficient documentation

## 2022-06-28 DIAGNOSIS — Z7189 Other specified counseling: Secondary | ICD-10-CM | POA: Insufficient documentation

## 2022-06-28 NOTE — Assessment & Plan Note (Signed)
Reports sexual dysfunction including difficulty achieving erection, decreased semen volume, and libido. Not currently sexually active. Still able to have morning spontaneous erections. Discuss viagra but he is not interested at this time. He is interested in taking Ashwaganda root. Limited evidence for or against this supplement, discussed that I can not recommend it. - F/u in 1-2 months to discuss other options and discuss sexual dysfunction - Asked patient to keep me updated on supplements he takes

## 2022-06-28 NOTE — Assessment & Plan Note (Signed)
Previously struggling w/ weakness after dialysis and hypotension. Also discovered that he was drinking too much fluids, then resulting in dialysis removing a lot of volume; I suspect that these big fluid shifts may have been contributing to pt's weakness months ago. He has since decreased his fluid intake and dialysis adjustments made, torsemide discontinued. He reports that he feels much better and less weak, has more energy. - Moderate fluid intake

## 2022-06-28 NOTE — Assessment & Plan Note (Addendum)
Reports his hypoglycemia episodes have resolved since starting cinnamon. Suspect that his fluid shift issues from dialysis (see above) may have contributed to this. Seems to have improved now. - Asked pt to bring in CGM and/or record glucose and bring to next visit

## 2022-06-28 NOTE — Assessment & Plan Note (Signed)
Pt interested in supplements including Ashwaganda root, saw palmetto, Vit E oil, cinnamon.  - Discussed potential risks of unregulated supplements and limited benefit.

## 2022-06-28 NOTE — Assessment & Plan Note (Signed)
Takes [employer] and reports that his urinary symptoms have improved. He is able to sleep through the night without waking up to pee. He is satisfied with his current control of his BPH.  - Reviewed saw palmetto. Limited evidence for or against. Shared decision making, pt prefers to continue taking this and is satisfied with the results.

## 2022-07-08 NOTE — Progress Notes (Deleted)
POST OPERATIVE OFFICE NOTE    CC:  F/u for surgery  HPI:  This is a 72 y.o. male who is s/p right 1st stage BVT on 05/24/2022 by Dr. [name].  He has [place] that was placed by IR on 08/05/2021.   Dialysis access hx: -left RC AVF 08/17/2021 Dr. [name] -fistulogram left RC AVF 02/24/2022 Dr. [name] -right 1st stage BVT 05/24/2022 Dr. [name]  Pt states he does *** have pain/numbness in the *** hand.    The pt *** on dialysis *** at *** location.   Allergies  Allergen Reactions   Lisinopril Swelling    Angioedema to lisinopril documented in chart. Time of reaction unknown.    Lisinopril Swelling    Lip swelling   Other Other (See Comments)    Seasonal allergies    Current Outpatient Medications  Medication Sig Dispense Refill   acetaminophen (TYLENOL) 325 MG tablet Take 2 tablets (650 mg total) by mouth 3 (three) times daily. (Patient not taking: Reported on 02/19/2022) 200 tablet 0   amiodarone (PACERONE) 200 MG tablet NEW PRESCRIPTION REQUEST: AMIODARONE 200MG - TAKE ONE TABLET BY MOUTH TWICE DAILY (Patient not taking: Reported on 05/19/2022) 180 tablet 3   amiodarone (PACERONE) 200 MG tablet Take 1 tablet by mouth twice a day as directed 60 tablet 6   amLODipine (NORVASC) 2.5 MG tablet Take 1 tablet (2.5 mg total) by mouth Nightly. (Patient not taking: Reported on 05/19/2022) 90 tablet 3   apixaban (ELIQUIS) 5 MG TABS tablet NEW PRESCRIPTION REQUEST: ELIQUIS 5MG - TAKE ONE TABLET BY MOUTH TWICE DAILY 180 tablet 3   Ascorbic Acid (VITAMIN C PO) Take 1 tablet by mouth daily.     b complex vitamins tablet Take 1 tablet by mouth daily.     Biotin 1000 MCG tablet Take 1,000 mcg by mouth daily.     blood glucose meter kit and supplies KIT Dispense based on patient and insurance preference. Use to check blood sugars at least twice a day --before meals and/or at bedtime. 1 each 0   carvedilol (COREG) 25 MG tablet NEW PRESCRIPTION REQUEST: CARVEDILOL 25MG - TAKE ONE TABLET BY MOUTH TWICE DAILY 180  tablet 3   Cholecalciferol (VITAMIN D3) 50 MCG (2000 UT) capsule Take 2,000 Units by mouth daily.     Continuous Blood Gluc Sensor (FREESTYLE LIBRE 14 DAY SENSOR) MISC 1 application. by Does not apply route every 14 (fourteen) days. 2 each 3   food thickener (SIMPLYTHICK, NECTAR/LEVEL 2/MILDLY THICK,) GEL Take 10 packets by mouth as needed. (Patient not taking: Reported on 05/12/2022) [id number] packet 0   Garlic [id number] MG CAPS Take 1,000 mg by mouth daily.     guaiFENesin (MUCINEX) 600 MG 12 hr tablet Take 1 tablet (600 mg total) by mouth 2 (two) times daily. (Patient not taking: Reported on 05/12/2022) 60 tablet 0   Insulin Pen Needle 32G X 4 MM MISC Use with insulin (Patient not taking: Reported on 05/12/2022) 100 each 0   LANTUS 100 UNIT/ML injection NEW PRESCRIPTION REQUEST: INJECT 10 UNITS UNDER THE SKIN (SUBCUTANEOUSLY) AT BEDTIME (Patient not taking: Reported on 05/12/2022) 30 mL 3   lidocaine (LIDODERM) 5 % NEW PRESCRIPTION REQUEST: LIDIOCAINE FIVE% PATCH - PLACE ONE PATCH TOPICALLY TO SKIN FOR 12 HOURS ON, 12 HOURS OFF (Patient not taking: Reported on 05/12/2022) 90 patch 3   Menthol-Methyl Salicylate (MUSCLE RUB) 10-15 % CREA Apply 1 application. topically as needed for muscle pain. (Patient taking differently: Apply 1 application  topically daily as  needed for muscle pain (Chest pain).) 85 g 0   Multiple Vitamins-Minerals (MULTIVITAMIN WITH MINERALS) tablet Take 1 tablet by mouth daily.     multivitamin (RENA-VIT) TABS tablet TAKE 1 TABLET BY MOUTH AT BEDTIME (Patient not taking: Reported on 05/19/2022) 30 tablet 0   OVER THE COUNTER MEDICATION Take 800 mg by mouth daily. Kale     oxyCODONE-acetaminophen (PERCOCET) 5-325 MG tablet Take 1 tablet by mouth every 6 (six) hours as needed for severe pain. 15 tablet 0   Prasterone, DHEA, (DHEA PO) Take 1 capsule by mouth daily.     saw palmetto 160 MG capsule Take 160 mg by mouth daily.     sevelamer carbonate (RENVELA) 800 MG tablet Take 1 tablet by mouth  three times a day with meals 90 tablet 11   tamsulosin (FLOMAX) 0.4 MG CAPS capsule Take 1 capsule by mouth every night (Patient not taking: Reported on 05/12/2022) 30 capsule 11   terbinafine (LAMISIL) 250 MG tablet NEW PRESCRIPTION REQUEST: TERBINAFINE 250MG - TAKE ONE TABLET BY MOUTH DAILY (Patient not taking: Reported on 05/12/2022) 90 tablet 3   torsemide (DEMADEX) 20 MG tablet TAKE 2 TABLETS BY MOUTH IN THE MORNING. TAKE 1 TABLET IN THE EVENING. DO NOT TAKE ON DIALYSIS DAYS (TUES, THURS, SAT). (Patient not taking: Reported on 05/19/2022) 270 tablet 3   traZODone (DESYREL) 50 MG tablet NEW PRESCRIPTION REQUEST: TRAZODONE 50MG - TAKE ONE TABLET BY MOUTH DAILY AS NEEDED (Patient not taking: Reported on 05/12/2022) 90 tablet 3   Current Facility-Administered Medications  Medication Dose Route Frequency Provider Last Rate Last Admin   0.9 %  sodium chloride infusion  250 mL Intravenous PRN Angelia Mould, MD       sodium chloride flush (NS) 0.9 % injection 3 mL  3 mL Intravenous PRN Angelia Mould, MD         ROS:  See HPI  Physical Exam:  ***  Incision:  *** Extremities:   There *** a palpable *** pulse.   Motor and sensory *** in tact.   There *** a thrill/bruit present.  Access is *** easily palpable   Dialysis Duplex on 07/08/2022: ***   Assessment/Plan:  This is a 72 y.o. male who is s/p: right 1st stage BVT on 05/24/2022 by Dr. [name].   -the pt does *** have evidence of steal. -pt's access can be used ***. -if pt has tunneled catheter, this can be removed at the discretion of the dialysis center once the pt's access has been successfully cannulated to their satisfaction.  -discussed with pt that access does not last forever and [place] need intervention or even new access at some point.  -the pt Tahjir follow up ***   Leontine Locket, [facility] MD:  [name]

## 2022-07-09 ENCOUNTER — Ambulatory Visit (HOSPITAL_COMMUNITY): Payer: Medicare (Managed Care) | Attending: Vascular Surgery

## 2022-07-13 ENCOUNTER — Other Ambulatory Visit: Payer: Self-pay

## 2022-07-13 ENCOUNTER — Other Ambulatory Visit (HOSPITAL_COMMUNITY): Payer: Self-pay

## 2022-07-13 ENCOUNTER — Telehealth: Payer: Self-pay | Admitting: Family Medicine

## 2022-07-13 DIAGNOSIS — I4891 Unspecified atrial fibrillation: Secondary | ICD-10-CM

## 2022-07-13 MED ORDER — CARVEDILOL 25 MG PO TABS
ORAL_TABLET | ORAL | 3 refills | Status: DC
  Filled 2022-07-13: qty 180, 90d supply, fill #0

## 2022-07-13 NOTE — Telephone Encounter (Signed)
Called patient to schedule Medicare Annual Wellness Visit (AWV). No voicemail available to leave a message.  Last date of AWV: AWVI eligible as of 08/16/2010  Please schedule an appointment at any time with [facility].  If any questions, please contact me at [phone].   Thank you,  [place] Direct dial  [phone]

## 2022-07-14 ENCOUNTER — Encounter (HOSPITAL_COMMUNITY): Payer: Self-pay

## 2022-07-14 ENCOUNTER — Ambulatory Visit (HOSPITAL_COMMUNITY)
Admission: RE | Admit: 2022-07-14 | Discharge: 2022-07-14 | Disposition: A | Discharge status: Home / Self Care | Payer: Medicare (Managed Care) | Source: Ambulatory Visit | Attending: Family Medicine | Admitting: Family Medicine

## 2022-07-14 VITALS — BP 120/74 | HR 74 | Wt 180.2 lb

## 2022-07-14 DIAGNOSIS — Z7901 Long term (current) use of anticoagulants: Secondary | ICD-10-CM | POA: Diagnosis not present

## 2022-07-14 DIAGNOSIS — Z992 Dependence on renal dialysis: Secondary | ICD-10-CM | POA: Diagnosis not present

## 2022-07-14 DIAGNOSIS — Z79899 Other long term (current) drug therapy: Secondary | ICD-10-CM | POA: Diagnosis not present

## 2022-07-14 DIAGNOSIS — I480 Paroxysmal atrial fibrillation: Secondary | ICD-10-CM | POA: Diagnosis not present

## 2022-07-14 DIAGNOSIS — I132 Hypertensive heart and chronic kidney disease with heart failure and with stage 5 chronic kidney disease, or end stage renal disease: Secondary | ICD-10-CM | POA: Diagnosis not present

## 2022-07-14 DIAGNOSIS — G931 Anoxic brain damage, not elsewhere classified: Secondary | ICD-10-CM | POA: Diagnosis not present

## 2022-07-14 DIAGNOSIS — I4891 Unspecified atrial fibrillation: Secondary | ICD-10-CM

## 2022-07-14 DIAGNOSIS — Z8674 Personal history of sudden cardiac arrest: Secondary | ICD-10-CM

## 2022-07-14 DIAGNOSIS — I428 Other cardiomyopathies: Secondary | ICD-10-CM | POA: Diagnosis not present

## 2022-07-14 DIAGNOSIS — I472 Ventricular tachycardia, unspecified: Secondary | ICD-10-CM | POA: Diagnosis not present

## 2022-07-14 DIAGNOSIS — E1122 Type 2 diabetes mellitus with diabetic chronic kidney disease: Secondary | ICD-10-CM | POA: Diagnosis not present

## 2022-07-14 DIAGNOSIS — I493 Ventricular premature depolarization: Secondary | ICD-10-CM | POA: Insufficient documentation

## 2022-07-14 DIAGNOSIS — Z8249 Family history of ischemic heart disease and other diseases of the circulatory system: Secondary | ICD-10-CM | POA: Diagnosis not present

## 2022-07-14 DIAGNOSIS — I5022 Chronic systolic (congestive) heart failure: Secondary | ICD-10-CM | POA: Insufficient documentation

## 2022-07-14 DIAGNOSIS — N186 End stage renal disease: Secondary | ICD-10-CM | POA: Diagnosis not present

## 2022-07-14 DIAGNOSIS — I5020 Unspecified systolic (congestive) heart failure: Secondary | ICD-10-CM

## 2022-07-14 LAB — COMPREHENSIVE METABOLIC PANEL
ALT: 16 U/L (ref 0–44)
AST: 21 U/L (ref 15–41)
Albumin: 3.3 g/dL — ABNORMAL LOW (ref 3.5–5.0)
Alkaline Phosphatase: 79 U/L (ref 38–126)
Anion gap: 11 (ref 5–15)
BUN: 28 mg/dL — ABNORMAL HIGH (ref 8–23)
CO2: 31 mmol/L (ref 22–32)
Calcium: 9 mg/dL (ref 8.9–10.3)
Chloride: 96 mmol/L — ABNORMAL LOW (ref 98–111)
Creatinine, Ser: 5.57 mg/dL — ABNORMAL HIGH (ref 0.61–1.24)
GFR, Estimated: 10 mL/min — ABNORMAL LOW (ref >60)
Glucose, Bld: 137 mg/dL — ABNORMAL HIGH (ref 70–99)
Potassium: 4 mmol/L (ref 3.5–5.1)
Sodium: 138 mmol/L (ref 135–145)
Total Bilirubin: 0.8 mg/dL (ref 0.3–1.2)
Total Protein: 7.9 g/dL (ref 6.5–8.1)

## 2022-07-14 LAB — TSH: TSH: 2.354 u[IU]/mL (ref 0.350–4.500)

## 2022-07-14 MED ORDER — AMIODARONE HCL 200 MG PO TABS: 200.0000 mg | ORAL_TABLET | Freq: Every day | ORAL | 3 refills | Status: DC
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
## 2022-07-14 NOTE — Patient Instructions (Addendum)
DECREASE Amiodarone to 200 mg one tab daily  Labs today We [place] only contact you if something comes back abnormal or we need to make some changes. Otherwise no news is good news!  Your physician recommends that you schedule a follow-up appointment in: 3 months with Dr Haroldine Laws and echo  Your physician has requested that you have an echocardiogram. Echocardiography is a painless test that uses sound waves to create images of your heart. It provides your doctor with information about the size and shape of your heart and how well your heart's chambers and valves are working. This procedure takes approximately one hour. There are no restrictions for this procedure. Please do NOT wear cologne, perfume, aftershave, or lotions (deodorant is allowed). Please arrive 15 minutes prior to your appointment time.  Do the following things EVERYDAY: Weigh yourself in the morning before breakfast. Write it down and keep it in a log. Take your medicines as prescribed Eat low salt foods--Limit salt (sodium) to 2000 mg per day.  Stay as active as you can everyday Limit all fluids for the day to less than 2 liters  At the [facility], you and your health needs are our priority. As part of our continuing mission to provide you with exceptional heart care, we have created designated Provider Care Teams. These Care Teams include your primary Cardiologist (physician) and Advanced Practice Providers (APPs- Physician Assistants and Nurse Practitioners) who all work together to provide you with the care you need, when you need it.   You may see any of the following providers on your designated Care Team at your next follow up: Dr Glori Bickers Dr Loralie Champagne Dr. Roxana Hires, NP Lyda Jester, [place] [facility] [place], [place] Forestine Na, NP Audry Riles, PharmD   Please be sure to bring in all your medications bottles to every appointment.    Thank you for  choosing [facility]   If you have any questions or concerns before your next appointment please send [place] a message through [place] or call our office at [phone].    TO LEAVE A MESSAGE FOR THE NURSE SELECT OPTION 2, PLEASE LEAVE A MESSAGE INCLUDING: YOUR NAME DATE OF BIRTH CALL BACK NUMBER REASON FOR CALL**this is important as we prioritize the call backs  YOU [place] RECEIVE A CALL BACK THE SAME DAY AS LONG AS YOU CALL BEFORE 4:00 PM

## 2022-07-15 MED ORDER — CARVEDILOL 25 MG PO TABS
25.0000 mg | ORAL_TABLET | Freq: Two times a day (BID) | ORAL | 3 refills | Status: DC
  Filled 2022-07-23: qty 180, 90d supply, fill #0

## 2022-07-15 MED ORDER — AMIODARONE HCL 200 MG PO TABS
200.0000 mg | ORAL_TABLET | Freq: Two times a day (BID) | ORAL | 6 refills | Status: DC
  Filled 2022-07-23 – 2023-01-28 (×2): qty 60, 30d supply, fill #0
  Filled 2023-06-28: qty 60, 30d supply, fill #1

## 2022-07-15 MED ORDER — APIXABAN 5 MG PO TABS: ORAL_TABLET | ORAL | 3 refills | Status: DC

## 2022-07-15 NOTE — Telephone Encounter (Signed)
Pts wife is calling to check the status. Christen Bame, CMA

## 2022-07-16 ENCOUNTER — Encounter (HOSPITAL_COMMUNITY): Payer: Self-pay

## 2022-07-17 IMAGING — RF DG SWALLOWING FUNCTION
12 of 22 series · 12 of 24 positions shown · non-contrast
Comparison: Modified barium swallow 08/20/2021.

CLINICAL DATA: Dysphagia, unspecified type.

EXAM:
MODIFIED BARIUM SWALLOW
TECHNIQUE: Different consistencies of barium were administered orally to the
patient by the Speech Pathologist. Imaging of the pharynx was
performed in the lateral projection. Pahola Zeleke, NP was present in
the fluoroscopy suite and operated the fluoroscopy equipment.
FLUOROSCOPY:
Fluoroscopy time: 2 minutes, 40 seconds.
Radiation Exposure Index (as provided by the fluoroscopic device):
11.74 mGy Kerma

[Series 2: run · 1 of 101 frames shown (1 of 12)]
[frame 16/101]
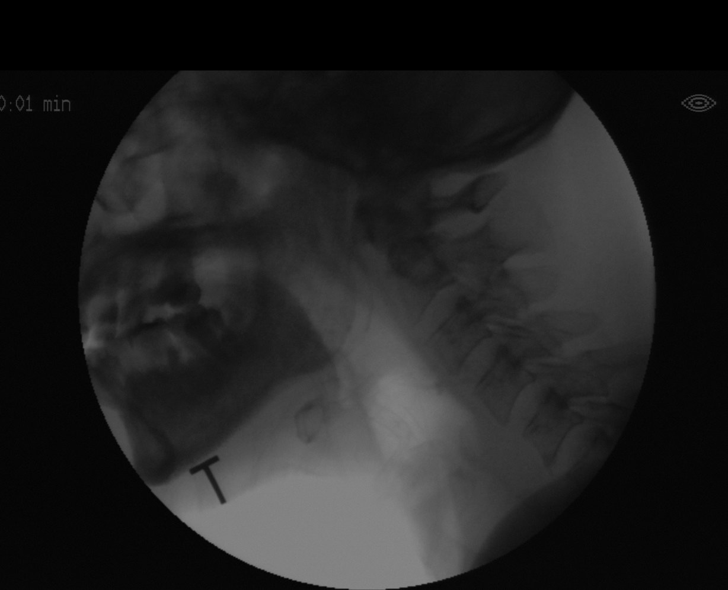

[Series 3: run · 1 of 61 frames shown (2 of 12)]
[frame 61/61]
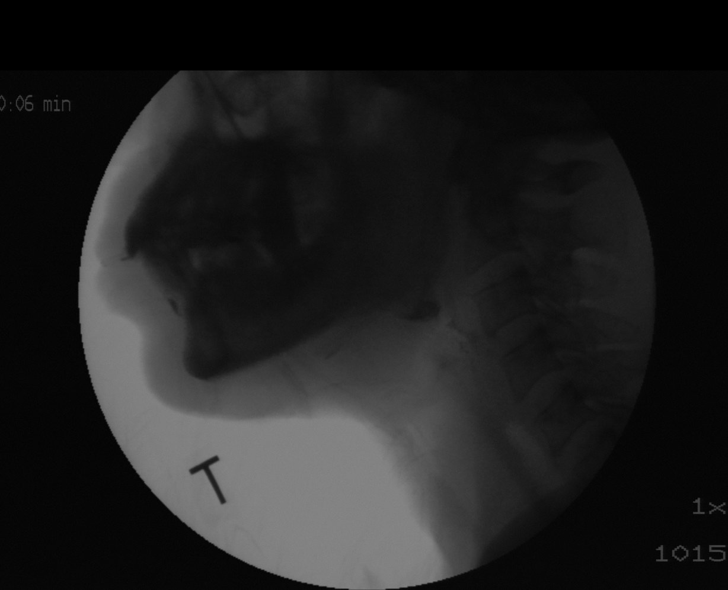

[Series 5: run · 1 of 452 frames shown (3 of 12)]
[frame 447/452]
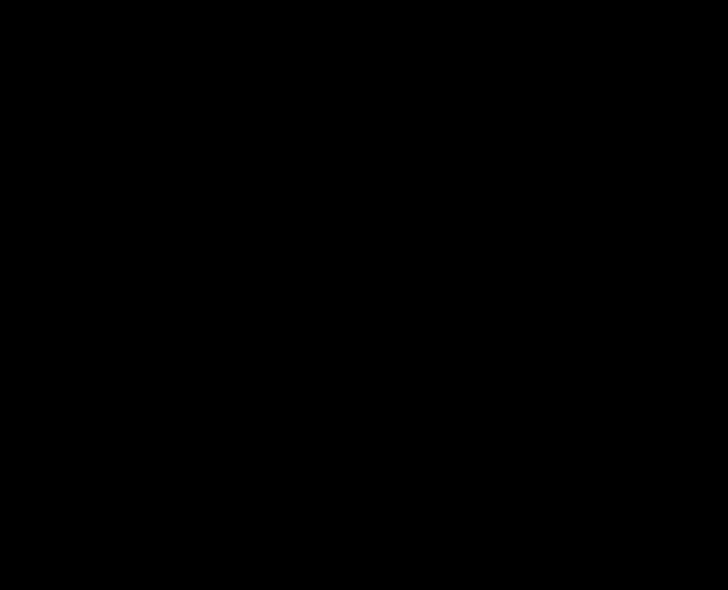

[Series 7: run · 1 of 160 frames shown (4 of 12)]
[frame 115/160]
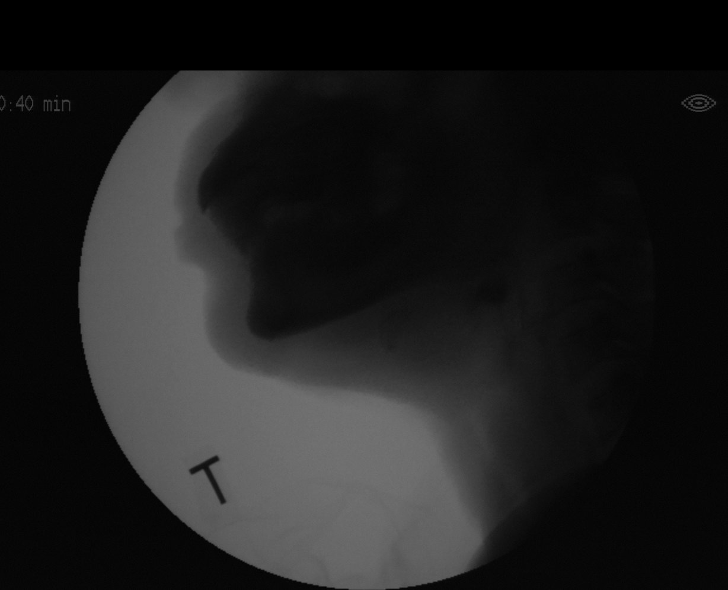

[Series 9: run · 1 of 114 frames shown (5 of 12)]
[frame 58/114]
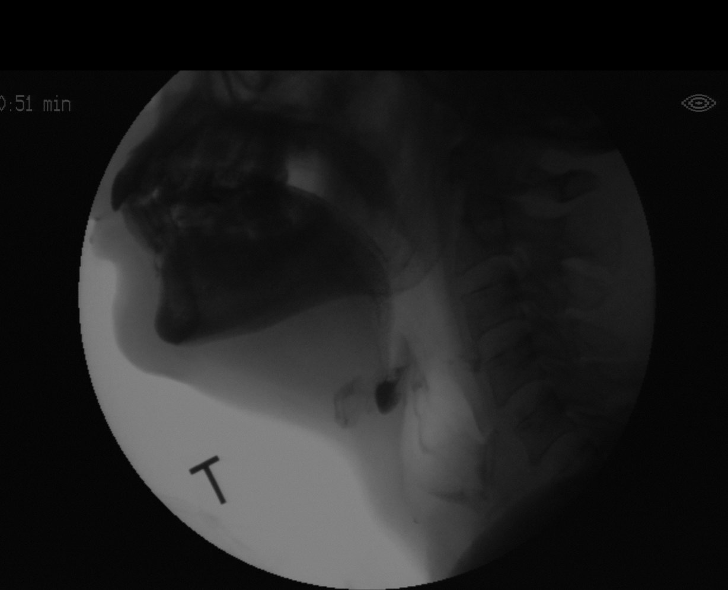

[Series 11: run · 1 of 73 frames shown (6 of 12)]
[frame 37/73]
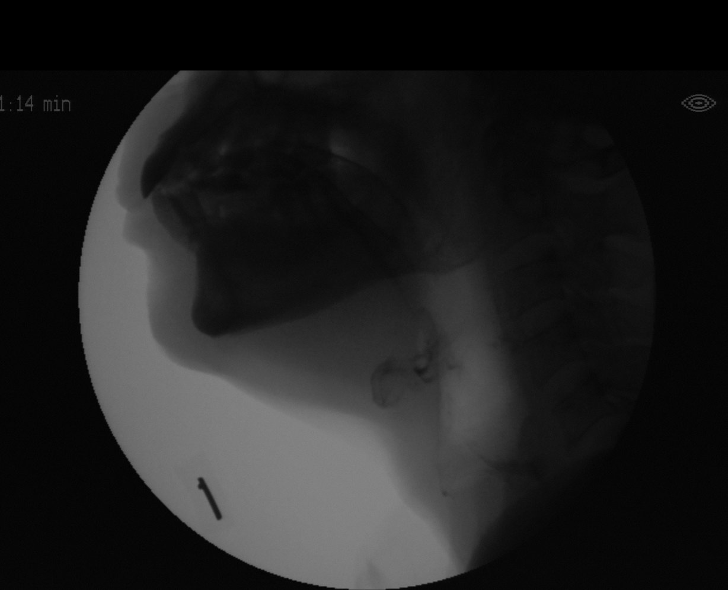

[Series 13: run · 1 of 226 frames shown (7 of 12)]
[frame 46/226]
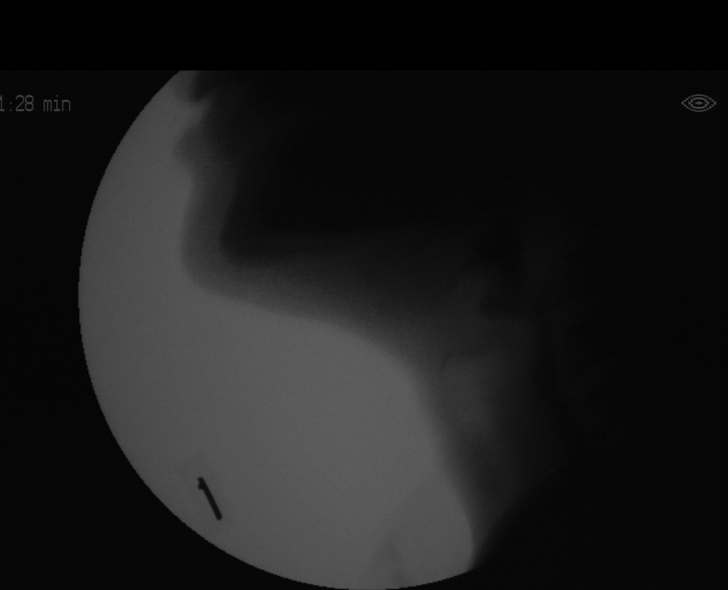

[Series 15: run · 1 of 354 frames shown (8 of 12)]
[frame 178/354]
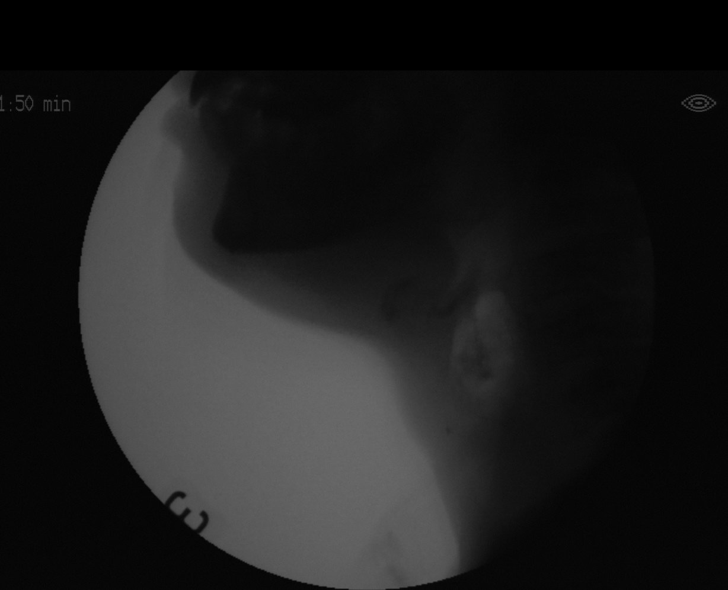

[Series 17: run · 1 of 215 frames shown (9 of 12)]
[frame 33/215]
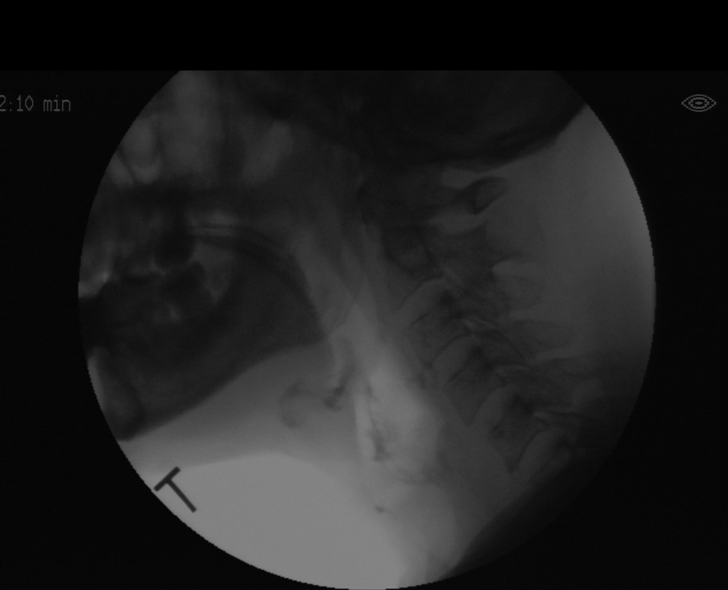

[Series 19: run · 1 of 43 frames shown (10 of 12)]
[frame 1/43]
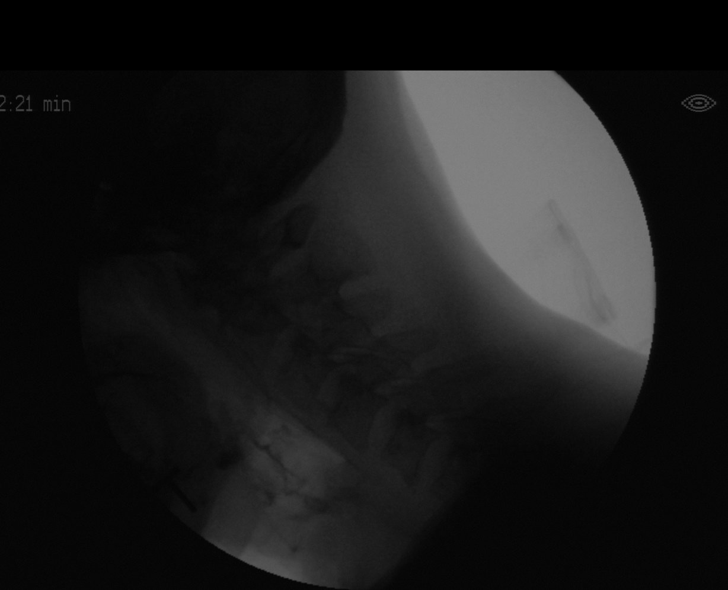

[Series 20: run · 1 of 63 frames shown (11 of 12)]
[frame 60/63]
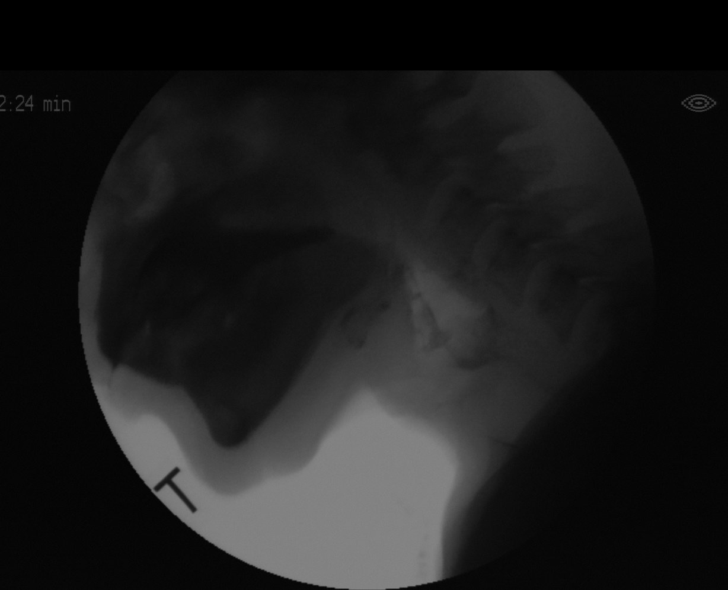

[Series 22: run · 1 of 169 frames shown (12 of 12)]
[frame 144/169]
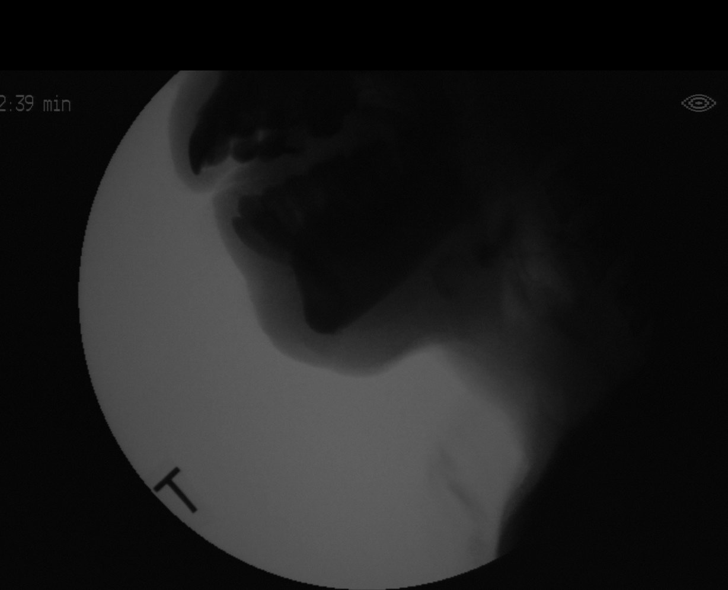

[12 of 24 positions shown; findings below may reference images not displayed]

FINDINGS: Different consistencies of barium were administered orally to the
patient by the Speech Pathologist with fluoroscopic imaging of the
pharynx, as well as limited imaging of the esophagus, from a lateral
projection. Pahola Zeleke, NP was present in the fluoroscopy suite and
operated the fluoroscopy equipment. Trace silent aspiration was
observed with thin consistency. Please refer to the Speech
Pathologist's report for full details.
IMPRESSION: Modified barium swallow, as described.

Trace silent aspiration observed with thin consistency.

Please refer to the Speech Pathologist's report for complete details
and recommendations.

## 2022-07-20 ENCOUNTER — Other Ambulatory Visit: Payer: Self-pay

## 2022-07-22 ENCOUNTER — Other Ambulatory Visit: Payer: Self-pay

## 2022-07-22 ENCOUNTER — Other Ambulatory Visit (HOSPITAL_COMMUNITY): Payer: Self-pay

## 2022-07-23 ENCOUNTER — Other Ambulatory Visit: Payer: Self-pay

## 2022-07-23 ENCOUNTER — Ambulatory Visit (INDEPENDENT_AMBULATORY_CARE_PROVIDER_SITE_OTHER): Payer: Medicare (Managed Care) | Admitting: Physician Assistant

## 2022-07-23 ENCOUNTER — Telehealth: Payer: Self-pay

## 2022-07-23 ENCOUNTER — Ambulatory Visit (HOSPITAL_COMMUNITY)
Admission: RE | Admit: 2022-07-23 | Discharge: 2022-07-23 | Disposition: A | Discharge status: Home / Self Care | Payer: Medicare (Managed Care) | Source: Ambulatory Visit | Attending: Vascular Surgery | Admitting: Vascular Surgery

## 2022-07-23 VITALS — BP 140/82 | HR 70 | Temp 97.8°F | Resp 16 | Ht 69.0 in | Wt 181.0 lb

## 2022-07-23 DIAGNOSIS — Z992 Dependence on renal dialysis: Secondary | ICD-10-CM | POA: Diagnosis not present

## 2022-07-23 DIAGNOSIS — N186 End stage renal disease: Secondary | ICD-10-CM | POA: Insufficient documentation

## 2022-07-23 NOTE — Progress Notes (Signed)
POST OPERATIVE OFFICE NOTE    CC:  F/u for surgery  HPI:  This is a 72 y.o. male who is ESRD s/p right BB AV fistula on 05/24/22 by Dr. [name].  He had a previous  left radiocephaic arteriovenous fistula placement that failed to mature. He has a working right IJ TDC.    Pt returns today for follow up.  Pt states he has no loss of motor, sensation or pain out of the ordinary.     Allergies  Allergen Reactions   Lisinopril Swelling    Angioedema to lisinopril documented in chart. Time of reaction unknown.    Lisinopril Swelling    Lip swelling   Other Other (See Comments)    Seasonal allergies    Current Outpatient Medications  Medication Sig Dispense Refill   amiodarone (PACERONE) 200 MG tablet Take 1 tablet by mouth twice a day as directed 60 tablet 6   amiodarone (PACERONE) 200 MG tablet Take 1 tablet (200 mg total) by mouth daily. 90 tablet 3   amLODipine (NORVASC) 2.5 MG tablet Take 1 tablet (2.5 mg total) by mouth Nightly. (Patient not taking: Reported on 07/14/2022) 90 tablet 3   apixaban (ELIQUIS) 5 MG TABS tablet NEW PRESCRIPTION REQUEST: ELIQUIS '5MG'$  - TAKE ONE TABLET BY MOUTH TWICE DAILY 180 tablet 3   Ascorbic Acid (VITAMIN C PO) Take 1 tablet by mouth daily.     b complex vitamins tablet Take 1 tablet by mouth daily.     Biotin 1000 MCG tablet Take 1,000 mcg by mouth daily.     blood glucose meter kit and supplies KIT Dispense based on patient and insurance preference. Use to check blood sugars at least twice a day --before meals and/or at bedtime. 1 each 0   carvedilol (COREG) 25 MG tablet NEW PRESCRIPTION REQUEST: CARVEDILOL '25MG'$  - TAKE ONE TABLET BY MOUTH TWICE DAILY 180 tablet 3   carvedilol (COREG) 25 MG tablet Take 1 tablet by mouth twice a day as directed 180 tablet 3   Cholecalciferol (VITAMIN D3) 50 MCG (2000 UT) capsule Take 2,000 Units by mouth daily.     Continuous Blood Gluc Sensor (FREESTYLE LIBRE 14 DAY SENSOR) MISC 1 application. by Does not apply route  every 14 (fourteen) days. 2 each 3   Garlic 123XX123 MG CAPS Take 1,000 mg by mouth daily.     Menthol-Methyl Salicylate (MUSCLE RUB) 10-15 % CREA Apply 1 application. topically as needed for muscle pain. 85 g 0   multivitamin (RENA-VIT) TABS tablet TAKE 1 TABLET BY MOUTH AT BEDTIME 30 tablet 0   OVER THE COUNTER MEDICATION Take 800 mg by mouth daily. Kale     Prasterone, DHEA, (DHEA PO) Take 1 capsule by mouth daily.     saw palmetto 160 MG capsule Take 160 mg by mouth daily.     sevelamer carbonate (RENVELA) 800 MG tablet Take 800 mg by mouth 3 (three) times daily with meals.     Current Facility-Administered Medications  Medication Dose Route Frequency Provider Last Rate Last Admin   0.9 %  sodium chloride infusion  250 mL Intravenous PRN Angelia Mould, MD       sodium chloride flush (NS) 0.9 % injection 3 mL  3 mL Intravenous PRN Angelia Mould, MD         ROS:  See HPI  Physical Exam:   Findings:  +--------------------+----------+-----------------+--------+  AVF  PSV (cm/s)Flow Vol (mL/min)Comments  +--------------------+----------+-----------------+--------+  Native artery inflow   146          1029                 +--------------------+----------+-----------------+--------+  AVF Anastomosis        247                               +--------------------+----------+-----------------+--------+     +------------+---------+------------+----------+---------------------------  ----+  OUTFLOW VEIN   PSV     Diameter  Depth (cm)           Describe                            (cm/s)      (cm)                                                +------------+---------+------------+----------+---------------------------  ----+  Prox UA        127       0.64       1.01                                     +------------+---------+------------+----------+---------------------------  ----+  Mid UA         162       0.64        1.01                                     +------------+---------+------------+----------+---------------------------  ----+  Dist UA        241       0.69       0.84   competing branch measuring  0.17  +------------+---------+------------+----------+---------------------------  ----+  AC Fossa       371       0.51       0.35     competing branch  measuring                                                            0.27cm                +------------+---------+------------+----------+---------------------------  ----+        Summary:  Patent arteriovenous fistula. Competing branches as mentioned above,  largest measuring 0.27cm.     Incision:  well healed Extremities:  n/m/v intact Neuro: sensation intact Right TDC in place    Assessment/Plan:  This is a 72 y.o. male who is s/p:right AV fistula creation BC by Dr. [name] on 05/24/22.  He has no symptoms of steal and is on HD TTS via right IJ TDC placed by  IR.     The fistula is deeper than 0.6 cm, the diameter is > 0.6 above the anastomosis.  The fistula is maturing well, however it is too deep to access. I Drayke schedule him for second stage basilic transposition.  He  is on Eliquis which Avrohom be held.  Plan for Dr. [name] to perform surgery in the next 1-2 weeks.     Roxy Horseman PA-C [facility] [phone]   Clinic MD:  [name]

## 2022-07-23 NOTE — Telephone Encounter (Signed)
Spoke with [facility]) who I also gave pt's pre op instructions to. She Saba reiterate all instructions given.

## 2022-07-23 NOTE — Telephone Encounter (Signed)
Called pt to schedule his surgery. He has chosen his surgery date but requested we call Kenston Klotz to give her the pre op instructions. I have left her a VM asking for her to call us back.

## 2022-07-26 ENCOUNTER — Other Ambulatory Visit (HOSPITAL_COMMUNITY): Payer: Self-pay

## 2022-07-26 MED ORDER — APIXABAN 5 MG PO TABS: 5.0000 mg | ORAL_TABLET | Freq: Two times a day (BID) | ORAL | 1 refills | Status: DC

## 2022-07-29 NOTE — Progress Notes (Addendum)
PCP - Dr. Arlyce Dice Primary Cardiologist - Dr. Larae Grooms HF Cardiologist: Dr. Haroldine Laws  PPM/ICD - Denies  Chest x-ray - 07/31/2021 EKG - 07/14/2022 Stress Test - denies ECHO - 07/14/2022 Cardiac Cath -04/16/2019  CPAP - Denies  Type II Diabetic. Freestyle Libre continuous monitor. Med rec is complete, pt is not on any medications for diabetes, states diet controlled Fasting Blood Sugar - 110-120 Checks Blood Sugar  continuous with Freestyle Libre   Blood Thinner Instructions: Hold Eliquis three days prior to surgery.  Last dose 07/29/2022 Aspirin Instructions: Denies  ERAS Protcol - Yes, clear liquids until 3 hours prior to surgery  COVID TEST- Denies  Anesthesia review: YES. Hx Cardiac Arrest, HF, Non-sustained Vtach, HTN, DM, A-Fib, ESRD  Patient verbally denies any shortness of breath, fever, cough and chest pain during phone call   -------------  SDW INSTRUCTIONS given:  Your procedure is scheduled on Monday, August 02, 2022.  Report to [facility] Main Entrance "A" at 0530 A.M., and check in at the Admitting office.  Call this number if you have problems the morning of surgery:  [phone]   Remember:  Do not eat after midnight the night before your surgery  You may drink clear liquids until 0430 the morning of your surgery.   Clear liquids allowed are: Water, Non-Citrus Juices (without pulp), Carbonated Beverages, Clear Tea, Black Coffee Only, and Gatorade   Take these medicines the morning of surgery with A SIP OF WATER  Amiodarone, Coreg  Follow your surgeon's instructions to stop Eliquis three days prior to surgery.  Last dose 07/29/2022 .      As of today, STOP taking any Aspirin (unless otherwise instructed by your surgeon) Aleve, Naproxen, Ibuprofen, Motrin, Advil, Goody's, BC's, all herbal medications, fish oil, and all vitamins.                      Do not wear lotions, powders, colognes, or deodorant.            Do not shave 48 hours prior to  surgery.  Men may shave face and neck.            Do not bring valuables to the hospital.             [facility] is not responsible for any belongings or valuables.  Do NOT Smoke (Tobacco/Vaping) 24 hours prior to your procedure  If you use a CPAP at night, you may bring all equipment for your overnight stay.   Contacts, glasses, dentures or bridgework may not be worn into surgery.      For patients admitted to the hospital, discharge time [place] be determined by your treatment team.   Patients discharged the day of surgery [place] not be allowed to drive home, and someone needs to stay with them for 24 hours.    Special instructions:   [place]- Preparing For Surgery  Before surgery, you can play an important role. Because skin is not sterile, your skin needs to be as free of germs as possible. You can reduce the number of germs on your skin by washing with Dial Soap before surgery.   Oral Hygiene is also important to reduce your risk of infection.  Remember - BRUSH YOUR TEETH THE MORNING OF SURGERY WITH YOUR REGULAR TOOTHPASTE   Please follow these instructions carefully.   Shower the NIGHT BEFORE SURGERY and the MORNING OF SURGERY with DIAL Soap.   Pat yourself dry with a CLEAN  TOWEL.  Wear CLEAN PAJAMAS to bed the night before surgery  Place CLEAN SHEETS on your bed the night of your first shower and DO NOT SLEEP WITH PETS.   Day of Surgery: Please shower morning of surgery  Wear Clean/Comfortable clothing the morning of surgery Do not apply any deodorants/lotions.   Remember to brush your teeth WITH YOUR REGULAR TOOTHPASTE.   Questions were answered. Patient verbalized understanding of instructions.

## 2022-07-30 ENCOUNTER — Encounter (HOSPITAL_COMMUNITY): Payer: Self-pay | Admitting: Vascular Surgery

## 2022-07-30 ENCOUNTER — Other Ambulatory Visit: Payer: Self-pay

## 2022-07-30 NOTE — Progress Notes (Signed)
During SDW call for surgery 08/02/2022, patient has intermittent outbursts of yelling at this nurse. States he is NOT going to stop taking his multivitamins or OTC medications.  States he Joh NOT take a shower with antibacterial soap and we need to quit telling him how to shower or bathe every time he has a surgery.  Very difficult to obtain information to complete SDW, but was able to calm patient down.  [name] arrive by transit bus and his daughter Justyce pick him up from surgery.

## 2022-08-01 NOTE — Anesthesia Preprocedure Evaluation (Signed)
Anesthesia Evaluation  Patient identified by MRN, date of birth, ID band Patient awake    Reviewed: Allergy & Precautions, H&P , NPO status , Patient's Chart, lab work & pertinent test results  Airway Mallampati: II  TM Distance: >3 FB Neck ROM: Full    Dental no notable dental hx. (+) Poor Dentition, Dental Advisory Given   Pulmonary neg pulmonary ROS   Pulmonary exam normal breath sounds clear to auscultation       Cardiovascular Exercise Tolerance: Good hypertension, Pt. on medications and Pt. on home beta blockers +CHF  + dysrhythmias  Rhythm:Regular Rate:Normal     Neuro/Psych negative neurological ROS  negative psych ROS   GI/Hepatic negative GI ROS, Neg liver ROS,,,  Endo/Other  diabetes    Renal/GU ESRF and DialysisRenal disease  negative genitourinary   Musculoskeletal   Abdominal   Peds  Hematology  (+) Blood dyscrasia, anemia   Anesthesia Other Findings   Reproductive/Obstetrics negative OB ROS                             Anesthesia Physical Anesthesia Plan  ASA: 3  Anesthesia Plan: MAC and Regional   Post-op Pain Management: Tylenol PO (pre-op)*   Induction: Intravenous  PONV Risk Score and Plan: 1 and Ondansetron and Propofol infusion  Airway Management Planned: Natural Airway and Simple Face Mask  Additional Equipment:   Intra-op Plan:   Post-operative Plan:   Informed Consent: I have reviewed the patients History and Physical, chart, labs and discussed the procedure including the risks, benefits and alternatives for the proposed anesthesia with the patient or authorized representative who has indicated his/her understanding and acceptance.     Dental advisory given  Plan Discussed with: CRNA  Anesthesia Plan Comments:        Anesthesia Quick Evaluation

## 2022-08-02 ENCOUNTER — Ambulatory Visit (HOSPITAL_COMMUNITY)
Admission: RE | Admit: 2022-08-02 | Discharge: 2022-08-02 | Disposition: A | Discharge status: Home / Self Care | Payer: Medicare (Managed Care) | Attending: Vascular Surgery | Admitting: Vascular Surgery

## 2022-08-02 ENCOUNTER — Ambulatory Visit (HOSPITAL_BASED_OUTPATIENT_CLINIC_OR_DEPARTMENT_OTHER): Payer: Medicare (Managed Care) | Admitting: Physician Assistant

## 2022-08-02 ENCOUNTER — Ambulatory Visit (HOSPITAL_COMMUNITY): Payer: Medicare (Managed Care) | Admitting: Physician Assistant

## 2022-08-02 ENCOUNTER — Other Ambulatory Visit: Payer: Self-pay

## 2022-08-02 ENCOUNTER — Telehealth: Payer: Self-pay

## 2022-08-02 ENCOUNTER — Encounter (HOSPITAL_COMMUNITY)
Admission: RE | Disposition: A | Discharge status: Home / Self Care | Payer: Self-pay | Source: Home / Self Care | Attending: Vascular Surgery

## 2022-08-02 ENCOUNTER — Encounter (HOSPITAL_COMMUNITY): Payer: Self-pay | Admitting: Vascular Surgery

## 2022-08-02 DIAGNOSIS — Z7901 Long term (current) use of anticoagulants: Secondary | ICD-10-CM | POA: Insufficient documentation

## 2022-08-02 DIAGNOSIS — Z992 Dependence on renal dialysis: Secondary | ICD-10-CM

## 2022-08-02 DIAGNOSIS — D631 Anemia in chronic kidney disease: Secondary | ICD-10-CM

## 2022-08-02 DIAGNOSIS — N185 Chronic kidney disease, stage 5: Secondary | ICD-10-CM | POA: Diagnosis not present

## 2022-08-02 DIAGNOSIS — E1122 Type 2 diabetes mellitus with diabetic chronic kidney disease: Secondary | ICD-10-CM

## 2022-08-02 DIAGNOSIS — N186 End stage renal disease: Secondary | ICD-10-CM

## 2022-08-02 DIAGNOSIS — Z79899 Other long term (current) drug therapy: Secondary | ICD-10-CM | POA: Diagnosis not present

## 2022-08-02 DIAGNOSIS — I132 Hypertensive heart and chronic kidney disease with heart failure and with stage 5 chronic kidney disease, or end stage renal disease: Secondary | ICD-10-CM

## 2022-08-02 DIAGNOSIS — I4891 Unspecified atrial fibrillation: Secondary | ICD-10-CM | POA: Insufficient documentation

## 2022-08-02 DIAGNOSIS — I509 Heart failure, unspecified: Secondary | ICD-10-CM | POA: Insufficient documentation

## 2022-08-02 HISTORY — PX: BASCILIC VEIN TRANSPOSITION: SHX5742

## 2022-08-02 LAB — POCT I-STAT, CHEM 8
BUN: 39 mg/dL — ABNORMAL HIGH (ref 8–23)
Calcium, Ion: 0.99 mmol/L — ABNORMAL LOW (ref 1.15–1.40)
Chloride: 96 mmol/L — ABNORMAL LOW (ref 98–111)
Creatinine, Ser: 7 mg/dL — ABNORMAL HIGH (ref 0.61–1.24)
Glucose, Bld: 150 mg/dL — ABNORMAL HIGH (ref 70–99)
HCT: 34 % — ABNORMAL LOW (ref 39.0–52.0)
Hemoglobin: 11.6 g/dL — ABNORMAL LOW (ref 13.0–17.0)
Potassium: 3.8 mmol/L (ref 3.5–5.1)
Sodium: 139 mmol/L (ref 135–145)
TCO2: 30 mmol/L (ref 22–32)

## 2022-08-02 LAB — GLUCOSE, CAPILLARY
Glucose-Capillary: 148 mg/dL — ABNORMAL HIGH (ref 70–99)
Glucose-Capillary: 120 mg/dL — ABNORMAL HIGH (ref 70–99)

## 2022-08-02 SURGERY — TRANSPOSITION, VEIN, BASILIC
Anesthesia: Monitor Anesthesia Care | Site: Arm Upper | Laterality: Right

## 2022-08-02 MED ORDER — HEPARIN SODIUM (PORCINE) 1000 UNIT/ML IJ SOLN
INTRAMUSCULAR | Status: DC | PRN
  Administered 2022-08-02: 3000 [IU] via INTRAVENOUS

## 2022-08-02 MED ORDER — FENTANYL CITRATE (PF) 100 MCG/2ML IJ SOLN
INTRAMUSCULAR | Status: AC
  Filled 2022-08-02: qty 2

## 2022-08-02 MED ORDER — CHLORHEXIDINE GLUCONATE 0.12 % MT SOLN: 15.0000 mL | Freq: Once | OROMUCOSAL | Status: AC

## 2022-08-02 MED ORDER — LIDOCAINE HCL (PF) 1 % IJ SOLN
INTRAMUSCULAR | Status: AC
  Filled 2022-08-02: qty 30

## 2022-08-02 MED ORDER — CHLORHEXIDINE GLUCONATE 4 % EX LIQD: 60.0000 mL | Freq: Once | CUTANEOUS | Status: DC

## 2022-08-02 MED ORDER — SODIUM CHLORIDE 0.9 % IV SOLN: INTRAVENOUS | Status: DC

## 2022-08-02 MED ORDER — ORAL CARE MOUTH RINSE: 15.0000 mL | Freq: Once | OROMUCOSAL | Status: AC

## 2022-08-02 MED ORDER — CHLORHEXIDINE GLUCONATE 0.12 % MT SOLN
OROMUCOSAL | Status: AC
  Administered 2022-08-02: 15 mL via OROMUCOSAL
  Filled 2022-08-02: qty 15

## 2022-08-02 MED ORDER — MIDAZOLAM HCL 2 MG/2ML IJ SOLN
INTRAMUSCULAR | Status: DC | PRN
  Administered 2022-08-02: 1 mg via INTRAVENOUS

## 2022-08-02 MED ORDER — FENTANYL CITRATE (PF) 100 MCG/2ML IJ SOLN
INTRAMUSCULAR | Status: DC | PRN
  Administered 2022-08-02: 50 ug via INTRAVENOUS

## 2022-08-02 MED ORDER — APIXABAN 5 MG PO TABS: 5.0000 mg | ORAL_TABLET | Freq: Two times a day (BID) | ORAL | 1 refills | Status: DC

## 2022-08-02 MED ORDER — MIDAZOLAM HCL 2 MG/2ML IJ SOLN
INTRAMUSCULAR | Status: AC
  Filled 2022-08-02: qty 2

## 2022-08-02 MED ORDER — ACETAMINOPHEN 500 MG PO TABS
1000.0000 mg | ORAL_TABLET | Freq: Once | ORAL | Status: AC
  Administered 2022-08-02: 1000 mg via ORAL
  Filled 2022-08-02: qty 2

## 2022-08-02 MED ORDER — PHENYLEPHRINE 80 MCG/ML (10ML) SYRINGE FOR IV PUSH (FOR BLOOD PRESSURE SUPPORT)
PREFILLED_SYRINGE | INTRAVENOUS | Status: DC | PRN
  Administered 2022-08-02 (×2): 160 ug via INTRAVENOUS
  Administered 2022-08-02: 80 ug via INTRAVENOUS
  Administered 2022-08-02 (×5): 160 ug via INTRAVENOUS

## 2022-08-02 MED ORDER — HEPARIN 6000 UNIT IRRIGATION SOLUTION
Status: AC
  Filled 2022-08-02: qty 500

## 2022-08-02 MED ORDER — INSULIN ASPART 100 UNIT/ML IJ SOLN: 0.0000 [IU] | INTRAMUSCULAR | Status: DC | PRN

## 2022-08-02 MED ORDER — HEPARIN 6000 UNIT IRRIGATION SOLUTION
Status: DC | PRN
  Administered 2022-08-02: 1

## 2022-08-02 MED ORDER — PROPOFOL 500 MG/50ML IV EMUL
INTRAVENOUS | Status: DC | PRN
  Administered 2022-08-02: 50 ug/kg/min via INTRAVENOUS

## 2022-08-02 MED ORDER — 0.9 % SODIUM CHLORIDE (POUR BTL) OPTIME
TOPICAL | Status: DC | PRN
  Administered 2022-08-02: 1000 mL

## 2022-08-02 MED ORDER — CEFAZOLIN SODIUM-DEXTROSE 2-4 GM/100ML-% IV SOLN
2.0000 g | INTRAVENOUS | Status: AC
  Administered 2022-08-02: 2 g via INTRAVENOUS
  Filled 2022-08-02: qty 100

## 2022-08-02 MED ORDER — OXYCODONE-ACETAMINOPHEN 5-325 MG PO TABS: 1.0000 | ORAL_TABLET | ORAL | 0 refills | Status: AC | PRN

## 2022-08-02 SURGICAL SUPPLY — 53 items
ADH SKN CLS APL DERMABOND .7 (GAUZE/BANDAGES/DRESSINGS) ×3
ARMBAND PINK RESTRICT EXTREMIT (MISCELLANEOUS) ×1 IMPLANT
BAG COUNTER SPONGE SURGICOUNT (BAG) ×1 IMPLANT
BAG SPNG CNTER NS LX DISP (BAG) ×1
BNDG ELASTIC 4X5.8 VLCR STR LF (GAUZE/BANDAGES/DRESSINGS) ×1 IMPLANT
CANISTER SUCT 3000ML PPV (MISCELLANEOUS) ×1 IMPLANT
CLIP LIGATING EXTRA MED SLVR (CLIP) ×1 IMPLANT
CLIP LIGATING EXTRA SM BLUE (MISCELLANEOUS) ×1 IMPLANT
COVER PROBE W GEL 5X96 (DRAPES) ×1 IMPLANT
DERMABOND ADVANCED .7 DNX12 (GAUZE/BANDAGES/DRESSINGS) ×1 IMPLANT
ELECT REM PT RETURN 9FT ADLT (ELECTROSURGICAL) ×1 IMPLANT
ELECTRODE REM PT RTRN 9FT ADLT (ELECTROSURGICAL) ×1 IMPLANT
GLOVE BIO SURGEON STRL SZ 6.5 (GLOVE) IMPLANT
GLOVE BIOGEL PI IND STRL 7.0 (GLOVE) IMPLANT
GLOVE BIOGEL PI IND STRL 7.5 (GLOVE) IMPLANT
GLOVE BIOGEL PI IND STRL 8 (GLOVE) ×1 IMPLANT
GLOVE SURG SS PI 6.5 STRL IVOR (GLOVE) IMPLANT
GOWN STRL REUS W/ TWL LRG LVL3 (GOWN DISPOSABLE) ×2 IMPLANT
GOWN STRL REUS W/TWL 2XL LVL3 (GOWN DISPOSABLE) ×2 IMPLANT
GOWN STRL REUS W/TWL LRG LVL3 (GOWN DISPOSABLE) ×5
KIT BASIN OR (CUSTOM PROCEDURE TRAY) ×1 IMPLANT
KIT TURNOVER KIT B (KITS) ×1 IMPLANT
LOOP VASCLR MAXI BLUE 18IN ST (MISCELLANEOUS) IMPLANT
LOOP VASCULAR MAXI 18 BLUE (MISCELLANEOUS) ×1
LOOPS VASCLR MAXI BLUE 18IN ST (MISCELLANEOUS) ×1 IMPLANT
MARKER SKIN DUAL TIP RULER LAB (MISCELLANEOUS) IMPLANT
NDL HYPO 25GX1X1/2 BEV (NEEDLE) ×1 IMPLANT
NEEDLE HYPO 25GX1X1/2 BEV (NEEDLE) ×1 IMPLANT
NS IRRIG 1000ML POUR BTL (IV SOLUTION) ×1 IMPLANT
PACK CV ACCESS (CUSTOM PROCEDURE TRAY) ×1 IMPLANT
PAD ARMBOARD 7.5X6 YLW CONV (MISCELLANEOUS) ×2 IMPLANT
SLING ARM FOAM STRAP LRG (SOFTGOODS) IMPLANT
SLING ARM FOAM STRAP MED (SOFTGOODS) IMPLANT
SLING ARM FOAM STRAP XLG (SOFTGOODS) IMPLANT
SOL PREP POV-IOD 4OZ 10% (MISCELLANEOUS) IMPLANT
SOL SCRUB PVP POV-IOD 4OZ 7.5% (MISCELLANEOUS) ×1 IMPLANT
SOLUTION SCRB POV-IOD 4OZ 7.5% (MISCELLANEOUS) IMPLANT
SPIKE FLUID TRANSFER (MISCELLANEOUS) ×1 IMPLANT
SPONGE T-LAP 18X18 ~~LOC~~+RFID (SPONGE) IMPLANT
SUT MNCRL AB 4-0 PS2 18 (SUTURE) ×1 IMPLANT
SUT PROLENE 6 0 BV (SUTURE) ×1 IMPLANT
SUT PROLENE 7 0 BV 1 (SUTURE) IMPLANT
SUT SILK 2 0 SH (SUTURE) IMPLANT
SUT SILK 2 0 SH CR/8 (SUTURE) ×1 IMPLANT
SUT VIC AB 2-0 CT1 27 (SUTURE)
SUT VIC AB 2-0 CT1 TAPERPNT 27 (SUTURE) ×1 IMPLANT
SUT VIC AB 3-0 SH 27 (SUTURE) ×3
SUT VIC AB 3-0 SH 27X BRD (SUTURE) ×2 IMPLANT
TIE VASCULAR MAXI BLUE 18IN ST (MISCELLANEOUS) ×1 IMPLANT
TOWEL GREEN STERILE (TOWEL DISPOSABLE) ×1 IMPLANT
UNDERPAD 30X36 HEAVY ABSORB (UNDERPADS AND DIAPERS) ×1 IMPLANT
VASCULAR TIE MAXI BLUE 18IN ST (MISCELLANEOUS) ×1
WATER STERILE IRR 1000ML POUR (IV SOLUTION) ×1 IMPLANT

## 2022-08-02 NOTE — Op Note (Signed)
    NAME: [name].    MRN: [id number] DOB: Aug 18, 1950    DATE OF OPERATION: 08/02/2022  PREOP DIAGNOSIS:    End stage renal disease  POSTOP DIAGNOSIS:    Same  PROCEDURE:    Right arm brachiobasilic fistula transposition  SURGEON: [name]  ASSIST: Luisa Dago, Pa  ANESTHESIA: Block   EBL: 75ml  INDICATIONS:    [name]. is a 72 y.o. male  This is a 72 y.o. male who is s/p:right BB fistula creation by Dr. [name] on 05/24/22.  He has no symptoms of steal and is on HD TTS via right IJ TDC placed by IR. After discussing hte risk and benefits of right arm fistula transposition to decrease the depth for use, [name] elected to proceed.     FINDINGS:    30mm brachiobasilic fistula with multiple branches ligated.   TECHNIQUE:   Patient was brought to the OR and laid in supine position.  Moderate anesthesia was induced. The patient was prepped and draped in standard fashion.  Lidocaine was brought to the field and a local block was performed.   The case began with right arm ultrasound fistula mapping.  Multiple branches were noted and marked.  three longitudinal skip incisions were made along the course of the basilic vein with 5 cm bridges.  This was carried through the subcutaneous fat to the brachiobasilic fistula.  The fistula was mobilized and multiple branches ligated using 2-0 silk and clips.  Once mobilized, a gore tunneler was brought on to the field, and a subcutaneous tunnel was made along the medial aspect of the bicep. Next, the patient as heparinized, fistula marked, clamped and transected. The vein was pulled through the tunnel tract and reanastomosed using 6.0 prolene suture in running fashion. The wound bed was irrigated with saline, hemostasis achieved with suture and cautery. The wounds were closed with layers of vicryl suture with monocryl and dermabond at the skin. There was a palpable thrill in the fistula at case completion with excellent pulse in  the wrist.     Given the complexity of the case,  the assistant was necessary in order to expedient the procedure and safely perform the technical aspects of the operation.  The assistant provided traction and countertraction to assist with exposure of the artery and vein.  They also assisted with suture ligation of multiple venous branches.  They played a critical role in the anastomosis. These skills, especially following the Prolene suture for the anastomosis, could not have been adequately performed by a scrub tech assistant.   Macie Burows, MD [facility] DATE OF DICTATION:   08/02/2022

## 2022-08-02 NOTE — Anesthesia Postprocedure Evaluation (Signed)
Anesthesia Post Note  Patient: [name].  Procedure(s) Performed: RIGHT ARM A-V FISTUALA BASILIC VEIN TRANSPOSITION (Right: Arm Upper)     Patient location during evaluation: PACU Anesthesia Type: Regional and MAC Level of consciousness: awake and alert Pain management: pain level controlled Vital Signs Assessment: post-procedure vital signs reviewed and stable Respiratory status: spontaneous breathing, nonlabored ventilation and respiratory function stable Cardiovascular status: stable and blood pressure returned to baseline Postop Assessment: no apparent nausea or vomiting Anesthetic complications: no  No notable events documented.  Last Vitals:  Vitals:   08/02/22 1015 08/02/22 1030  BP: (!) 153/77 (!) 151/75  Pulse: 69 63  Resp: 20 20  Temp: (!) 36.3 C 36.6 C  SpO2: 96% 96%    Last Pain:  Vitals:   08/02/22 1030  TempSrc:   PainSc: 0-No pain                 Flower Franko,W. EDMOND

## 2022-08-02 NOTE — Telephone Encounter (Signed)
-----   Message from Newt Minion, MD sent at 08/02/2022 12:34 PM EDT ----- Can you call this patient to make a follow-up appointment in the next week or 2. ----- Message ----- From: [name], MD Sent: 08/02/2022   9:48 AM EDT To: Newt Minion, MD  Hey man - can you see this guy for me in the next week or so in the outpt setting? Normal pulses, bilateral foot wounds at the 5th MTP joint.   Super nice guy,  Theatre stage manager

## 2022-08-02 NOTE — Anesthesia Procedure Notes (Addendum)
Anesthesia Regional Block: Supraclavicular block   Pre-Anesthetic Checklist: , timeout performed,  Correct Patient, Correct Site, Correct Laterality,  Correct Procedure, Correct Position, site marked,  Risks and benefits discussed,  Pre-op evaluation,  At surgeon's request and post-op pain management  Laterality: Right  Prep: Maximum Sterile Barrier Precautions used, Betadine       Needles:  Injection technique: Single-shot  Needle Type: Other      Needle Gauge: 22     Additional Needles:   Procedures:,,,, ultrasound used (permanent image in chart),,    Narrative:  Start time: 08/02/2022 7:04 AM End time: 08/02/2022 7:14 AM Injection made incrementally with aspirations every 5 mL.  Performed by: Personally  Anesthesiologist: [name], MD

## 2022-08-02 NOTE — Transfer of Care (Signed)
Immediate Anesthesia Transfer of Care Note  Patient: [name].  Procedure(s) Performed: RIGHT ARM A-V FISTUALA BASILIC VEIN TRANSPOSITION (Right: Arm Upper)  Patient Location: PACU  Anesthesia Type:MAC combined with regional for post-op pain  Level of Consciousness: drowsy and patient cooperative  Airway & Oxygen Therapy: Patient Spontanous Breathing  Post-op Assessment: Report given to RN and Post -op Vital signs reviewed and stable  Post vital signs: Reviewed and stable  Last Vitals:  Vitals Value Taken Time  BP 154/75 08/02/22 1007  Temp    Pulse 69 08/02/22 1011  Resp 27 08/02/22 1011  SpO2 97 % 08/02/22 1011  Vitals shown include unvalidated device data.  Last Pain:  Vitals:   08/02/22 0611  TempSrc: Oral  PainSc: 0-No pain         Complications: No notable events documented.

## 2022-08-02 NOTE — Discharge Instructions (Signed)
[facility]  Discharge Instructions  AV Fistula or Graft Surgery for Dialysis Access  Please refer to the following instructions for your post-procedure care. Your surgeon or physician assistant Anhad discuss any changes with you.  Activity  You may drive the day following your surgery, if you are comfortable and no longer taking prescription pain medication. Resume full activity as the soreness in your incision resolves.  Bathing/Showering  You may shower after you go home. Keep your incision dry for 48 hours. Do not soak in a bathtub, hot tub, or swim until the incision heals completely. You may not shower if you have a hemodialysis catheter.  Incision Care  Clean your incision with mild soap and water after 48 hours. Pat the area dry with a clean towel. You do not need a bandage unless otherwise instructed. Do not apply any ointments or creams to your incision. You may have skin glue on your incision. Do not peel it off. It Awad come off on its own in about one week. Your arm may swell a bit after surgery. To reduce swelling use pillows to elevate your arm so it is above your heart. Your doctor Jasiri tell you if you need to lightly wrap your arm with an ACE bandage.  Diet  Resume your normal diet. There are not special food restrictions following this procedure. In order to heal from your surgery, it is CRITICAL to get adequate nutrition. Your body requires vitamins, minerals, and protein. Vegetables are the best source of vitamins and minerals. Vegetables also provide the perfect balance of protein. Processed food has little nutritional value, so try to avoid this.  Medications  Resume taking all of your medications. If your incision is causing pain, you may take over-the counter pain relievers such as acetaminophen (Tylenol). If you were prescribed a stronger pain medication, please be aware these medications can cause nausea and constipation. Prevent  nausea by taking the medication with a snack or meal. Avoid constipation by drinking plenty of fluids and eating foods with high amount of fiber, such as fruits, vegetables, and grains.  Do not take Tylenol if you are taking prescription pain medications.  Follow up Your surgeon may want to see you in the office following your access surgery. If so, this [place] be arranged at the time of your surgery.  Please call us immediately for any of the following conditions:  Increased pain, redness, drainage (pus) from your incision site Fever of 101 degrees or higher Severe or worsening pain at your incision site Hand pain or numbness.  Reduce your risk of vascular disease:  Stop smoking. If you would like help, call [facility] at 1-800-QUIT-NOW 671-441-1152) or [place] at [place] your cholesterol Maintain a desired weight Control your diabetes Keep your blood pressure down  Dialysis  It Tylan take several weeks to several months for your new dialysis access to be ready for use. Your surgeon Resean determine when it is okay to use it. Your nephrologist Haidar continue to direct your dialysis. You can continue to use your Permcath until your new access is ready for use.   08/02/2022 Bronsen Lenord Fellers [id number] 03-Feb-1951  Surgeon(s): [name], MD  Procedure(s): RIGHT ARM AV FISTULA BASILIC VEIN TRANSPOSITION   May stick graft immediately   May stick graft on designated area only:   x Do not stick fistula for 4 weeks    If you have any questions, please call the office at  [phone].  

## 2022-08-02 NOTE — Progress Notes (Signed)
POST OPERATIVE OFFICE NOTE  Patient seen and examined in preop holding.  No complaints. No changes to medication history or physical exam since last seen in clinic. After discussing the risks and benefits of right arm fistula transposition, [name]. elected to proceed.   [name] MD   CC:  F/u for surgery  HPI:  This is a 72 y.o. male who is ESRD s/p right BB AV fistula on 05/24/22 by Dr. [name].  He had a previous  left radiocephaic arteriovenous fistula placement that failed to mature. He has a working right IJ TDC.    Pt returns today for follow up.  Pt states he has no loss of motor, sensation or pain out of the ordinary.     Allergies  Allergen Reactions   Lisinopril Swelling    Angioedema to lisinopril documented in chart. Time of reaction unknown.    Other Other (See Comments)    Seasonal allergies    Current Facility-Administered Medications  Medication Dose Route Frequency Provider Last Rate Last Admin   0.9 %  sodium chloride infusion   Intravenous Continuous [name], MD   New Bag at 08/02/22 0700   0.9 %  sodium chloride infusion   Intravenous Continuous [name], MD       ceFAZolin (ANCEF) IVPB 2g/100 mL premix  2 g Intravenous 30 min Pre-Op [name], MD       chlorhexidine (HIBICLENS) 4 % liquid 4 Application  60 mL Topical Once [name], MD       And   [START ON 08/03/2022] chlorhexidine (HIBICLENS) 4 % liquid 4 Application  60 mL Topical Once [name], MD       insulin aspart (novoLOG) injection 0-7 Units  0-7 Units Subcutaneous Q2H PRN [name], MD       Facility-Administered Medications Ordered in Other Encounters  Medication Dose Route Frequency Provider Last Rate Last Admin   fentaNYL (SUBLIMAZE) injection   Intravenous Anesthesia Intra-op [name], CRNA   50 mcg at 08/02/22 0709   midazolam (VERSED) injection   Intravenous Anesthesia Intra-op [name], CRNA   1 mg at 08/02/22  0709     ROS:  See HPI  Physical Exam:   Findings:  +--------------------+----------+-----------------+--------+  AVF                PSV (cm/s)Flow Vol (mL/min)Comments  +--------------------+----------+-----------------+--------+  Native artery inflow   146          1029                 +--------------------+----------+-----------------+--------+  AVF Anastomosis        247                               +--------------------+----------+-----------------+--------+     +------------+---------+------------+----------+---------------------------  ----+  OUTFLOW VEIN   PSV     Diameter  Depth (cm)           Describe                            (cm/s)      (cm)                                                +------------+---------+------------+----------+---------------------------  ----+  Prox UA        127       0.64       1.01                                     +------------+---------+------------+----------+---------------------------  ----+  Mid UA         162       0.64       1.01                                     +------------+---------+------------+----------+---------------------------  ----+  Dist UA        241       0.69       0.84   competing branch measuring  0.17  +------------+---------+------------+----------+---------------------------  ----+  AC Fossa       371       0.51       0.35     competing branch  measuring                                                            0.27cm                +------------+---------+------------+----------+---------------------------  ----+        Summary:  Patent arteriovenous fistula. Competing branches as mentioned above,  largest measuring 0.27cm.     Incision:  well healed Extremities:  n/m/v intact Neuro: sensation intact Right TDC in place    Assessment/Plan:  This is a 72 y.o. male who is s/p:right AV fistula creation BC by Dr. [name] on  05/24/22.  He has no symptoms of steal and is on HD TTS via right IJ TDC placed by  IR.     The fistula is deeper than 0.6 cm, the diameter is > 0.6 above the anastomosis.  The fistula is maturing well, however it is too deep to access. [name] schedule him for second stage basilic transposition.  He is on Eliquis which Diezel be held.  Plan for Dr. [name] to perform surgery in the next 1-2 weeks.     [name] PA-C [facility] [phone]   Clinic MD:  [name]

## 2022-08-02 NOTE — Progress Notes (Signed)
Orthopedic Tech Progress Note Patient Details:  [name]. 06/02/1950 [id number] XL post-op shoe was delivered to the patient while in PACU Ortho Devices Type of Ortho Device: Postop shoe/boot Ortho Device/Splint Location: Right foot Ortho Device/Splint Interventions: Ordered      [name] [name] 08/02/2022, 10:41 AM

## 2022-08-02 NOTE — Telephone Encounter (Signed)
I called and sw pt and his son and they Braven come in next Monday 08/09/2022 at 9:30

## 2022-08-03 ENCOUNTER — Encounter (HOSPITAL_COMMUNITY): Payer: Self-pay | Admitting: Vascular Surgery

## 2022-08-03 NOTE — H&P (Signed)
POST OPERATIVE OFFICE NOTE  Patient seen and examined in preop holding.  No complaints. No changes to medication history or physical exam since last seen in clinic. After discussing the risks and benefits of right arm fistula transposition, [name]. elected to proceed.   Lucas John MD   CC:  F/u for surgery  HPI:  This is a 72 y.o. male who is ESRD s/p right BB AV fistula on 05/24/22 by Dr. [name].  He had a previous  left radiocephaic arteriovenous fistula placement that failed to mature. He has a working right IJ TDC.    Pt returns today for follow up.  Pt states he has no loss of motor, sensation or pain out of the ordinary.     Allergies  Allergen Reactions   Lisinopril Swelling    Angioedema to lisinopril documented in chart. Time of reaction unknown.    Other Other (See Comments)    Seasonal allergies    Current Facility-Administered Medications  Medication Dose Route Frequency Provider Last Rate Last Admin   0.9 %  sodium chloride infusion  250 mL Intravenous PRN Angelia Mould, MD       sodium chloride flush (NS) 0.9 % injection 3 mL  3 mL Intravenous PRN Angelia Mould, MD       Current Outpatient Medications  Medication Sig Dispense Refill   amiodarone (PACERONE) 200 MG tablet Take 1 tablet (200 mg total) by mouth daily. 90 tablet 3   Ascorbic Acid (VITAMIN C PO) Take 1 tablet by mouth daily.     b complex vitamins tablet Take 1 tablet by mouth daily.     carvedilol (COREG) 25 MG tablet Take 1 tablet (25 mg total) by mouth 2 (two) times daily. 180 tablet 3   Cholecalciferol (VITAMIN D3) 50 MCG (2000 UT) capsule Take 2,000 Units by mouth daily.     Garlic 123XX123 MG CAPS Take 1,000 mg by mouth daily.     OVER THE COUNTER MEDICATION Take 800 mg by mouth daily. Kale     oxyCODONE-acetaminophen (PERCOCET) 5-325 MG tablet Take 1 tablet by mouth every 4 (four) hours as needed for severe pain. 15 tablet 0   Prasterone, DHEA, (DHEA PO) Take 1 capsule  by mouth daily.     saw palmetto 160 MG capsule Take 160 mg by mouth daily.     amiodarone (PACERONE) 200 MG tablet Take 1 tablet (200 mg total) by mouth 2 (two) times daily. (Patient not taking: Reported on 07/28/2022) 60 tablet 6   amLODipine (NORVASC) 2.5 MG tablet Take 1 tablet (2.5 mg total) by mouth Nightly. (Patient not taking: Reported on 07/14/2022) 90 tablet 3   apixaban (ELIQUIS) 5 MG TABS tablet NEW PRESCRIPTION REQUEST: ELIQUIS 5MG  - TAKE ONE TABLET BY MOUTH TWICE DAILY (Patient not taking: Reported on 07/28/2022) 180 tablet 3   apixaban (ELIQUIS) 5 MG TABS tablet Take 1 tablet (5 mg total) by mouth 2 (two) times daily. 180 tablet 1   Biotin 1000 MCG tablet Take 1,000 mcg by mouth daily.     blood glucose meter kit and supplies KIT Dispense based on patient and insurance preference. Use to check blood sugars at least twice a day --before meals and/or at bedtime. 1 each 0   Continuous Blood Gluc Sensor (FREESTYLE LIBRE 14 DAY SENSOR) MISC 1 application. by Does not apply route every 14 (fourteen) days. 2 each 3   Menthol-Methyl Salicylate (MUSCLE RUB) 10-15 % CREA Apply 1 application. topically as  needed for muscle pain. (Patient not taking: Reported on 07/28/2022) 85 g 0   multivitamin (RENA-VIT) TABS tablet TAKE 1 TABLET BY MOUTH AT BEDTIME (Patient not taking: Reported on 07/28/2022) 30 tablet 0     ROS:  See HPI  Physical Exam:   Findings:  +--------------------+----------+-----------------+--------+  AVF                PSV (cm/s)Flow Vol (mL/min)Comments  +--------------------+----------+-----------------+--------+  Native artery inflow   146          1029                 +--------------------+----------+-----------------+--------+  AVF Anastomosis        247                               +--------------------+----------+-----------------+--------+     +------------+---------+------------+----------+---------------------------  ----+  OUTFLOW VEIN   PSV      Diameter  Depth (cm)           Describe                            (cm/s)      (cm)                                                +------------+---------+------------+----------+---------------------------  ----+  Prox UA        127       0.64       1.01                                     +------------+---------+------------+----------+---------------------------  ----+  Mid UA         162       0.64       1.01                                     +------------+---------+------------+----------+---------------------------  ----+  Dist UA        241       0.69       0.84   competing branch measuring  0.17  +------------+---------+------------+----------+---------------------------  ----+  AC Fossa       371       0.51       0.35     competing branch  measuring                                                            0.27cm                +------------+---------+------------+----------+---------------------------  ----+        Summary:  Patent arteriovenous fistula. Competing branches as mentioned above,  largest measuring 0.27cm.     Incision:  well healed Extremities:  n/m/v intact Neuro: sensation intact Right TDC in place    Assessment/Plan:  This is a 72 y.o. male who is s/p:right AV fistula creation BC  by Dr. [name] on 05/24/22.  He has no symptoms of steal and is on HD TTS via right IJ TDC placed by  IR.     The fistula is deeper than 0.6 cm, the diameter is > 0.6 above the anastomosis.  The fistula is maturing well, however it is too deep to access. I [place] schedule him for second stage basilic transposition.  He is on Eliquis which Abimael be held.  Plan for Dr. [name] to perform surgery in the next 1-2 weeks.     Lucas John PA-C [facility] [phone]   Clinic MD:  [name]

## 2022-08-09 ENCOUNTER — Ambulatory Visit (INDEPENDENT_AMBULATORY_CARE_PROVIDER_SITE_OTHER): Payer: Medicare (Managed Care)

## 2022-08-09 ENCOUNTER — Ambulatory Visit (INDEPENDENT_AMBULATORY_CARE_PROVIDER_SITE_OTHER): Payer: Medicare (Managed Care) | Admitting: Orthopedic Surgery

## 2022-08-09 ENCOUNTER — Encounter: Payer: Self-pay | Admitting: Orthopedic Surgery

## 2022-08-09 DIAGNOSIS — S91302A Unspecified open wound, left foot, initial encounter: Secondary | ICD-10-CM

## 2022-08-09 DIAGNOSIS — S91301A Unspecified open wound, right foot, initial encounter: Secondary | ICD-10-CM

## 2022-08-09 DIAGNOSIS — L608 Other nail disorders: Secondary | ICD-10-CM | POA: Diagnosis not present

## 2022-08-09 DIAGNOSIS — B351 Tinea unguium: Secondary | ICD-10-CM | POA: Diagnosis not present

## 2022-08-09 MED ORDER — DOXYCYCLINE HYCLATE 100 MG PO TABS: 100.0000 mg | ORAL_TABLET | Freq: Two times a day (BID) | ORAL | 0 refills | Status: DC
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
## 2022-08-20 ENCOUNTER — Telehealth: Payer: Self-pay

## 2022-08-20 NOTE — Telephone Encounter (Signed)
Patient's wife calls nurse line regarding concerns with Eliquis cost. Reports that patient has to pay $500 for deductible before he would be able to get medication.  Patient only has one pill left.   Spoke with Dr. Raymondo Band regarding patient. Advised that we could provide 2 sample boxes of Eliquis 5 mg for patient.   Lot: [id number] Exp: 12/15/2023  Wife states that she Zakarie try to pick up medication today.   Forwarding to [facility] for patient assistance for medication.   Veronda Prude, RN

## 2022-08-23 ENCOUNTER — Other Ambulatory Visit (HOSPITAL_COMMUNITY): Payer: Self-pay

## 2022-08-23 NOTE — Telephone Encounter (Signed)
Samples given to daughter 

## 2022-08-24 NOTE — Progress Notes (Signed)
POST OPERATIVE OFFICE NOTE    CC:  F/u for surgery  HPI:  This is a 72 y.o. 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 Karin Lieuobins.  Right 1st stage BVT was on 05/24/2022 also by Dr. Karin Lieuobins. He has a TDC that was placed by IR on 08/05/2021  HD access hx: -left RC AVF 08/17/2021 Dr. Chestine Sporelark -fistulogram 02/24/2022 Dr. Chestine Sporelark -right 1st stage BVT 05/24/2022 Dr. Karin Lieuobins -right 2nd stage BVT 08/02/2022 Dr. Karin Lieuobins  Pt states he does not have pain/numbness in the right hand.  He states he did have some numbness on the back of the right forearm but this has gotten better.   The pt is on dialysis T/T/S [address] [facility].   Allergies  Allergen Reactions   Lisinopril Swelling    Angioedema to lisinopril documented in chart. Time of reaction unknown.    Other Other (See Comments)    Seasonal allergies    Current Outpatient Medications  Medication Sig Dispense Refill   amiodarone (PACERONE) 200 MG tablet Take 1 tablet (200 mg total) by mouth 2 (two) times daily. (Patient not taking: Reported on 07/28/2022) 60 tablet 6   amiodarone (PACERONE) 200 MG tablet Take 1 tablet (200 mg total) by mouth daily. 90 tablet 3   amLODipine (NORVASC) 2.5 MG tablet Take 1 tablet (2.5 mg total) by mouth Nightly. (Patient not taking: Reported on 07/14/2022) 90 tablet 3   apixaban (ELIQUIS) 5 MG TABS tablet NEW PRESCRIPTION REQUEST: ELIQUIS 5MG  - TAKE ONE TABLET BY MOUTH TWICE DAILY (Patient not taking: Reported on 07/28/2022) 180 tablet 3   apixaban (ELIQUIS) 5 MG TABS tablet Take 1 tablet (5 mg total) by mouth 2 (two) times daily. 180 tablet 1   Ascorbic Acid (VITAMIN C PO) Take 1 tablet by mouth daily.     b complex vitamins tablet Take 1 tablet by mouth daily.     Biotin 1000 MCG tablet Take 1,000 mcg by mouth daily.     blood glucose meter kit and supplies KIT Dispense based on patient and insurance preference. Use to check blood sugars at least twice a day --before meals and/or at bedtime. 1 each 0    carvedilol (COREG) 25 MG tablet Take 1 tablet (25 mg total) by mouth 2 (two) times daily. 180 tablet 3   Cholecalciferol (VITAMIN D3) 50 MCG (2000 UT) capsule Take 2,000 Units by mouth daily.     Continuous Blood Gluc Sensor (FREESTYLE LIBRE 14 DAY SENSOR) MISC 1 application. by Does not apply route every 14 (fourteen) days. 2 each 3   doxycycline (VIBRA-TABS) 100 MG tablet Take 1 tablet (100 mg total) by mouth 2 (two) times daily. 30 tablet 0   Garlic 1000 MG CAPS Take 1,000 mg by mouth daily.     Menthol-Methyl Salicylate (MUSCLE RUB) 10-15 % CREA Apply 1 application. topically as needed for muscle pain. (Patient not taking: Reported on 07/28/2022) 85 g 0   multivitamin (RENA-VIT) TABS tablet TAKE 1 TABLET BY MOUTH AT BEDTIME (Patient not taking: Reported on 07/28/2022) 30 tablet 0   OVER THE COUNTER MEDICATION Take 800 mg by mouth daily. Kale     oxyCODONE-acetaminophen (PERCOCET) 5-325 MG tablet Take 1 tablet by mouth every 4 (four) hours as needed for severe pain. 15 tablet 0   Prasterone, DHEA, (DHEA PO) Take 1 capsule by mouth daily.     saw palmetto 160 MG capsule Take 160 mg by mouth daily.     Current Facility-Administered  Medications  Medication Dose Route Frequency Provider Last Rate Last Admin   0.9 %  sodium chloride infusion  250 mL Intravenous PRN Chuck Hint, MD       sodium chloride flush (NS) 0.9 % injection 3 mL  3 mL Intravenous PRN Chuck Hint, MD         ROS:  See HPI  Physical Exam:  Today's Vitals   08/27/22 0856  BP: 130/76  Pulse: 74  Resp: 20  Temp: 98.6 F (37 C)  TempSrc: Temporal  SpO2: 99%  Weight: 177 lb 11.2 oz (80.6 kg)  Height: 5\' 9"  (1.753 m)   Body mass index is 26.24 kg/m.   Incision:  all incisions healing nicely Extremities:   There is a palpable right radial pulse.   Motor and sensory are in tact.   There is a thrill/bruit present.  Access is  easily palpable    Assessment/Plan:  This is a 72 y.o. 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 Karin Lieu.  Right 1st stage BVT was on 05/24/2022 also by Dr. Karin Lieu.   -the pt does not have evidence of steal. -pt's access can be used 09/02/2022. -his  tunneled catheter can be removed at the discretion of the dialysis center once the pt's access has been successfully cannulated to their satisfaction.  -discussed with pt that access does not last forever and Khyron need intervention or even new access at some point.  -the pt [place] follow up as needed   Doreatha Massed, [facility] [phone]  Clinic MD:  Karin Lieu

## 2022-08-27 ENCOUNTER — Encounter: Payer: Self-pay | Admitting: Physician Assistant

## 2022-08-27 ENCOUNTER — Ambulatory Visit (INDEPENDENT_AMBULATORY_CARE_PROVIDER_SITE_OTHER): Payer: Medicare (Managed Care) | Admitting: Physician Assistant

## 2022-08-27 VITALS — BP 130/76 | HR 74 | Temp 98.6°F | Resp 20 | Ht 69.0 in | Wt 177.7 lb

## 2022-08-27 DIAGNOSIS — Z992 Dependence on renal dialysis: Secondary | ICD-10-CM

## 2022-08-27 DIAGNOSIS — N186 End stage renal disease: Secondary | ICD-10-CM

## 2022-08-30 ENCOUNTER — Ambulatory Visit (INDEPENDENT_AMBULATORY_CARE_PROVIDER_SITE_OTHER): Payer: Medicare (Managed Care) | Admitting: Orthopedic Surgery

## 2022-08-30 DIAGNOSIS — B351 Tinea unguium: Secondary | ICD-10-CM

## 2022-08-30 DIAGNOSIS — S91302A Unspecified open wound, left foot, initial encounter: Secondary | ICD-10-CM | POA: Diagnosis not present

## 2022-08-30 DIAGNOSIS — S91301A Unspecified open wound, right foot, initial encounter: Secondary | ICD-10-CM

## 2022-09-05 ENCOUNTER — Encounter: Payer: Self-pay | Admitting: Orthopedic Surgery
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
## 2022-09-27 ENCOUNTER — Ambulatory Visit (INDEPENDENT_AMBULATORY_CARE_PROVIDER_SITE_OTHER): Payer: Medicare (Managed Care) | Admitting: Orthopedic Surgery

## 2022-09-27 ENCOUNTER — Encounter: Payer: Self-pay | Admitting: Orthopedic Surgery

## 2022-09-27 DIAGNOSIS — S91301A Unspecified open wound, right foot, initial encounter: Secondary | ICD-10-CM

## 2022-09-27 DIAGNOSIS — S91302A Unspecified open wound, left foot, initial encounter: Secondary | ICD-10-CM
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
## 2022-10-12 ENCOUNTER — Ambulatory Visit (HOSPITAL_COMMUNITY): Payer: Medicare (Managed Care)

## 2022-10-12 ENCOUNTER — Encounter (HOSPITAL_COMMUNITY): Payer: Medicare (Managed Care) | Admitting: Internal Medicine

## 2022-10-13 ENCOUNTER — Telehealth: Payer: Self-pay | Admitting: Pharmacist

## 2022-10-13 NOTE — Telephone Encounter (Signed)
[name] calls nurse line requesting more samples of Eliquis.  She reports if the application for medication assistance was sent to his house he more than likely threw it away.   She reports they live separately and asks we mail another application to her home address. She reports she does not drive and is not able to come and pick this up.   [name]  [address] [place] [address]  [name] forward to [place] and Pharmacy Team for sample assistance.

## 2022-10-13 NOTE — Telephone Encounter (Signed)
Contacted spouse, Sheralyn Boatman, RE supply of Eliquis (apixaban) for Afib.   Patient, [name], has been out of this medication for several days.   Needs another application for MAP supply.  Sample supply Kelsey be provided to cover the [facility].   Family member Wentworth come to the office tomorrow 10/14/2022 for pick up of sample AND new copy of MAP application.   Coordinating with Siri Cole, CPhT.   Medication Samples have been provided to the patient.  Drug name: Apixaban       Strength: 5mg         Qty: 42 tablets (3 week supply)  LOT: [id number]  Exp.Date: 11/14/2023  Dosing instructions: 1 tablet twice daily  The patient has been instructed regarding the correct time, dose, and frequency of taking this medication, including desired effects and most common side effects.   Madelon Lips 11:17 AM 10/13/2022

## 2022-10-13 NOTE — Telephone Encounter (Signed)
CORRECTION!  Pt's family member Nicholos be coming by office 10/14/22 to pickup application for patient. Form Amanuel be with samples.

## 2022-10-26 NOTE — Telephone Encounter (Signed)
Left message for patient to return completed paperwork to office.   Call back [phone]

## 2022-10-26 NOTE — Telephone Encounter (Signed)
Sheralyn Boatman returns call to nurse line regarding patient assistance.   She is asking if she should mail this directly to the company or if she needs to bring this back to our office for further processing.   Jaeshawn forward to [place].   Veronda Prude, RN

## 2022-10-28 ENCOUNTER — Ambulatory Visit: Payer: Medicare (Managed Care) | Admitting: Orthopedic Surgery

## 2022-11-01 NOTE — Telephone Encounter (Signed)
Wife LVM on nurse line in regards to paperwork.   Wife advised to drop paperwork off at our office.   She reports she [place] need to mail it to [place].   Address given.

## 2022-11-24 ENCOUNTER — Ambulatory Visit (HOSPITAL_COMMUNITY): Admission: RE | Admit: 2022-11-24 | Payer: Medicare (Managed Care) | Source: Ambulatory Visit

## 2022-11-24 ENCOUNTER — Encounter (HOSPITAL_COMMUNITY): Payer: Medicare (Managed Care) | Admitting: Internal Medicine

## 2022-11-30 ENCOUNTER — Telehealth: Payer: Self-pay

## 2022-11-30 ENCOUNTER — Other Ambulatory Visit (HOSPITAL_COMMUNITY): Payer: Self-pay

## 2022-11-30 NOTE — Telephone Encounter (Signed)
Submitted application for ELIQUIS to [employer]) for patient assistance.   (Submitted w/o oop expenses for 2024)  Phone: [phone]

## 2022-12-29 ENCOUNTER — Other Ambulatory Visit: Payer: Self-pay | Admitting: Family Medicine

## 2022-12-29 ENCOUNTER — Other Ambulatory Visit (HOSPITAL_COMMUNITY): Payer: Self-pay

## 2022-12-29 DIAGNOSIS — I482 Chronic atrial fibrillation, unspecified: Secondary | ICD-10-CM

## 2022-12-30 ENCOUNTER — Other Ambulatory Visit (HOSPITAL_COMMUNITY): Payer: Self-pay

## 2022-12-30 MED ORDER — APIXABAN 5 MG PO TABS
5.0000 mg | ORAL_TABLET | Freq: Two times a day (BID) | ORAL | 5 refills | Status: DC
  Filled 2022-12-30 – 2023-01-07 (×3): qty 60, 30d supply, fill #0
  Filled 2023-01-28: qty 28, 14d supply, fill #0
  Filled 2023-01-28 (×3): qty 60, 30d supply, fill #1

## 2022-12-31 ENCOUNTER — Other Ambulatory Visit (HOSPITAL_COMMUNITY): Payer: Self-pay

## 2023-01-04 ENCOUNTER — Other Ambulatory Visit (HOSPITAL_COMMUNITY): Payer: Self-pay

## 2023-01-05 ENCOUNTER — Other Ambulatory Visit: Payer: Self-pay

## 2023-01-05 ENCOUNTER — Other Ambulatory Visit (HOSPITAL_COMMUNITY): Payer: Self-pay

## 2023-01-06 ENCOUNTER — Other Ambulatory Visit (HOSPITAL_COMMUNITY): Payer: Self-pay

## 2023-01-06 ENCOUNTER — Other Ambulatory Visit (HOSPITAL_BASED_OUTPATIENT_CLINIC_OR_DEPARTMENT_OTHER): Payer: Self-pay

## 2023-01-06 ENCOUNTER — Other Ambulatory Visit: Payer: Self-pay

## 2023-01-07 ENCOUNTER — Other Ambulatory Visit (HOSPITAL_COMMUNITY): Payer: Self-pay

## 2023-01-07 ENCOUNTER — Other Ambulatory Visit: Payer: Self-pay

## 2023-01-11 ENCOUNTER — Other Ambulatory Visit: Payer: Self-pay

## 2023-01-21 NOTE — Telephone Encounter (Signed)
Called [employer] to follow up on application status. Patient currently pending denial until 3% out of pocket expenses are met. Pt needs to have spent $715.29 on meds this year.  Attempted to call patient, voicemail full.  Left message with Kelly Splinter requesting call back regarding PAP documents needed.

## 2023-01-21 NOTE — Telephone Encounter (Signed)
Pt's wife returned call  She [place] help get patients oop expenses from [employer] and mail order pharmacy.

## 2023-01-28 ENCOUNTER — Ambulatory Visit (INDEPENDENT_AMBULATORY_CARE_PROVIDER_SITE_OTHER): Payer: Medicare (Managed Care)

## 2023-01-28 ENCOUNTER — Other Ambulatory Visit: Payer: Self-pay

## 2023-01-28 ENCOUNTER — Ambulatory Visit: Payer: Self-pay | Admitting: *Deleted

## 2023-01-28 ENCOUNTER — Other Ambulatory Visit (HOSPITAL_COMMUNITY): Payer: Self-pay

## 2023-01-28 VITALS — Ht 69.0 in | Wt 177.0 lb

## 2023-01-28 DIAGNOSIS — Z0000 Encounter for general adult medical examination without abnormal findings: Secondary | ICD-10-CM | POA: Diagnosis not present

## 2023-01-28 MED ORDER — CARVEDILOL 25 MG PO TABS
25.0000 mg | ORAL_TABLET | Freq: Two times a day (BID) | ORAL | 3 refills | Status: DC
  Filled 2023-01-28: qty 180, 90d supply, fill #0
  Filled 2023-06-28: qty 180, 90d supply, fill #1

## 2023-01-28 MED ORDER — AMLODIPINE BESYLATE 2.5 MG PO TABS
2.5000 mg | ORAL_TABLET | Freq: Every evening | ORAL | 3 refills | Status: DC
  Filled 2023-01-28 (×2): qty 90, 90d supply, fill #0

## 2023-01-28 NOTE — Telephone Encounter (Signed)
Message from [place] B sent at 01/28/2023  3:16 PM EDT  Summary: directions for meds   Pt's wife called in clarify directions of taking meds of amlodipine and carvedilol (COREG) 25 MG tablet          Call History  Contact Date/Time Type Contact Phone/Fax User  01/28/2023 03:14 PM EDT Phone (Incoming) Kaatz,Toni (Emergency Contact) [phone] Rexene Edison) [place], [place]   Reason for Disposition  Caller has medicine question only, adult not sick, AND triager answers question  Answer Assessment - Initial Assessment Questions 1. NAME of MEDICINE: "What medicine(s) are you calling about?"     I returned call and wife answered.    2. QUESTION: "What is your question?" (e.g., double dose of medicine, side effect)     He needs to know how to take the amlodipine and Coreg.    3. PRESCRIBER: "Who prescribed the medicine?" Reason: if prescribed by specialist, call should be referred to that group.     Dr. From [facility].    4. SYMPTOMS: "Do you have any symptoms?" If Yes, ask: "What symptoms are you having?"  "How bad are the symptoms (e.g., mild, moderate, severe)     N/A 5. PREGNANCY:  "Is there any chance that you are pregnant?" "When was your last menstrual period?"     N/A  Protocols used: Medication Question Call-A-AH

## 2023-01-28 NOTE — Patient Instructions (Addendum)
[name] , Thank you for taking time to come for your Medicare Wellness Visit. I appreciate your ongoing commitment to your health goals. Please review the following plan we discussed and let me know if I can assist you in the future.   Referrals/Orders/Follow-Ups/Clinician Recommendations: Aim for 30 minutes of exercise or brisk walking, 6-8 glasses of water, and 5 servings of fruits and vegetables each day.  This is a list of the screening recommended for you and due dates:  Health Maintenance  Topic Date Due   Eye exam for diabetics  Never done   DTaP/Tdap/Td vaccine (1 - Tdap) Never done   Colon Cancer Screening  Never done   Zoster (Shingles) Vaccine (1 of 2) Never done   Complete foot exam   11/28/2020   Hemoglobin A1C  04/24/2022   Flu Shot  12/16/2022   COVID-19 Vaccine (4 - 2023-24 season) 01/16/2023   Medicare Annual Wellness Visit  01/28/2024   Pneumonia Vaccine  Completed   Hepatitis C Screening  Completed   HPV Vaccine  Aged Out    Advanced directives: (ACP Link)Information on Advanced Care Planning can be found at [facility] Advance Health Care Directives Advance Health Care Directives ([email or web address]   Next Medicare Annual Wellness Visit scheduled for next year: Yes

## 2023-01-28 NOTE — Telephone Encounter (Signed)
  Chief Complaint: Needed to know how husband was to take the amlodipine and Coreg.   "He is forgetful".   "He doesn't remember who his dr. Is".   Symptoms: N/A Frequency: N/A Pertinent Negatives: Patient denies N/A Disposition: [] ED /[] Urgent Care (no appt availability in office) / [] Appointment(In office/virtual)/ []  [facility]/ [x] Home Care/ [] Refused Recommended Disposition /[] [place] [facility]/ []  Follow-up with PCP Additional Notes: I answered her questions by referencing his chart.   She thanked me very much for answering her questions.

## 2023-01-28 NOTE — Progress Notes (Signed)
Subjective:   [name]. is a 72 y.o. male who presents for an Initial Medicare Annual Wellness Visit.  Visit Complete: Virtual  I connected with  [name]. on 01/28/23 by a audio enabled telemedicine application and verified that I am speaking with the correct person using two identifiers.  Patient Location: Home  Provider Location: Home Office  I discussed the limitations of evaluation and management by telemedicine. The patient expressed understanding and agreed to proceed.  Vital Signs: Because this visit was a virtual/telehealth visit, some criteria may be missing or patient reported. Any vitals not documented were not able to be obtained and vitals that have been documented are patient reported.   Cardiac Risk Factors include: advanced age (>38men, >5 women);hypertension;male gender;dyslipidemia;diabetes mellitus     Objective:    Today's Vitals   01/28/23 1148  Weight: 177 lb (80.3 kg)  Height: 5\' 9"  (1.753 m)   Body mass index is 26.14 kg/m.     01/28/2023    2:39 PM 02/24/2022   10:05 AM 08/14/2021    6:11 PM 07/27/2021   10:57 PM 07/27/2021   10:12 PM 06/17/2021    1:33 PM 10/16/2020   10:21 AM  Advanced Directives  Does Patient Have a Medical Advance Directive? Yes No No No No No Yes  Type of Estate agent of [place];Living [place]      Healthcare Power of Attorney  Does patient want to make changes to medical advance directive? No - Patient declined        Copy of Healthcare Power of Attorney in Chart? Yes - validated most recent copy scanned in chart (See row information)      No - copy requested  Would patient like information on creating a medical advance directive?  No - Patient declined No - Patient declined No - Patient declined No - Patient declined No - Patient declined     Current Medications (verified) Outpatient Encounter Medications as of 01/28/2023  Medication Sig   amiodarone (PACERONE) 200 MG tablet Take 1 tablet (200  mg total) by mouth 2 (two) times daily.   amiodarone (PACERONE) 200 MG tablet Take 1 tablet (200 mg total) by mouth daily.   amLODipine (NORVASC) 2.5 MG tablet Take 1 tablet (2.5 mg total) by mouth Nightly.   apixaban (ELIQUIS) 5 MG TABS tablet Take 1 tablet (5 mg total) by mouth 2 (two) times daily.   Ascorbic Acid (VITAMIN C PO) Take 1 tablet by mouth daily.   b complex vitamins tablet Take 1 tablet by mouth daily.   B Complex-C-Zn-Folic Acid (DIALYVITE/ZINC) TABS Take 1 tablet by mouth daily.   Biotin 1000 MCG tablet Take 1,000 mcg by mouth daily.   blood glucose meter kit and supplies KIT Dispense based on patient and insurance preference. Use to check blood sugars at least twice a day --before meals and/or at bedtime.   carvedilol (COREG) 25 MG tablet Take 1 tablet (25 mg total) by mouth 2 (two) times daily.   Cholecalciferol (VITAMIN D3) 50 MCG (2000 UT) capsule Take 2,000 Units by mouth daily.   Continuous Blood Gluc Sensor (FREESTYLE LIBRE 14 DAY SENSOR) MISC 1 application. by Does not apply route every 14 (fourteen) days.   Garlic 1000 MG CAPS Take 1,000 mg by mouth daily.   Menthol-Methyl Salicylate (MUSCLE RUB) 10-15 % CREA Apply 1 application. topically as needed for muscle pain.   Methoxy PEG-Epoetin Beta (MIRCERA IJ) Mircera   multivitamin (RENA-VIT) TABS tablet TAKE  1 TABLET BY MOUTH AT BEDTIME   OVER THE COUNTER MEDICATION Take 800 mg by mouth daily. Kale   oxyCODONE-acetaminophen (PERCOCET) 5-325 MG tablet Take 1 tablet by mouth every 4 (four) hours as needed for severe pain.   Prasterone, DHEA, (DHEA PO) Take 1 capsule by mouth daily.   RENVELA 800 MG tablet Take by mouth.   saw palmetto 160 MG capsule Take 160 mg by mouth daily.   [DISCONTINUED] amLODipine (NORVASC) 2.5 MG tablet Take 1 tablet (2.5 mg total) by mouth Nightly.   [DISCONTINUED] apixaban (ELIQUIS) 5 MG TABS tablet NEW PRESCRIPTION REQUEST: ELIQUIS 5MG  - TAKE ONE TABLET BY MOUTH TWICE DAILY (Patient not taking:  Reported on 10/13/2022)   [DISCONTINUED] carvedilol (COREG) 25 MG tablet Take 1 tablet (25 mg total) by mouth 2 (two) times daily.   [DISCONTINUED] doxycycline (VIBRA-TABS) 100 MG tablet Take 1 tablet (100 mg total) by mouth 2 (two) times daily.   Facility-Administered Encounter Medications as of 01/28/2023  Medication   0.9 %  sodium chloride infusion   sodium chloride flush (NS) 0.9 % injection 3 mL    Allergies (verified) Lisinopril and Other   History: Past Medical History:  Diagnosis Date   Acute respiratory failure with hypoxia (HCC) 07/28/2021   Atrial fibrillation (HCC)    Cardiac arrest (HCC) 07/27/2021   CHF (congestive heart failure) (HCC)    Diabetes mellitus without complication (HCC)    Dysrhythmia    Heart failure with reduced ejection fraction (HCC)    Hypertension    Hyponatremia 04/14/2019   NSVT (nonsustained ventricular tachycardia) (HCC) 04/14/2019   Pneumonia 07/2021   Renal insufficiency 04/14/2019   Past Surgical History:  Procedure Laterality Date   A/V FISTULAGRAM Left 02/24/2022   Procedure: A/V Fistulagram;  Surgeon: Victorino Sparrow, MD;  Location: [facility] INVASIVE CV LAB;  Service: Cardiovascular;  Laterality: Left;   AV FISTULA PLACEMENT Left 08/17/2021   Procedure: RADIOCEPHALIC ARTERIOVENOUS (AV)  LEFT ARM FISTULA CREATION;  Surgeon: Cephus Shelling, MD;  Location: [facility] OR;  Service: Vascular;  Laterality: Left;   AV FISTULA PLACEMENT Right 05/24/2022   Procedure: CREATION OF ARTERIOVENOUS (AV) RIGHT BRACHIOCEPHALIC FIRST STAGE FISTULA;  Surgeon: Victorino Sparrow, MD;  Location: [facility] OR;  Service: Vascular;  Laterality: Right;   BASCILIC VEIN TRANSPOSITION Right 08/02/2022   Procedure: RIGHT ARM A-V FISTUALA BASILIC VEIN TRANSPOSITION;  Surgeon: Victorino Sparrow, MD;  Location: [facility] OR;  Service: Vascular;  Laterality: Right;   IR FLUORO GUIDE CV LINE RIGHT  08/05/2021   IR US GUIDE VASC ACCESS RIGHT  08/05/2021   RIGHT/LEFT HEART CATH AND CORONARY  ANGIOGRAPHY N/A 04/16/2019   Procedure: RIGHT/LEFT HEART CATH AND CORONARY ANGIOGRAPHY;  Surgeon: Dolores Patty, MD;  Location: [facility] INVASIVE CV LAB;  Service: Cardiovascular;  Laterality: N/A;   Family History  Problem Relation Age of Onset   Diabetes Mother    Diabetes Father    Congestive Heart Failure Sister    Alcohol abuse Sister    Heart failure Mother    Heart failure Brother    Heart attack Brother    Social History   Socioeconomic History   Marital status: Divorced    Spouse name: Not on file   Number of children: Not on file   Years of education: Not on file   Highest education level: Not on file  Occupational History   Not on file  Tobacco Use   Smoking status: Never   Smokeless tobacco: Never  Vaping Use  Vaping status: Never Used  Substance and Sexual Activity   Alcohol use: Never   Drug use: No   Sexual activity: Not on file  Other Topics Concern   Not on file  Social History Narrative   ** Merged History Encounter **       Lives with son. Previously worked for city of [employer].    Social Determinants of Health   Financial Resource Strain: Low Risk  (01/28/2023)   Overall Financial Resource Strain (CARDIA)    Difficulty of Paying Living Expenses: Not hard at all  Food Insecurity: No Food Insecurity (01/28/2023)   Hunger Vital Sign    Worried About Running Out of Food in the Last Year: Never true    Ran Out of Food in the Last Year: Never true  Transportation Needs: Unmet Transportation Needs (01/28/2023)   PRAPARE - Transportation    Lack of Transportation (Medical): Yes    Lack of Transportation (Non-Medical): Yes  Physical Activity: Sufficiently Active (01/28/2023)   Exercise Vital Sign    Days of Exercise per Week: 5 days    Minutes of Exercise per Session: 30 min  Stress: No Stress Concern Present (01/28/2023)   [employer] of Occupational Health - Occupational Stress Questionnaire    Feeling of Stress : Not at all  Social  Connections: Moderately Isolated (01/28/2023)   Social Connection and Isolation Panel [NHANES]    Frequency of Communication with Friends and Family: More than three times a week    Frequency of Social Gatherings with Friends and Family: Three times a week    Attends Religious Services: 1 to 4 times per year    Active Member of Clubs or Organizations: No    Attends Banker Meetings: Never    Marital Status: Divorced    Tobacco Counseling Counseling given: Not Answered   Clinical Intake:  Pre-visit preparation completed: Yes  Pain : No/denies pain     Diabetes: Yes CBG done?: No Did pt. bring in CBG monitor from home?: No  How often do you need to have someone help you when you read instructions, pamphlets, or other written materials from your doctor or pharmacy?: 1 - Never  Interpreter Needed?: No  Information entered by :: Kandis Fantasia LPN   Activities of Daily Living    01/28/2023   11:50 AM 08/02/2022    6:14 AM  In your present state of health, do you have any difficulty performing the following activities:  Hearing? 0   Vision? 0   Difficulty concentrating or making decisions? 0   Walking or climbing stairs? 0   Dressing or bathing? 0   Doing errands, shopping? 0 0  Preparing Food and eating ? N   Using the Toilet? N   In the past six months, have you accidently leaked urine? N   Do you have problems with loss of bowel control? N   Managing your Medications? N   Managing your Finances? N   Housekeeping or managing your Housekeeping? N     Patient Care Team: Lincoln Brigham, MD as PCP - General (Family Medicine) Corky Crafts, MD as PCP - Cardiology (Cardiology) Nadara Mustard, MD as Consulting Physician (Orthopedic Surgery)  Indicate any recent Medical Services you may have received from other than [facility] providers in the past year (date may be approximate).     Assessment:   This is a routine wellness examination for  [place].  Hearing/Vision screen Hearing Screening - Comments:: Denies hearing difficulties  Vision Screening - Comments:: No vision problems; [place] schedule routine eye exam soon     Goals Addressed             This Visit's Progress    Remain active and independent        Depression Screen    01/28/2023    2:34 PM 10/16/2020   10:22 AM 10/08/2020    9:40 AM 11/29/2019    8:42 AM  PHQ 2/9 Scores  PHQ - 2 Score 0 0 0 0  PHQ- 9 Score  0 1     Fall Risk    01/28/2023    2:39 PM 06/17/2021    1:33 PM 10/16/2020   10:22 AM 10/08/2020    9:40 AM 10/06/2020    3:44 PM  Fall Risk   Falls in the past year? 0 0 1 0 0  Number falls in past yr: 0 0 0 0 0  Injury with Fall? 0 0 0 0 0  Risk for fall due to : No Fall Risks      Follow up Falls prevention discussed;Education provided;Falls evaluation completed        MEDICARE RISK AT HOME: Medicare Risk at Home Any stairs in or around the home?: No If so, are there any without handrails?: No Home free of loose throw rugs in walkways, pet beds, electrical cords, etc?: Yes Adequate lighting in your home to reduce risk of falls?: Yes Life alert?: No Use of a cane, walker or w/c?: No Grab bars in the bathroom?: Yes Shower chair or bench in shower?: Yes Elevated toilet seat or a handicapped toilet?: Yes  TIMED UP AND GO:  Was the test performed? No    Cognitive Function:        01/28/2023    2:41 PM  6CIT Screen  What Year? 0 points  What month? 0 points  What time? 0 points  Count back from 20 0 points  Months in reverse 2 points  Repeat phrase 2 points  Total Score 4 points    Immunizations Immunization History  Administered Date(s) Administered   Fluad Quad(high Dose 65+) 04/24/2021   Hepb-cpg 10/01/2021, 12/10/2021, 12/31/2021, 04/01/2022   Influenza, Quadrivalent, Recombinant, Inj, Pf 02/23/2022   PFIZER(Purple Top)SARS-COV-2 Vaccination 07/15/2019, 08/14/2019, 02/20/2020   PNEUMOCOCCAL CONJUGATE-20 09/14/2022    Pneumococcal Polysaccharide-23 11/29/2019    TDAP status: Due, Education has been provided regarding the importance of this vaccine. Advised may receive this vaccine at local pharmacy or Health Dept. Aware to provide a copy of the vaccination record if obtained from local pharmacy or Health Dept. Verbalized acceptance and understanding.  Flu Vaccine status: Due, Education has been provided regarding the importance of this vaccine. Advised may receive this vaccine at local pharmacy or Health Dept. Aware to provide a copy of the vaccination record if obtained from local pharmacy or Health Dept. Verbalized acceptance and understanding.  Pneumococcal vaccine status: Up to date  Covid-19 vaccine status: Information provided on how to obtain vaccines.   Qualifies for Shingles Vaccine? Yes   Zostavax completed No   Shingrix Completed?: No.    Education has been provided regarding the importance of this vaccine. Patient has been advised to call insurance company to determine out of pocket expense if they have not yet received this vaccine. Advised may also receive vaccine at local pharmacy or Health Dept. Verbalized acceptance and understanding.  Screening Tests Health Maintenance  Topic Date Due   OPHTHALMOLOGY EXAM  Never done   DTaP/Tdap/Td (  1 - Tdap) Never done   Colonoscopy  Never done   Zoster Vaccines- Shingrix (1 of 2) Never done   FOOT EXAM  11/28/2020   HEMOGLOBIN A1C  04/24/2022   INFLUENZA VACCINE  12/16/2022   COVID-19 Vaccine (4 - 2023-24 season) 01/16/2023   Medicare Annual Wellness (AWV)  01/28/2024   Pneumonia Vaccine 73+ Years old  Completed   Hepatitis C Screening  Completed   HPV VACCINES  Aged Out    Health Maintenance  Health Maintenance Due  Topic Date Due   OPHTHALMOLOGY EXAM  Never done   DTaP/Tdap/Td (1 - Tdap) Never done   Colonoscopy  Never done   Zoster Vaccines- Shingrix (1 of 2) Never done   FOOT EXAM  11/28/2020   HEMOGLOBIN A1C  04/24/2022    INFLUENZA VACCINE  12/16/2022   COVID-19 Vaccine (4 - 2023-24 season) 01/16/2023    Colorectal cancer screening:  Patient declines at this time   Lung Cancer Screening: (Low Dose CT Chest recommended if Age 69-80 years, 20 pack-year currently smoking OR have quit w/in 15years.) does not qualify.   Lung Cancer Screening Referral: n/a  Additional Screening:  Hepatitis C Screening: does qualify; Completed 04/13/19  Vision Screening: Recommended annual ophthalmology exams for early detection of glaucoma and other disorders of the eye. Is the patient up to date with their annual eye exam?  No  Who is the provider or what is the name of the office in which the patient attends annual eye exams? none If pt is not established with a provider, would they like to be referred to a provider to establish care? No .   Dental Screening: Recommended annual dental exams for proper oral hygiene  Diabetic Foot Exam: Diabetic Foot Exam: Overdue, Pt has been advised about the importance in completing this exam. Pt is scheduled for diabetic foot exam on at next office visit.  Community Resource Referral / Chronic Care Management: CRR required this visit?  No   CCM required this visit?  No    Plan:     I have personally reviewed and noted the following in the patient's chart:   Medical and social history Use of alcohol, tobacco or illicit drugs  Current medications and supplements including opioid prescriptions. Patient is not currently taking opioid prescriptions. Functional ability and status Nutritional status Physical activity Advanced directives List of other physicians Hospitalizations, surgeries, and ER visits in previous 12 months Vitals Screenings to include cognitive, depression, and falls Referrals and appointments  In addition, I have reviewed and discussed with patient certain preventive protocols, quality metrics, and best practice recommendations. A written personalized care  plan for preventive services as well as general preventive health recommendations were provided to patient.     Kandis Fantasia [place], [place]   1/61/0960   After Visit Summary: (Mail) Due to this being a telephonic visit, the after visit summary with patients personalized plan was offered to patient via mail   Nurse Notes: Patient is having problems with medications and refills.  Referral for assistance by pharmacist.

## 2023-01-31 ENCOUNTER — Other Ambulatory Visit: Payer: Self-pay

## 2023-03-24 ENCOUNTER — Other Ambulatory Visit: Payer: Self-pay

## 2023-03-24 ENCOUNTER — Other Ambulatory Visit (HOSPITAL_COMMUNITY): Payer: Self-pay

## 2023-03-24 MED ORDER — TAMSULOSIN HCL 0.4 MG PO CAPS
0.4000 mg | ORAL_CAPSULE | Freq: Every evening | ORAL | 11 refills | Status: DC
  Filled 2023-03-24: qty 30, 30d supply, fill #0

## 2023-04-21 ENCOUNTER — Telehealth: Payer: Self-pay

## 2023-04-21 NOTE — Telephone Encounter (Signed)
Received VM from patient's wife, Sheralyn Boatman, requesting samples on Eliquis.   Attempted to return call to wife to discuss further.   She did not answer, LVM asking that she return call to office.   Veronda Prude, RN

## 2023-04-22 NOTE — Telephone Encounter (Signed)
Contacted patient's spouse and agreed with plan to provide sample.  Spouse reports patient was supposed to be on a zero deductible plan.  Unsure how to resolve, asked patient's spouse to contact insurance company directly.    Medication Samples have been provided for patient pick-up later today or MONDAY .  Drug name: Eliquis (apixaban)       Strength: 5mg         Qty: 56 tablets (28 day supply)  LOT: [id number]  Exp.Date: 06/16/2024  Dosing instructions: 1 tablet twice daily  The patient has been instructed regarding the correct time, dose, and frequency of taking this medication, including desired effects and most common side effects.   Madelon Lips 10:56 AM 04/22/2023

## 2023-04-22 NOTE — Telephone Encounter (Signed)
Patient's wife returns call to nurse line. She reports that insurance is not covering Eliquis and is asking for samples.   Advised that I would send message to pharmacy team.   Veronda Prude, RN

## 2023-04-22 NOTE — Telephone Encounter (Signed)
Reviewed and agree with Dr Koval's plan.   

## 2023-04-25 ENCOUNTER — Other Ambulatory Visit (HOSPITAL_COMMUNITY): Payer: Self-pay

## 2023-05-16 ENCOUNTER — Other Ambulatory Visit (HOSPITAL_COMMUNITY): Payer: Self-pay

## 2023-05-16 ENCOUNTER — Other Ambulatory Visit: Payer: Self-pay

## 2023-05-16 MED ORDER — CEFPODOXIME PROXETIL 100 MG PO TABS
100.0000 mg | ORAL_TABLET | Freq: Every day | ORAL | 0 refills | Status: DC
  Filled 2023-05-16 (×2): qty 7, 7d supply, fill #0

## 2023-05-17 ENCOUNTER — Other Ambulatory Visit: Payer: Self-pay

## 2023-05-31 ENCOUNTER — Other Ambulatory Visit: Payer: Self-pay

## 2023-05-31 MED ORDER — CEFPODOXIME PROXETIL 100 MG PO TABS: 100.0000 mg | ORAL_TABLET | Freq: Every day | ORAL | 0 refills | Status: DC

## 2023-06-06 ENCOUNTER — Encounter (HOSPITAL_COMMUNITY)
Admission: RE | Disposition: A | Discharge status: Home / Self Care | Payer: Self-pay | Source: Home / Self Care | Attending: Nephrology

## 2023-06-06 ENCOUNTER — Other Ambulatory Visit: Payer: Self-pay

## 2023-06-06 ENCOUNTER — Ambulatory Visit (HOSPITAL_COMMUNITY)
Admission: RE | Admit: 2023-06-06 | Discharge: 2023-06-06 | Disposition: A | Discharge status: Home / Self Care | Payer: Medicare Other | Attending: Nephrology | Admitting: Nephrology

## 2023-06-06 DIAGNOSIS — Y832 Surgical operation with anastomosis, bypass or graft as the cause of abnormal reaction of the patient, or of later complication, without mention of misadventure at the time of the procedure: Secondary | ICD-10-CM | POA: Insufficient documentation

## 2023-06-06 DIAGNOSIS — T82858A Stenosis of vascular prosthetic devices, implants and grafts, initial encounter: Secondary | ICD-10-CM | POA: Insufficient documentation

## 2023-06-06 DIAGNOSIS — N186 End stage renal disease: Secondary | ICD-10-CM | POA: Diagnosis not present

## 2023-06-06 DIAGNOSIS — I5022 Chronic systolic (congestive) heart failure: Secondary | ICD-10-CM | POA: Insufficient documentation

## 2023-06-06 DIAGNOSIS — E1122 Type 2 diabetes mellitus with diabetic chronic kidney disease: Secondary | ICD-10-CM | POA: Insufficient documentation

## 2023-06-06 DIAGNOSIS — Z992 Dependence on renal dialysis: Secondary | ICD-10-CM | POA: Insufficient documentation

## 2023-06-06 DIAGNOSIS — I132 Hypertensive heart and chronic kidney disease with heart failure and with stage 5 chronic kidney disease, or end stage renal disease: Secondary | ICD-10-CM | POA: Insufficient documentation

## 2023-06-06 DIAGNOSIS — I4891 Unspecified atrial fibrillation: Secondary | ICD-10-CM | POA: Insufficient documentation

## 2023-06-06 DIAGNOSIS — D631 Anemia in chronic kidney disease: Secondary | ICD-10-CM | POA: Insufficient documentation

## 2023-06-06 HISTORY — PX: PERIPHERAL VASCULAR BALLOON ANGIOPLASTY: CATH118281

## 2023-06-06 HISTORY — PX: A/V FISTULAGRAM: CATH118298

## 2023-06-06 SURGERY — A/V FISTULAGRAM
Anesthesia: LOCAL | Laterality: Right

## 2023-06-06 MED ORDER — LIDOCAINE HCL (PF) 1 % IJ SOLN
INTRAMUSCULAR | Status: DC | PRN
  Administered 2023-06-06: 2 mL via INTRADERMAL

## 2023-06-06 MED ORDER — MIDAZOLAM HCL 2 MG/2ML IJ SOLN
INTRAMUSCULAR | Status: AC
  Filled 2023-06-06: qty 2

## 2023-06-06 MED ORDER — LIDOCAINE HCL (PF) 1 % IJ SOLN
INTRAMUSCULAR | Status: AC
  Filled 2023-06-06: qty 30

## 2023-06-06 MED ORDER — MIDAZOLAM HCL 2 MG/2ML IJ SOLN
INTRAMUSCULAR | Status: DC | PRN
  Administered 2023-06-06: 1 mg via INTRAVENOUS

## 2023-06-06 MED ORDER — SODIUM CHLORIDE 0.9 % IV SOLN: INTRAVENOUS | Status: DC

## 2023-06-06 MED ORDER — FENTANYL CITRATE (PF) 100 MCG/2ML IJ SOLN
INTRAMUSCULAR | Status: DC | PRN
  Administered 2023-06-06: 25 ug via INTRAVENOUS

## 2023-06-06 MED ORDER — FENTANYL CITRATE (PF) 100 MCG/2ML IJ SOLN
INTRAMUSCULAR | Status: AC
  Filled 2023-06-06: qty 2

## 2023-06-06 MED ORDER — HEPARIN (PORCINE) IN NACL 1000-0.9 UT/500ML-% IV SOLN
INTRAVENOUS | Status: DC | PRN
  Administered 2023-06-06: 500 mL

## 2023-06-06 MED ORDER — IODIXANOL 320 MG/ML IV SOLN
INTRAVENOUS | Status: DC | PRN
  Administered 2023-06-06: 8 mL via INTRAVENOUS

## 2023-06-06 SURGICAL SUPPLY — 10 items
BAG SNAP BAND KOVER 36X36 (MISCELLANEOUS) ×2 IMPLANT
BALLN ATHLETIS 7X40X75 (BALLOONS) ×2 IMPLANT
BALLOON ATHLETIS 7X40X75 (BALLOONS) IMPLANT
CATH SLIP KMP 65CM 5FR (CATHETERS) IMPLANT
COVER DOME SNAP 22 D (MISCELLANEOUS) ×2 IMPLANT
GUIDEWIRE ANGLED .035X150CM (WIRE) IMPLANT
KIT MICROPUNCTURE NIT STIFF (SHEATH) IMPLANT
SHEATH PINNACLE R/O II 6F 4CM (SHEATH) IMPLANT
SYR MEDALLION 10ML (SYRINGE) IMPLANT
TRAY PV CATH (CUSTOM PROCEDURE TRAY) IMPLANT

## 2023-06-06 NOTE — Op Note (Signed)
Patient presents for concerns of decreased access flows of his right BBT which was created in January 2024 and transposed in March, 2024.  His last endovascular procedure was 8 mm proximal swing site angioplasty performed at [facility] in July 2024.  On the physical exam, the fistula is hyperpulsatile at the inflow with a poorly palpable thrill in the outflow.  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  2)      Flows improved after outflow angioplasty.  Patent body of the fistula, proximal swing site, right axillary and central veins. 3)      This left BBT remains amenable to future percutaneous interventions.  Description of procedure: The arm was prepped and draped in the usual sterile fashion. The left upper arm brachial basilic fistula was cannulated (82956) with a 21-gauge micropuncture needle directed in a retrograde direction in venous limb of the fistula. A guidewire was inserted and exchanged for a 6 Fr sheath. Contrast [id number]) injection via the side port of the sheath was performed. The angiogram of the fistula (65784) showed a patent outflow basilic swing site; the axillary vein, centrals, cannulation zone. There was a 90% inflow basilic vein stenosis with patent arterial anastomosis.  The angled Glidewire was advanced and manipulated until the tip of the wire was in the proximal brachial artery.  Arteriogram revealed a patent brachial artery and arterial anastomosis with a focal 90% inflow basilic vein stenosis. A 7 x 4 Athletis angioplasty balloon was then inserted over the guidewire and positioned at the basilic vein inflow stenosis.   Venous angioplasty (69629) was carried out to 18 ATM with FULL effacement of the waist on the balloon at the basilic inflow swing site lesion. The repeat angiogram showed 10% residual stenosis with no  evidence of extravasation or dissection.   Hemostasis: A 3-0 ethilon purse string suture was placed at the cannulation site on removal of the sheath.  Sedation: 1mg  Versed, Fentanyl. Sedation time. 14 minutes  Contrast. 8 mL  Monitoring: Because of the patient's comorbid conditions and sedation during the procedure, continuous EKG monitoring and O2 saturation monitoring was performed throughout the procedure by the RN. There were no abnormal arrhythmias encountered.  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 Continue to cannulate the fistula with 15G needles.  2. Refer for problems with flows/swelling. 3. Remove the suture next treatment.   Discharge: The patient was discharged home in stable condition. The patient was given education regarding the care of the dialysis access AVF and specific instructions in case of any problems.

## 2023-06-06 NOTE — Discharge Instructions (Signed)
Okay to discharge home anytime after 9:45 AM as long as clinically stable.

## 2023-06-06 NOTE — H&P (Signed)
Lucas Ziya Vereb. is an 73 y.o. male.   Chief Complaint: Decreased access flow HPI:  73 year old man with past medical history significant for hypertension, HFrEF, type 2 diabetes mellitus, atrial fibrillation and end-stage renal disease on hemodialysis.  He has a right brachiocephalic fistula that was created in January 2024 with transposition in March 2024.  His last endovascular procedure was in July 2024 when he had 8 mm proximal swing site angioplasty.  He denies any physical problems with his fistula and has not had any episodes of prolonged bleeding.  He denies any chest pain or shortness of breath and continues to enjoy good appetite and good energy.  Denies any fevers or chills.  I discussed the angiogram/angioplasty procedure with the patient and he is willing to proceed.  Consent form signed.  Past Medical History:  Diagnosis Date   Acute respiratory failure with hypoxia (HCC) 07/28/2021   Atrial fibrillation (HCC)    Cardiac arrest (HCC) 07/27/2021   CHF (congestive heart failure) (HCC)    Diabetes mellitus without complication (HCC)    Dysrhythmia    Heart failure with reduced ejection fraction (HCC)    Hypertension    Hyponatremia 04/14/2019   NSVT (nonsustained ventricular tachycardia) (HCC) 04/14/2019   Pneumonia 07/2021   Renal insufficiency 04/14/2019    Past Surgical History:  Procedure Laterality Date   A/V FISTULAGRAM Left 02/24/2022   Procedure: A/V Fistulagram;  Surgeon: Victorino Sparrow, MD;  Location: [facility] INVASIVE CV LAB;  Service: Cardiovascular;  Laterality: Left;   AV FISTULA PLACEMENT Left 08/17/2021   Procedure: RADIOCEPHALIC ARTERIOVENOUS (AV)  LEFT ARM FISTULA CREATION;  Surgeon: Cephus Shelling, MD;  Location: [facility] OR;  Service: Vascular;  Laterality: Left;   AV FISTULA PLACEMENT Right 05/24/2022   Procedure: CREATION OF ARTERIOVENOUS (AV) RIGHT BRACHIOCEPHALIC FIRST STAGE FISTULA;  Surgeon: Victorino Sparrow, MD;  Location: [facility] OR;  Service: Vascular;   Laterality: Right;   BASCILIC VEIN TRANSPOSITION Right 08/02/2022   Procedure: RIGHT ARM A-V FISTUALA BASILIC VEIN TRANSPOSITION;  Surgeon: Victorino Sparrow, MD;  Location: [facility] OR;  Service: Vascular;  Laterality: Right;   IR FLUORO GUIDE CV LINE RIGHT  08/05/2021   IR US GUIDE VASC ACCESS RIGHT  08/05/2021   RIGHT/LEFT HEART CATH AND CORONARY ANGIOGRAPHY N/A 04/16/2019   Procedure: RIGHT/LEFT HEART CATH AND CORONARY ANGIOGRAPHY;  Surgeon: Dolores Patty, MD;  Location: [facility] INVASIVE CV LAB;  Service: Cardiovascular;  Laterality: N/A;    Family History  Problem Relation Age of Onset   Diabetes Mother    Diabetes Father    Congestive Heart Failure Sister    Alcohol abuse Sister    Heart failure Mother    Heart failure Brother    Heart attack Brother    Social History:  reports that he has never smoked. He has never used smokeless tobacco. He reports that he does not drink alcohol and does not use drugs.  Allergies:  Allergies  Allergen Reactions   Lisinopril Swelling    Angioedema to lisinopril documented in chart. Time of reaction unknown.    Other Other (See Comments)    Seasonal allergies    Facility-Administered Medications Prior to Admission  Medication Dose Route Frequency Provider Last Rate Last Admin   0.9 %  sodium chloride infusion  250 mL Intravenous PRN Chuck Hint, MD       sodium chloride flush (NS) 0.9 % injection 3 mL  3 mL Intravenous PRN Chuck Hint, MD  Medications Prior to Admission  Medication Sig Dispense Refill   amiodarone (PACERONE) 200 MG tablet Take 1 tablet (200 mg total) by mouth 2 (two) times daily. 60 tablet 6   amiodarone (PACERONE) 200 MG tablet Take 1 tablet (200 mg total) by mouth daily. 90 tablet 3   amLODipine (NORVASC) 2.5 MG tablet Take 1 tablet (2.5 mg total) by mouth Nightly. 90 tablet 3   apixaban (ELIQUIS) 5 MG TABS tablet Take 1 tablet (5 mg total) by mouth 2 (two) times daily. 60 tablet 5   Ascorbic Acid  (VITAMIN C PO) Take 1 tablet by mouth daily.     b complex vitamins tablet Take 1 tablet by mouth daily.     B Complex-C-Zn-Folic Acid (DIALYVITE/ZINC) TABS Take 1 tablet by mouth daily.     Biotin 1000 MCG tablet Take 1,000 mcg by mouth daily.     blood glucose meter kit and supplies KIT Dispense based on patient and insurance preference. Use to check blood sugars at least twice a day --before meals and/or at bedtime. 1 each 0   carvedilol (COREG) 25 MG tablet Take 1 tablet (25 mg total) by mouth 2 (two) times daily. 180 tablet 3   cefpodoxime (VANTIN) 100 MG tablet Take 1 tablet by mouth once a day 14 tablet 0   Cholecalciferol (VITAMIN D3) 50 MCG (2000 UT) capsule Take 2,000 Units by mouth daily.     Continuous Blood Gluc Sensor (FREESTYLE LIBRE 14 DAY SENSOR) MISC 1 application. by Does not apply route every 14 (fourteen) days. 2 each 3   Garlic 1000 MG CAPS Take 1,000 mg by mouth daily.     Menthol-Methyl Salicylate (MUSCLE RUB) 10-15 % CREA Apply 1 application. topically as needed for muscle pain. 85 g 0   Methoxy PEG-Epoetin Beta (MIRCERA IJ) Mircera     multivitamin (RENA-VIT) TABS tablet TAKE 1 TABLET BY MOUTH AT BEDTIME 30 tablet 0   OVER THE COUNTER MEDICATION Take 800 mg by mouth daily. Kale     oxyCODONE-acetaminophen (PERCOCET) 5-325 MG tablet Take 1 tablet by mouth every 4 (four) hours as needed for severe pain. 15 tablet 0   Prasterone, DHEA, (DHEA PO) Take 1 capsule by mouth daily.     RENVELA 800 MG tablet Take by mouth.     saw palmetto 160 MG capsule Take 160 mg by mouth daily.     tamsulosin (FLOMAX) 0.4 MG CAPS capsule Take 1 capsule (0.4 mg total) by mouth Nightly stop if dizziness. 30 capsule 11    No results found for this or any previous visit (from the past 48 hours). No results found.  Review of Systems  All other systems reviewed and are negative.   Blood pressure (!) 160/95, pulse 77, resp. rate 20, height 5\' 9"  (1.753 m), weight 84.8 kg, SpO2 100%. Physical  Exam Vitals and nursing note reviewed.  Constitutional:      Appearance: Normal appearance. He is normal weight.  HENT:     Head: Normocephalic and atraumatic.     Right Ear: External ear normal.     Left Ear: External ear normal.     Nose: Nose normal.     Mouth/Throat:     Mouth: Mucous membranes are dry.     Pharynx: Oropharynx is clear.  Eyes:     Extraocular Movements: Extraocular movements intact.     Conjunctiva/sclera: Conjunctivae normal.  Cardiovascular:     Rate and Rhythm: Normal rate. Rhythm irregular.     Pulses:  Normal pulses.     Heart sounds: Normal heart sounds.  Pulmonary:     Effort: Pulmonary effort is normal.     Breath sounds: Normal breath sounds.  Abdominal:     General: Abdomen is flat. Bowel sounds are normal.     Palpations: Abdomen is soft.  Musculoskeletal:        General: Normal range of motion.     Cervical back: Normal range of motion.     Right lower leg: No edema.     Left lower leg: No edema.     Comments: Right upper arm AV fistula with poor outflow thrill, suboptimal augmentation and pulsatile inflow  Skin:    General: Skin is warm and dry.  Neurological:     Mental Status: He is alert and oriented to person, place, and time.      Assessment/Plan 1.  Decreased access flows: Physical exam supports inflow stenosis and angiogram/arteriogram Oracio be done today to evaluate for etiology and offer management.  Consent obtained from patient. 2.  End-stage renal disease: Resume hemodialysis on TTS schedule following procedure today. 3.  Hypertension: Blood pressure within acceptable range and Jeryn be monitored through procedure with ongoing sedation. 4.  Anemia of chronic disease: Resume ESA with dialysis.  Dagoberto Ligas, MD 06/06/2023, 8:44 AM

## 2023-06-07 ENCOUNTER — Encounter (HOSPITAL_COMMUNITY): Payer: Self-pay | Admitting: Nephrology

## 2023-06-14 ENCOUNTER — Other Ambulatory Visit: Payer: Self-pay

## 2023-06-14 ENCOUNTER — Other Ambulatory Visit (HOSPITAL_COMMUNITY): Payer: Self-pay

## 2023-06-14 MED ORDER — CEFPODOXIME PROXETIL 100 MG PO TABS
100.0000 mg | ORAL_TABLET | Freq: Every day | ORAL | 0 refills | Status: DC
  Filled 2023-06-14 (×3): qty 14, 14d supply, fill #0

## 2023-06-14 MED ORDER — TAMSULOSIN HCL 0.4 MG PO CAPS
0.4000 mg | ORAL_CAPSULE | Freq: Every evening | ORAL | 3 refills | Status: DC
  Filled 2023-06-23: qty 90, 90d supply, fill #0

## 2023-06-15 ENCOUNTER — Other Ambulatory Visit: Payer: Self-pay

## 2023-06-23 ENCOUNTER — Other Ambulatory Visit (HOSPITAL_COMMUNITY): Payer: Self-pay

## 2023-06-23 ENCOUNTER — Other Ambulatory Visit: Payer: Self-pay

## 2023-06-24 ENCOUNTER — Other Ambulatory Visit: Payer: Self-pay

## 2023-06-28 ENCOUNTER — Other Ambulatory Visit (HOSPITAL_COMMUNITY): Payer: Self-pay

## 2023-06-30 ENCOUNTER — Other Ambulatory Visit (HOSPITAL_COMMUNITY): Payer: Self-pay

## 2023-08-03 ENCOUNTER — Telehealth: Payer: Self-pay

## 2023-08-03 NOTE — Telephone Encounter (Signed)
 Patient's wife returns call to nurse line regarding death certificate.   Spoke with PCP and preceptor regarding next steps.   Jahan forward to Dr. Pollie Meyer (Advisor) to sign death certificate.   Wife is asking that we call her once this has been completed so they can proceed with funeral arrangements.   Veronda Prude, RN

## 2023-08-03 NOTE — Telephone Encounter (Signed)
 Patients spouse calls nurse line to report patient passing.   She reports he found dead on the morning of 08-17-23. She reports he found in his bed and presumably passed in his sleep.  She reports the death certificate needs to be signed off on so the family can proceed with funeral arrangements.   Condolences to the family.   Saurabh forward to PCP.

## 2023-08-04 NOTE — Telephone Encounter (Signed)
 This death certificate has not been assigned to me in [place] and I am not able to locate it there by searching. The funeral home needs to assign me as the medical certifier who Ashir complete the death certificate, and then I'll be happy to do it.  Can you guys reach out to family/funeral home to get this done?  Thanks! Latrelle Dodrill, MD

## 2023-08-05 NOTE — Telephone Encounter (Signed)
 Death certificate completed Lucas Dodrill, MD

## 2023-08-16 DEATH — deceased
